# Patient Record
Sex: Female | Born: 1990 | Race: White | Hispanic: No | Marital: Single | Smoking: Never smoker
Health system: Southern US, Community
[De-identification: ages and names within clinical notes are randomized; demographics above are authoritative.]

## PROBLEM LIST (undated history)

## (undated) ENCOUNTER — Inpatient Hospital Stay (HOSPITAL_COMMUNITY): Payer: Self-pay

## (undated) DIAGNOSIS — K602 Anal fissure, unspecified: Secondary | ICD-10-CM

## (undated) DIAGNOSIS — G43909 Migraine, unspecified, not intractable, without status migrainosus: Secondary | ICD-10-CM

## (undated) DIAGNOSIS — F32A Depression, unspecified: Secondary | ICD-10-CM

## (undated) DIAGNOSIS — R110 Nausea: Secondary | ICD-10-CM

## (undated) DIAGNOSIS — F909 Attention-deficit hyperactivity disorder, unspecified type: Secondary | ICD-10-CM

## (undated) DIAGNOSIS — Z3490 Encounter for supervision of normal pregnancy, unspecified, unspecified trimester: Secondary | ICD-10-CM

## (undated) DIAGNOSIS — E669 Obesity, unspecified: Secondary | ICD-10-CM

## (undated) DIAGNOSIS — B9689 Other specified bacterial agents as the cause of diseases classified elsewhere: Secondary | ICD-10-CM

## (undated) DIAGNOSIS — R197 Diarrhea, unspecified: Secondary | ICD-10-CM

## (undated) DIAGNOSIS — Z309 Encounter for contraceptive management, unspecified: Secondary | ICD-10-CM

## (undated) DIAGNOSIS — K649 Unspecified hemorrhoids: Secondary | ICD-10-CM

## (undated) DIAGNOSIS — K219 Gastro-esophageal reflux disease without esophagitis: Secondary | ICD-10-CM

## (undated) DIAGNOSIS — Z8619 Personal history of other infectious and parasitic diseases: Secondary | ICD-10-CM

## (undated) DIAGNOSIS — M549 Dorsalgia, unspecified: Secondary | ICD-10-CM

## (undated) DIAGNOSIS — Z98891 History of uterine scar from previous surgery: Secondary | ICD-10-CM

## (undated) DIAGNOSIS — F419 Anxiety disorder, unspecified: Secondary | ICD-10-CM

## (undated) DIAGNOSIS — N760 Acute vaginitis: Secondary | ICD-10-CM

## (undated) DIAGNOSIS — F329 Major depressive disorder, single episode, unspecified: Secondary | ICD-10-CM

## (undated) DIAGNOSIS — G4730 Sleep apnea, unspecified: Secondary | ICD-10-CM

## (undated) DIAGNOSIS — N898 Other specified noninflammatory disorders of vagina: Secondary | ICD-10-CM

## (undated) DIAGNOSIS — G8929 Other chronic pain: Secondary | ICD-10-CM

## (undated) HISTORY — DX: Acute vaginitis: N76.0

## (undated) HISTORY — DX: Unspecified hemorrhoids: K64.9

## (undated) HISTORY — DX: Personal history of other infectious and parasitic diseases: Z86.19

## (undated) HISTORY — DX: Anxiety disorder, unspecified: F41.9

## (undated) HISTORY — DX: Nausea: R11.0

## (undated) HISTORY — DX: Attention-deficit hyperactivity disorder, unspecified type: F90.9

## (undated) HISTORY — PX: TONSILLECTOMY: SUR1361

## (undated) HISTORY — DX: Obesity, unspecified: E66.9

## (undated) HISTORY — DX: Gastro-esophageal reflux disease without esophagitis: K21.9

## (undated) HISTORY — PX: HERNIA REPAIR: SHX51

## (undated) HISTORY — DX: Sleep apnea, unspecified: G47.30

## (undated) HISTORY — DX: Encounter for contraceptive management, unspecified: Z30.9

## (undated) HISTORY — DX: Other specified noninflammatory disorders of vagina: N89.8

## (undated) HISTORY — DX: History of uterine scar from previous surgery: Z98.891

## (undated) HISTORY — DX: Major depressive disorder, single episode, unspecified: F32.9

## (undated) HISTORY — DX: Other specified bacterial agents as the cause of diseases classified elsewhere: B96.89

## (undated) HISTORY — DX: Diarrhea, unspecified: R19.7

## (undated) HISTORY — DX: Depression, unspecified: F32.A

## (undated) HISTORY — DX: Anal fissure, unspecified: K60.2

## (undated) HISTORY — DX: Encounter for supervision of normal pregnancy, unspecified, unspecified trimester: Z34.90

## (undated) HISTORY — PX: WISDOM TOOTH EXTRACTION: SHX21

---
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
## 2010-08-07 ENCOUNTER — Inpatient Hospital Stay (HOSPITAL_COMMUNITY)
Admission: AD | Admit: 2010-08-07 | Discharge: 2010-08-07 | Payer: Self-pay | Source: Home / Self Care | Attending: Obstetrics and Gynecology | Admitting: Obstetrics and Gynecology

## 2010-08-11 ENCOUNTER — Encounter: Payer: Self-pay | Admitting: Obstetrics & Gynecology

## 2010-08-11 ENCOUNTER — Encounter: Payer: Self-pay | Admitting: Internal Medicine

## 2010-08-18 ENCOUNTER — Inpatient Hospital Stay (HOSPITAL_COMMUNITY)
Admission: AD | Admit: 2010-08-18 | Discharge: 2010-08-18 | Payer: Self-pay | Source: Home / Self Care | Attending: Obstetrics & Gynecology | Admitting: Obstetrics & Gynecology

## 2010-08-18 LAB — URINALYSIS, ROUTINE W REFLEX MICROSCOPIC
Specific Gravity, Urine: 1.02 (ref 1.005–1.030)
Urine Glucose, Fasting: NEGATIVE mg/dL
pH: 7 (ref 5.0–8.0)

## 2010-08-18 LAB — URINE MICROSCOPIC-ADD ON

## 2010-08-21 ENCOUNTER — Ambulatory Visit (HOSPITAL_COMMUNITY)
Admission: RE | Admit: 2010-08-21 | Discharge: 2010-08-21 | Disposition: A | Discharge status: Home / Self Care | Payer: Medicaid Other | Source: Ambulatory Visit | Attending: Obstetrics and Gynecology | Admitting: Obstetrics and Gynecology

## 2010-08-21 ENCOUNTER — Other Ambulatory Visit: Payer: Self-pay | Admitting: Obstetrics and Gynecology

## 2010-08-21 ENCOUNTER — Inpatient Hospital Stay (HOSPITAL_COMMUNITY)
Admission: AD | Admit: 2010-08-21 | Discharge: 2010-08-22 | Disposition: A | Discharge status: Home / Self Care | Payer: Self-pay | Source: Ambulatory Visit | Attending: Obstetrics and Gynecology | Admitting: Obstetrics and Gynecology

## 2010-08-21 DIAGNOSIS — R52 Pain, unspecified: Secondary | ICD-10-CM

## 2010-08-21 DIAGNOSIS — N1330 Unspecified hydronephrosis: Secondary | ICD-10-CM | POA: Insufficient documentation

## 2010-08-21 DIAGNOSIS — R319 Hematuria, unspecified: Secondary | ICD-10-CM | POA: Insufficient documentation

## 2010-08-21 DIAGNOSIS — N949 Unspecified condition associated with female genital organs and menstrual cycle: Secondary | ICD-10-CM | POA: Insufficient documentation

## 2010-08-21 DIAGNOSIS — O26839 Pregnancy related renal disease, unspecified trimester: Secondary | ICD-10-CM | POA: Insufficient documentation

## 2010-08-21 LAB — BASIC METABOLIC PANEL
Calcium: 8.6 mg/dL (ref 8.4–10.5)
Creatinine, Ser: 0.44 mg/dL (ref 0.4–1.2)
GFR calc Af Amer: >60 mL/min (ref >60)

## 2010-08-21 LAB — URINALYSIS, ROUTINE W REFLEX MICROSCOPIC
Ketones, ur: NEGATIVE mg/dL
Nitrite: NEGATIVE
Specific Gravity, Urine: >1.03 — ABNORMAL HIGH (ref 1.005–1.030)
Urobilinogen, UA: 0.2 mg/dL (ref 0.0–1.0)
pH: 6 (ref 5.0–8.0)

## 2010-08-21 LAB — URINE MICROSCOPIC-ADD ON

## 2010-08-21 LAB — CBC
Platelets: 194 10*3/uL (ref 150–400)
RBC: 3.95 MIL/uL (ref 3.87–5.11)
RDW: 13.9 % (ref 11.5–15.5)
WBC: 8.9 10*3/uL (ref 4.0–10.5)

## 2010-09-23 ENCOUNTER — Inpatient Hospital Stay (HOSPITAL_COMMUNITY)
Admission: AD | Admit: 2010-09-23 | Discharge: 2010-09-27 | DRG: 765 | Disposition: A | Discharge status: Home / Self Care | Payer: Medicaid Other | Source: Ambulatory Visit | Attending: Obstetrics & Gynecology | Admitting: Obstetrics & Gynecology

## 2010-09-23 DIAGNOSIS — O4290 Premature rupture of membranes, unspecified as to length of time between rupture and onset of labor, unspecified weeks of gestation: Secondary | ICD-10-CM

## 2010-09-23 DIAGNOSIS — O411090 Infection of amniotic sac and membranes, unspecified, unspecified trimester, not applicable or unspecified: Secondary | ICD-10-CM

## 2010-09-23 LAB — CBC
HCT: 34.2 % — ABNORMAL LOW (ref 36.0–46.0)
MCH: 25.5 pg — ABNORMAL LOW (ref 26.0–34.0)
MCV: 80.9 fL (ref 78.0–100.0)
Platelets: 214 10*3/uL (ref 150–400)
RBC: 4.23 MIL/uL (ref 3.87–5.11)

## 2010-09-24 DIAGNOSIS — O4290 Premature rupture of membranes, unspecified as to length of time between rupture and onset of labor, unspecified weeks of gestation: Secondary | ICD-10-CM

## 2010-09-24 DIAGNOSIS — O411090 Infection of amniotic sac and membranes, unspecified, unspecified trimester, not applicable or unspecified: Secondary | ICD-10-CM

## 2010-09-24 LAB — URINALYSIS, ROUTINE W REFLEX MICROSCOPIC
Glucose, UA: NEGATIVE mg/dL
Specific Gravity, Urine: 1.025 (ref 1.005–1.030)
Urobilinogen, UA: 0.2 mg/dL (ref 0.0–1.0)

## 2010-09-24 LAB — URINE MICROSCOPIC-ADD ON

## 2010-09-25 ENCOUNTER — Other Ambulatory Visit: Payer: Self-pay | Admitting: Obstetrics & Gynecology

## 2010-09-25 LAB — CBC
HCT: 27.3 % — ABNORMAL LOW (ref 36.0–46.0)
MCHC: 31.1 g/dL (ref 30.0–36.0)
Platelets: 168 10*3/uL (ref 150–400)
RDW: 15.3 % (ref 11.5–15.5)
WBC: 13.3 10*3/uL — ABNORMAL HIGH (ref 4.0–10.5)

## 2010-09-27 NOTE — Op Note (Addendum)
NAME:  Sierra Heath, Sierra Heath NO.:  [id number]  MEDICAL RECORD NO.:  [id number]           PATIENT TYPE:  I  LOCATION:  9118                          FACILITY:  [facility]  PHYSICIAN:  Scheryl Darter, MD       DATE OF BIRTH:  07-09-1991  DATE OF PROCEDURE:  09/24/2010 DATE OF DISCHARGE:                              OPERATIVE REPORT   PREOPERATIVE DIAGNOSES: 1. Intrauterine pregnancy at 40 weeks and 6 days. 2. Failure to progress. 3. Chorioamnionitis. 4. Prolonged rupture of membranes.  POSTOPERATIVE DIAGNOSES: 1. Intrauterine pregnancy at 40 weeks and 6 days. 2. Failure to progress. 3. Chorioamnionitis. 4. Prolonged rupture of membranes.  PROCEDURE:  Primary low transverse cesarean section.  SURGEON:  Dr. Debroah Loop, Dr. Orvan Falconer.  ANESTHESIA:  Epidural.  IV FLUIDS:  2 L.  URINE OUTPUT:  200 mL of tinged urine at the end of the procedure due to labor.  ESTIMATED BLOOD LOSS:  800 mL.  FINDINGS:  A viable female infant with meconium-stained amniotic fluid. Normal uterus, tubes and ovaries.  Weight 8 pounds 5 ounces, Apgars 8 and 9.  SPECIMENS:  Placenta was sent to Pathology.  COMPLICATIONS:  None immediate.  INDICATIONS:  This is a 20 year old gravida 1 with an intrauterine pregnancy at 40 weeks and 6 days who initially presented with spontaneous rupture of membranes.  She was actively managed status post a Foley bulb, started on Pitocin.  After the Foley bulb was discontinued, she was 5 cm.  IUPC was placed.  The patient had a protracted labor.  Pitocin was increased as tolerated.  She progressed to 5 or 6 cm.  She remained there for over 8 hours and the patient did not change.  Her intrapartum course was complicated by chorioamnionitis with a fever of 101.3.  She was started on ampicillin and gentamicin in that regards and given Tylenol.  Due to the patient's arrest of dilatation, decision was made to proceed with a primary low transverse cesarean section for  failure to progress.  Risks, benefits and alternatives were discussed with the patient and consent was obtained and desired to proceed aforementioned procedure.  PROCEDURE IN DETAIL:  After consent was obtained, the patient was taken back to the operative suite where anesthesia was redosed.  After anesthesia was found to be adequate, the patient was placed in dorsal supine position with a leftward tilt.  The patient was prepped and draped in normal sterile fashion.  Again anesthesia was tested, was found to be adequate.  A Pfannenstiel skin incision was then made with a scalpel, carried down through to the underlying fascia.  Fascia was then incised in the midline.  The fascial incision was then extended laterally with Mayo scissors.  Superior aspect of the fascial incision was then grasped with Kocher clamps, elevated up and the rectus muscles were then dissected off bluntly.  Attention was then turned to the inferior aspect which in similar fashion was grasped with Kocher clamps, elevated up and the rectus muscles were then dissected off bluntly.  The rectus muscles were then separated in the midline and then peritoneum was then entered bluntly and an [employer]  O ring retractor was then inserted into the peritoneum.  The vesicouterine peritoneum was then identified, grasped with pickups and entered sharply with the Metzenbaum scissors.  The bladder flap was then created digitally.  Lower uterine segment was then incised in a transverse fashion with a scalpel.  The incision was extended manually.  The amniotomy was then performed with light meconium and was found to be in vertex presentation, slightly asynclitic and was delivered otherwise atraumatically.  Mouth and nose were bulb suctioned.  Cord was cut and clamped.  Infant was handed off to awaiting NICU.  Apgars were 8 and 9, weight was 8 pounds 5 ounces. Cord blood was sampled.  Placenta was then delivered spontaneously intact with  three-vessel cord.  The uterus was then cleared of all clots and debris.  The uterine incision was then closed using a two-layer closure with an 0 Vicryl initially running interlocking and the second layer was then imbricated.  Hemostasis was then found to be adequate. She did need one figure-of-eight for additional hemostasis.  Bilateral adnexa was then visualized and were found to be normal.  The peritoneum was then cleared of all clots and debris.  The incision was then revisualized and found to be hemostatic.  The Alexis O ring retractor was then removed.  The peritoneum was then closed with a 2-0 Vicryl in a running fashion.  The rectus muscles were then reapproximated using the same 2-0 Vicryl interrupted sutures.  The fascial incision was then repaired using a running stitch with 0 Vicryl.  The subcutaneous tissue was then irrigated.  Hemostasis was found to be adequate.  There were interrupted plain gut sutures that were used for reapproximation of the subcutaneous tissue.  The skin was then closed with staples.  Lap, needle and sponge counts were correct x2.  Again, the patient did receive antibiotics perioperatively due to chorioamnionitis.  She was taken back to the recovery area in stable condition.    ______________________________ Maryelizabeth Kaufmann, MD   ______________________________ Scheryl Darter, MD    LC/MEDQ  D:  09/24/2010  T:  09/25/2010  Job:  [id number]  Electronically Signed by Scheryl Darter MD on 09/26/2010 01:55:18 PM Electronically Signed by Maryelizabeth Kaufmann MD on 10/04/2010 09:34:02 AM

## 2010-10-04 NOTE — Discharge Summary (Addendum)
Sierra Heath, Sierra Heath NO.:  [id number]  MEDICAL RECORD NO.:  [id number]           PATIENT TYPE:  I  LOCATION:  9118                          FACILITY:  [facility]  PHYSICIAN:  Allie Bossier, MD        DATE OF BIRTH:  [birth year]  DATE OF ADMISSION:  09/23/2010 DATE OF DISCHARGE:  09/26/2010                              DISCHARGE SUMMARY   ADMISSION DIAGNOSES: 1. Intrauterine pregnancy of 40 weeks and 5 days. 2. Spontaneous rupture of membranes.  DISCHARGE DIAGNOSES: 1. Status post primary low transverse cesarean section for failure to     progress and arrest of dilatation. 2. Chorioamnionitis, status post antibiotics.  ATTENDING:  Allie Bossier, MD  FELLOW:  Maryelizabeth Kaufmann, MD  DISCHARGE MEDICATIONS: 1. Percocet 5/325 one tablet p.o. q.4 h. as needed. 2. Ibuprofen 600 mg 1 tablet p.o. q.6 h. 3. Colace 100 mg 1 tablet p.o. b.i.d. 4. Ferrous sulfate 325 one tablet p.o. b.i.d.  FINDINGS:  She had a primary low transverse cesarean section on September 24, 2010, with a delivery of a viable female infant with meconium-stained amniotic fluid.  Apgars were 8 and 9.  Weight was 8 pounds and 5 ounces. Normal uterus and adnexa bilaterally.  Placenta was sent to pathology.  ESTIMATED BLOOD LOSS:  500 mL.  COMPLICATIONS:  There were no complications immediately.  PERTINENT LABORATORY DATA:  Preop hemoglobin was 10.8 and 34.2. Postoperative hemoglobin 8.5 and 27.3.  HOSPITAL COURSE:  This is a 20 year old, gravida 1, para 0, who was in on the day of admission with intrauterine pregnancy at 40 weeks and 5 days, who presented with spontaneous rupture of membranes.  The patient was induced for such.  Her induction did require a Foley bulb.  Once that was discontinued, the patient was started on Pitocin for augmentation and induction.  The patient had a prolonged labor course, she stated about 5-6 cm for over 5-6 hours.  Subsequently immediately prior to delivery, she  developed a temperature of 101.3.  She was started on ampicillin for gentamicin for chorioamnionitis and given Tylenol.  Decision was made at that time due to the patient's poor cervical change.  She did have an IUPC with MVUs that were about 200s. Her Pitocin was increased to 22 mU/min and due to her poor cervical change and progression, decision was made to proceed back with a low primary C-section.  Postoperative course was benign.  Postoperative hemoglobin again as mentioned was 8.5 and 27.3.  There were no complications.  The patient was discharged home on postop day #3 in stable condition.  DISPOSITION:  Discharged to home.  DISCHARGE CONDITION:  Stable.  FOLLOWUP:  The patient is to follow up with [facility] in about 3 days for staple removal and postoperative wound check and in 6 weeks for postpartum evaluation and IUD placement.  ER WARNINGS:  The patient is to return to the emergency department if any fever, chills, nausea, vomiting, any problems with her incision such as redness, swelling, irritation, discharge, erythema, dehiscence or opening, or any other concerning symptoms.    ______________________________ Maryelizabeth Kaufmann, MD  ______________________________ Allie Bossier, MD    LC/MEDQ  D:  09/26/2010  T:  09/26/2010  Job:  [id number]  Electronically Signed by Maryelizabeth Kaufmann MD on 10/04/2010 09:34:00 AM Electronically Signed by Nicholaus Bloom MD on 10/18/2010 11:26:50 AM

## 2011-04-14 LAB — GC/CHLAMYDIA PROBE AMP, GENITAL
Chlamydia, DNA Probe: NEGATIVE
GC Probe Amp, Genital: NEGATIVE

## 2011-04-14 LAB — WET PREP, GENITAL
Trich, Wet Prep: NONE SEEN
WBC, Wet Prep HPF POC: NONE SEEN
Yeast Wet Prep HPF POC: NONE SEEN

## 2011-04-14 LAB — URINALYSIS, ROUTINE W REFLEX MICROSCOPIC
Bilirubin Urine: NEGATIVE
Glucose, UA: NEGATIVE
Leukocytes, UA: NEGATIVE
Nitrite: NEGATIVE
Specific Gravity, Urine: 1.025
Urobilinogen, UA: 1
pH: 6

## 2011-04-14 LAB — URINE MICROSCOPIC-ADD ON

## 2011-04-14 LAB — PREGNANCY, URINE: Preg Test, Ur: NEGATIVE

## 2011-06-25 ENCOUNTER — Encounter: Payer: Self-pay | Admitting: Emergency Medicine

## 2011-06-25 ENCOUNTER — Emergency Department (HOSPITAL_COMMUNITY)
Admission: EM | Admit: 2011-06-25 | Discharge: 2011-06-25 | Disposition: A | Discharge status: Home / Self Care | Payer: Medicaid Other | Attending: Emergency Medicine | Admitting: Emergency Medicine

## 2011-06-25 DIAGNOSIS — R221 Localized swelling, mass and lump, neck: Secondary | ICD-10-CM | POA: Insufficient documentation

## 2011-06-25 DIAGNOSIS — R220 Localized swelling, mass and lump, head: Secondary | ICD-10-CM | POA: Insufficient documentation

## 2011-06-25 DIAGNOSIS — K130 Diseases of lips: Secondary | ICD-10-CM | POA: Insufficient documentation

## 2011-06-25 DIAGNOSIS — T148XXA Other injury of unspecified body region, initial encounter: Secondary | ICD-10-CM

## 2011-06-25 MED ORDER — IBUPROFEN 800 MG PO TABS: 800.0000 mg | ORAL_TABLET | Freq: Three times a day (TID) | ORAL | Status: DC

## 2011-06-25 MED ORDER — DOXYCYCLINE HYCLATE 100 MG PO TABS
100.0000 mg | ORAL_TABLET | Freq: Once | ORAL | Status: AC
  Administered 2011-06-25: 100 mg via ORAL
  Filled 2011-06-25: qty 1

## 2011-06-25 MED ORDER — DOXYCYCLINE HYCLATE 100 MG PO CAPS: 100.0000 mg | ORAL_CAPSULE | Freq: Two times a day (BID) | ORAL | Status: DC

## 2011-06-25 MED ORDER — IBUPROFEN 800 MG PO TABS
800.0000 mg | ORAL_TABLET | Freq: Once | ORAL | Status: AC
  Administered 2011-06-25: 800 mg via ORAL
  Filled 2011-06-25: qty 1

## 2011-06-25 NOTE — ED Provider Notes (Signed)
History   This chart was scribed for [employer]. Colon Branch, MD by Sofie Rower. The patient was seen in room APA09/APA09 and the patient's care was started at 7:35AM.  CSN: [id number] Arrival date & time: 06/25/2011  7:28 AM   First MD Initiated Contact with Patient 06/25/11 0730      Chief Complaint  Patient presents with  . Dental Pain    Piercing    (Consider location/radiation/quality/duration/timing/severity/associated sxs/prior treatment) HPI  Sierra Heath is a 20 y.o. female who presents to the Emergency Department complaining of moderate, constant pain of her upper lip. Pt. Complains of pain located in the upper lip surrounding a piercing which she installed 9 months ago. Associated symptoms The pt. Has a history of sleep apnea.  History reviewed. No pertinent past medical history.  Past Surgical History  Procedure Date  . Tonsillectomy   . Hernia repair   . Cesarean section     History reviewed. No pertinent family history.  History  Substance Use Topics  . Smoking status: Not on file  . Smokeless tobacco: Not on file  . Alcohol Use: No    OB History    Grav Para Term Preterm Abortions TAB SAB Ect Mult Living                  Review of Systems  10 Systems reviewed and are negative for acute change except as noted in the HPI.   Allergies  Review of patient's allergies indicates no known allergies.  Home Medications  No current outpatient prescriptions on file.  BP 109/61  Pulse 101  Temp(Src) 98.8 F (37.1 C) (Oral)  Resp 18  Ht 5\' 6"  (1.676 m)  Wt 270 lb (122.471 kg)  BMI 43.58 kg/m2  SpO2 100%  LMP 06/24/2011  Physical Exam  Nursing note and vitals reviewed. Constitutional: She is oriented to person, place, and time. She appears well-developed and well-nourished. No distress.  HENT:  Head: Normocephalic and atraumatic.  Mouth/Throat: Oropharynx is clear and moist.       Piercing in the left upper lip. Slightly swollen. Pt. Asked to have the  piercing removed.  Eyes: EOM are normal. Pupils are equal, round, and reactive to light.  Neck: Normal range of motion. Neck supple.  Pulmonary/Chest: Effort normal. No respiratory distress.  Musculoskeletal: Normal range of motion.  Neurological: She is alert and oriented to person, place, and time. No sensory deficit.  Skin: Skin is warm and dry.  Psychiatric: She has a normal mood and affect. Her behavior is normal.    ED Course  Procedures (including critical care time)  Labs Reviewed - No data to display No results found.   No diagnosis found.  DIAGNOSTIC STUDIES: Oxygen Saturation is 100% on room air, normal by my interpretation.    COORDINATION OF CARE:  7:38AM EDP at bedside removes piercing and consults patient treatment plan encompassing taking an antibiotic and ibuprofen.      MDM  Patient here requesting removal of a piercing appliance to her left upper lip. Appliance removed without difficulty. Iniitated antibiotic treatment.Pt stable in ED with no significant deterioration in condition..The patient appears reasonably screened and/or stabilized for discharge and I doubt any other medical condition or other [facility] requiring further screening, evaluation, or treatment in the ED at this time prior to discharge.  I personally performed the services described in this documentation, which was scribed in my presence. The recorded information has been reviewed and considered.  MDM Reviewed: nursing note  and vitals        Nicoletta Dress. Colon Branch, MD 06/25/11 [phone]

## 2011-06-25 NOTE — ED Notes (Signed)
Pt c/o upper left lip pain from piercing.

## 2011-06-25 NOTE — ED Notes (Signed)
Pt c/o infection to facial piercing just above left lip.

## 2011-07-02 ENCOUNTER — Emergency Department (HOSPITAL_COMMUNITY): Payer: Medicaid Other

## 2011-07-02 ENCOUNTER — Encounter (HOSPITAL_COMMUNITY): Payer: Self-pay | Admitting: *Deleted

## 2011-07-02 ENCOUNTER — Emergency Department (HOSPITAL_COMMUNITY)
Admission: EM | Admit: 2011-07-02 | Discharge: 2011-07-02 | Disposition: A | Discharge status: Home / Self Care | Payer: Medicaid Other | Attending: Emergency Medicine | Admitting: Emergency Medicine

## 2011-07-02 DIAGNOSIS — Y92410 Unspecified street and highway as the place of occurrence of the external cause: Secondary | ICD-10-CM | POA: Insufficient documentation

## 2011-07-02 DIAGNOSIS — S20219A Contusion of unspecified front wall of thorax, initial encounter: Secondary | ICD-10-CM

## 2011-07-02 DIAGNOSIS — S301XXA Contusion of abdominal wall, initial encounter: Secondary | ICD-10-CM | POA: Insufficient documentation

## 2011-07-02 DIAGNOSIS — R079 Chest pain, unspecified: Secondary | ICD-10-CM | POA: Insufficient documentation

## 2011-07-02 MED ORDER — OXYCODONE-ACETAMINOPHEN 5-325 MG PO TABS
1.0000 | ORAL_TABLET | Freq: Once | ORAL | Status: AC
  Administered 2011-07-02: 1 via ORAL
  Filled 2011-07-02: qty 1

## 2011-07-02 MED ORDER — IBUPROFEN 600 MG PO TABS: 600.0000 mg | ORAL_TABLET | Freq: Four times a day (QID) | ORAL | Status: AC | PRN

## 2011-07-02 MED ORDER — OXYCODONE-ACETAMINOPHEN 5-325 MG PO TABS: 1.0000 | ORAL_TABLET | ORAL | Status: DC | PRN

## 2011-07-02 MED ORDER — OXYCODONE-ACETAMINOPHEN 5-325 MG PO TABS: 1.0000 | ORAL_TABLET | ORAL | Status: AC | PRN

## 2011-07-02 MED ORDER — IBUPROFEN 600 MG PO TABS: 600.0000 mg | ORAL_TABLET | Freq: Four times a day (QID) | ORAL | Status: DC | PRN

## 2011-07-02 NOTE — ED Notes (Signed)
Left in c/o father for transport home. Alert, in no distress.  Denies pain.
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
## 2011-07-02 NOTE — ED Notes (Signed)
RT at bedside to give incentive spirometer with instructions on use.
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
## 2011-07-12 ENCOUNTER — Emergency Department (HOSPITAL_COMMUNITY)
Admission: EM | Admit: 2011-07-12 | Discharge: 2011-07-12 | Disposition: A | Discharge status: Home / Self Care | Payer: Medicaid Other | Attending: Emergency Medicine | Admitting: Emergency Medicine

## 2011-07-12 ENCOUNTER — Encounter (HOSPITAL_COMMUNITY): Payer: Self-pay | Admitting: *Deleted

## 2011-07-12 DIAGNOSIS — M546 Pain in thoracic spine: Secondary | ICD-10-CM | POA: Insufficient documentation

## 2011-07-12 DIAGNOSIS — R220 Localized swelling, mass and lump, head: Secondary | ICD-10-CM | POA: Insufficient documentation

## 2011-07-12 DIAGNOSIS — R221 Localized swelling, mass and lump, neck: Secondary | ICD-10-CM | POA: Insufficient documentation

## 2011-07-12 DIAGNOSIS — R059 Cough, unspecified: Secondary | ICD-10-CM | POA: Insufficient documentation

## 2011-07-12 DIAGNOSIS — R079 Chest pain, unspecified: Secondary | ICD-10-CM | POA: Insufficient documentation

## 2011-07-12 DIAGNOSIS — R05 Cough: Secondary | ICD-10-CM | POA: Insufficient documentation

## 2011-07-12 DIAGNOSIS — J3489 Other specified disorders of nose and nasal sinuses: Secondary | ICD-10-CM | POA: Insufficient documentation

## 2011-07-12 MED ORDER — ALBUTEROL SULFATE HFA 108 (90 BASE) MCG/ACT IN AERS
2.0000 | INHALATION_SPRAY | Freq: Once | RESPIRATORY_TRACT | Status: AC
  Administered 2011-07-12: 2 via RESPIRATORY_TRACT
  Filled 2011-07-12: qty 6.7

## 2011-07-12 MED ORDER — GUAIFENESIN-CODEINE 100-10 MG/5ML PO SYRP: 10.0000 mL | ORAL_SOLUTION | Freq: Three times a day (TID) | ORAL | Status: AC | PRN

## 2011-07-12 MED ORDER — AZITHROMYCIN 250 MG PO TABS: ORAL_TABLET | ORAL | Status: DC

## 2011-07-12 NOTE — ED Notes (Signed)
Pt a/ox4. Resp even and unlabored. NAD at this time. D/C instructions reviewed with pt. Pt verbalized understanding. Pt ambulated to lobby with steady gate.  
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
## 2011-07-16 NOTE — ED Provider Notes (Signed)
Evaluation and management procedures were performed by the PA/NP under my supervision/collaboration.   Emanii Bugbee D Ramari Bray, MD 07/16/11 1528 

## 2011-08-26 ENCOUNTER — Emergency Department (HOSPITAL_COMMUNITY): Payer: Medicaid Other

## 2011-08-26 ENCOUNTER — Encounter (HOSPITAL_COMMUNITY): Payer: Self-pay | Admitting: *Deleted

## 2011-08-26 ENCOUNTER — Emergency Department (HOSPITAL_COMMUNITY)
Admission: EM | Admit: 2011-08-26 | Discharge: 2011-08-26 | Disposition: A | Discharge status: Home / Self Care | Payer: Medicaid Other | Attending: Emergency Medicine | Admitting: Emergency Medicine

## 2011-08-26 DIAGNOSIS — Z79899 Other long term (current) drug therapy: Secondary | ICD-10-CM | POA: Insufficient documentation

## 2011-08-26 DIAGNOSIS — R6889 Other general symptoms and signs: Secondary | ICD-10-CM | POA: Insufficient documentation

## 2011-08-26 DIAGNOSIS — R0989 Other specified symptoms and signs involving the circulatory and respiratory systems: Secondary | ICD-10-CM

## 2011-08-26 DIAGNOSIS — R1310 Dysphagia, unspecified: Secondary | ICD-10-CM | POA: Insufficient documentation

## 2011-08-26 LAB — RAPID STREP SCREEN (MED CTR MEBANE ONLY): Streptococcus, Group A Screen (Direct): NEGATIVE

## 2011-08-26 MED ORDER — IOHEXOL 300 MG/ML  SOLN
75.0000 mL | Freq: Once | INTRAMUSCULAR | Status: AC | PRN
  Administered 2011-08-26: 75 mL via INTRAVENOUS

## 2011-08-26 NOTE — ED Notes (Signed)
Pt reports waking up this am with trouble swallowing.  States feels like something is stuck in her throat, denies eating food that could get stuck.  Denies sob, denies pain.

## 2011-09-12 NOTE — ED Provider Notes (Signed)
Medical screening examination/treatment/procedure(s) were performed by non-physician practitioner and as supervising physician I was immediately available for consultation/collaboration.   Laray Anger, DO 09/12/11 1332

## 2011-09-12 NOTE — ED Provider Notes (Signed)
History     CSN: [id number]  Arrival date & time 08/26/11  0909   First MD Initiated Contact with Patient 08/26/11 1133      Chief Complaint  Patient presents with  . trouble swallowing     (Consider location/radiation/quality/duration/timing/severity/associated sxs/prior treatment) HPI Comments: Pt reports problem swallowing that started this early AM. He describes this as a sensation of something stuck in her throat. She denies any problem immediately after eating last evening. She denies eating dense meats last night. She has been able to control saliva. She is able to speak in complete sentences. She denies SOB. No hx of neck or throat surgeries. No hx of esophageal strictures.   The history is provided by the patient.    History reviewed. No pertinent past medical history.  Past Surgical History  Procedure Date  . Tonsillectomy   . Hernia repair   . Cesarean section   . Wisdom tooth extraction     No family history on file.  History  Substance Use Topics  . Smoking status: Never Smoker   . Smokeless tobacco: Not on file  . Alcohol Use: No    OB History    Grav Para Term Preterm Abortions TAB SAB Ect Mult Living                  Review of Systems  Gastrointestinal:       Difficulty swallowing.    Allergies  Review of patient's allergies indicates no known allergies.  Home Medications   Current Outpatient Rx  Name Route Sig Dispense Refill  . OMEPRAZOLE 20 MG PO CPDR Oral Take 20 mg by mouth 2 (two) times daily.      BP 111/47  Pulse 82  Temp(Src) 98.3 F (36.8 C) (Oral)  Resp 20  Ht 5\' 6"  (1.676 m)  Wt 260 lb (117.935 kg)  BMI 41.97 kg/m2  SpO2 99%  LMP 08/17/2011  Physical Exam  Nursing note and vitals reviewed. Constitutional: She is oriented to person, place, and time. She appears well-developed and well-nourished.  Non-toxic appearance.  HENT:  Head: Normocephalic.  Right Ear: Tympanic membrane and external ear normal.  Left Ear:  Tympanic membrane and external ear normal.       No swelling, redness, or fb of the posterior pharynx. Speech clear.  Eyes: EOM and lids are normal. Pupils are equal, round, and reactive to light.  Neck: Normal range of motion. Neck supple. Carotid bruit is not present.  Cardiovascular: Normal rate, regular rhythm, normal heart sounds, intact distal pulses and normal pulses.   Pulmonary/Chest: Breath sounds normal. No respiratory distress.  Abdominal: Soft. Bowel sounds are normal. There is no tenderness. There is no guarding.  Musculoskeletal: Normal range of motion.  Lymphadenopathy:       Head (right side): No submandibular adenopathy present.       Head (left side): No submandibular adenopathy present.    She has no cervical adenopathy.  Neurological: She is alert and oriented to person, place, and time. She has normal strength. No cranial nerve deficit or sensory deficit.  Skin: Skin is warm and dry.  Psychiatric: Her speech is normal. Her mood appears anxious.    ED Course  Procedures (including critical care time) Pulse Ox 99% on RA. WNL by my interpretation.  Labs Reviewed  RAPID STREP SCREEN  LAB REPORT - SCANNED   No results found.   1. Foreign body sensation in throat       MDM  I  have reviewed nursing notes, vital signs, and all appropriate lab and imaging results for this patient. Pt speaking in complete sentences. Able to drink liquids in ED. CT negative for fb or mass in the neck. Suspect GERD, anxiety, or possible scratch/trauma of the pharynx/esophagus.       Kathie Dike, [place] 09/12/11 [phone]

## 2012-01-15 ENCOUNTER — Emergency Department (HOSPITAL_COMMUNITY)
Admission: EM | Admit: 2012-01-15 | Discharge: 2012-01-15 | Disposition: A | Discharge status: Home / Self Care | Payer: Medicaid Other | Attending: Emergency Medicine | Admitting: Emergency Medicine

## 2012-01-15 ENCOUNTER — Encounter (HOSPITAL_COMMUNITY): Payer: Self-pay | Admitting: *Deleted

## 2012-01-15 DIAGNOSIS — G8929 Other chronic pain: Secondary | ICD-10-CM | POA: Insufficient documentation

## 2012-01-15 DIAGNOSIS — G43909 Migraine, unspecified, not intractable, without status migrainosus: Secondary | ICD-10-CM | POA: Insufficient documentation

## 2012-01-15 DIAGNOSIS — M549 Dorsalgia, unspecified: Secondary | ICD-10-CM | POA: Insufficient documentation

## 2012-01-15 HISTORY — DX: Dorsalgia, unspecified: M54.9

## 2012-01-15 HISTORY — DX: Other chronic pain: G89.29

## 2012-01-15 HISTORY — DX: Migraine, unspecified, not intractable, without status migrainosus: G43.909

## 2012-01-15 MED ORDER — IBUPROFEN 800 MG PO TABS: 800.0000 mg | ORAL_TABLET | Freq: Three times a day (TID) | ORAL | Status: AC

## 2012-01-15 MED ORDER — CYCLOBENZAPRINE HCL 10 MG PO TABS: 10.0000 mg | ORAL_TABLET | Freq: Three times a day (TID) | ORAL | Status: AC | PRN

## 2012-01-15 MED ORDER — IBUPROFEN 800 MG PO TABS
800.0000 mg | ORAL_TABLET | Freq: Once | ORAL | Status: AC
  Administered 2012-01-15: 800 mg via ORAL
  Filled 2012-01-15: qty 1

## 2012-01-15 MED ORDER — CYCLOBENZAPRINE HCL 10 MG PO TABS
10.0000 mg | ORAL_TABLET | Freq: Once | ORAL | Status: AC
  Administered 2012-01-15: 10 mg via ORAL
  Filled 2012-01-15: qty 1

## 2012-01-15 MED ORDER — OXYCODONE-ACETAMINOPHEN 5-325 MG PO TABS: 1.0000 | ORAL_TABLET | ORAL | Status: AC | PRN

## 2012-01-15 NOTE — ED Notes (Signed)
See triage note.

## 2012-01-15 NOTE — ED Notes (Signed)
Pt c/o lower back pain, states that she has had problems with her lower back since she had an epidural 17 months ago, pt states that she usually takes ?hydrocodeine but stopped taking them two months ago because they no longer worked. Pt also c/o migraine headache that started three days ago, lights and sound make the headache worse, pt states that she normally takes migraine medicine but has not had any in two months as well. Pt denies any n/v

## 2012-01-15 NOTE — Discharge Instructions (Signed)
Back Pain, Adult Back pain is very common. The pain often gets better over time. The cause of back pain is usually not dangerous. Most people can learn to manage their back pain on their own.  HOME CARE   Stay active. Start with short walks on flat ground if you can. Try to walk farther each day.   Do not sit, drive, or stand in one place for more than 30 minutes. Do not stay in bed.   Do not avoid exercise or work. Activity can help your back heal faster.   Be careful when you bend or lift an object. Bend at your knees, keep the object close to you, and do not twist.   Sleep on a firm mattress. Lie on your side, and bend your knees. If you lie on your back, put a pillow under your knees.   Only take medicines as told by your doctor.   Put ice on the injured area.   Put ice in a plastic bag.   Place a towel between your skin and the bag.   Leave the ice on for 15 to 20 minutes, 3 to 4 times a day for the first 2 to 3 days. After that, you can switch between ice and heat packs.   Ask your doctor about back exercises or massage.   Avoid feeling anxious or stressed. Find good ways to deal with stress, such as exercise.  GET HELP RIGHT AWAY IF:   Your pain does not go away with rest or medicine.   Your pain does not go away in 1 week.   You have new problems.   You do not feel well.   The pain spreads into your legs.   You cannot control when you poop (bowel movement) or pee (urinate).   Your arms or legs feel weak or lose feeling (numbness).   You feel sick to your stomach (nauseous) or throw up (vomit).   You have belly (abdominal) pain.   You feel like you may pass out (faint).  MAKE SURE YOU:   Understand these instructions.   Will watch your condition.   Will get help right away if you are not doing well or get worse.  Document Released: 12/24/2007 Document Revised: 06/26/2011 Document Reviewed: 11/25/2010 [facility] Patient Information 2012 [place],  [place].Chronic Back Pain When back pain lasts longer than 3 months, it is called chronic back pain.This pain can be frustrating, but the cause of the pain is rarely dangerous.People with chronic back pain often go through certain periods that are more intense (flare-ups). CAUSES Chronic back pain can be caused by wear and tear (degeneration) on different structures in your back. These structures may include bones, ligaments, or discs. This degeneration may result in more pressure being placed on the nerves that travel to your legs and feet. This can lead to pain traveling from the low back down the back of the legs. When pain lasts longer than 3 months, it is not unusual for people to experience anxiety or depression. Anxiety and depression can also contribute to low back pain. TREATMENT  Establish a regular exercise plan. This is critical to improving your functional level.   Have a self-management plan for when you flare-up. Flare-ups rarely require a medical visit. Regular exercise will help reduce the intensity and frequency of your flare-ups.   Manage how you feel about your back pain and the rest of your life. Anxiety, depression, and feeling that you cannot alter your back pain  have been shown to make back pain more intense and debilitating.   Medicines should never be your only treatment. They should be used along with other treatments to help you return to a more active lifestyle.   Procedures such as injections or surgery may be helpful but are rarely necessary. You may be able to get the same results with physical therapy or chiropractic care.  HOME CARE INSTRUCTIONS  Avoid bending, heavy lifting, prolonged sitting, and activities which make the problem worse.   Continue normal activity as much as possible.   Take brief periods of rest throughout the day to reduce your pain during flare-ups.   Follow your back exercise rehabilitation program. This can help reduce symptoms and prevent  more pain.   Only take over-the-counter or prescription medicines as directed by your caregiver. Muscle relaxants are sometimes prescribed. Narcotic pain medicine is discouraged for long-term pain, since addiction is a possible outcome.   If you smoke, quit.   Eat healthy foods and maintain a recommended body weight.  SEEK IMMEDIATE MEDICAL CARE IF:   You have weakness or numbness in one of your legs or feet.   You have trouble controlling your bladder or bowels.   You develop nausea, vomiting, abdominal pain, shortness of breath, or fainting.  Document Released: 08/14/2004 Document Revised: 06/26/2011 Document Reviewed: 06/21/2011 [facility] Patient Information 2012 [place], [place].

## 2012-01-15 NOTE — ED Provider Notes (Signed)
History     CSN: [id number]  Arrival date & time 01/15/12  1052   First MD Initiated Contact with Patient 01/15/12 1125      Chief Complaint  Patient presents with  . Back Pain  . Migraine    (Consider location/radiation/quality/duration/timing/severity/associated sxs/prior treatment) Patient is a 21 y.o. female presenting with back pain and migraine. The history is provided by the patient.  Back Pain  This is a chronic problem. The current episode started more than 1 week ago. The problem occurs constantly. The problem has been gradually worsening. The pain is associated with no known injury. The pain is present in the lumbar spine. The quality of the pain is described as aching. The pain does not radiate. The pain is moderate. The symptoms are aggravated by twisting, certain positions and bending. Associated symptoms include headaches. Pertinent negatives include no chest pain, no fever, no numbness, no abdominal pain, no abdominal swelling, no bowel incontinence, no perianal numbness, no bladder incontinence, no dysuria, no pelvic pain, no leg pain, no paresthesias, no paresis, no tingling and no weakness. Treatments tried: oral narcotics. The treatment provided mild relief.  Migraine This is a new problem. The current episode started in the past 7 days. The problem occurs constantly. The problem has been gradually worsening. Associated symptoms include headaches and nausea. Pertinent negatives include no abdominal pain, chest pain, fatigue, fever, joint swelling, neck pain, numbness, rash, vertigo, visual change, vomiting or weakness. Exacerbated by: bright lights and sound. She has tried nothing (patient states she has hx of migraines and ususally takes migraine medication, but ran out two months ago) for the symptoms. The treatment provided no relief.    Past Medical History  Diagnosis Date  . Migraine   . Back pain, chronic     Past Surgical History  Procedure Date  .  Tonsillectomy   . Hernia repair   . Cesarean section   . Wisdom tooth extraction     No family history on file.  History  Substance Use Topics  . Smoking status: Never Smoker   . Smokeless tobacco: Not on file  . Alcohol Use: No    OB History    Grav Para Term Preterm Abortions TAB SAB Ect Mult Living                  Review of Systems  Constitutional: Negative for fever, activity change, appetite change and fatigue.  HENT: Negative for neck pain and neck stiffness.   Eyes: Positive for photophobia. Negative for visual disturbance.  Respiratory: Negative for shortness of breath.   Cardiovascular: Negative for chest pain.  Gastrointestinal: Positive for nausea. Negative for vomiting, abdominal pain, constipation and bowel incontinence.  Genitourinary: Negative for bladder incontinence, dysuria, hematuria, flank pain, decreased urine volume, difficulty urinating and pelvic pain.       Perineal numbness or incontinence of urine or feces  Musculoskeletal: Positive for back pain. Negative for joint swelling.  Skin: Negative for rash.  Neurological: Positive for headaches. Negative for dizziness, vertigo, tingling, facial asymmetry, weakness, numbness and paresthesias.  All other systems reviewed and are negative.    Allergies  Review of patient's allergies indicates no known allergies.  Home Medications   Current Outpatient Rx  Name Route Sig Dispense Refill  . OMEPRAZOLE 20 MG PO CPDR Oral Take 20 mg by mouth daily.       BP 123/64  Pulse 95  Temp 98.2 F (36.8 C)  Resp 20  Ht 5\' 6"  (  1.676 m)  Wt 260 lb (117.935 kg)  BMI 41.97 kg/m2  SpO2 100%  LMP 12/20/2011  Physical Exam  Nursing note and vitals reviewed. Constitutional: She is oriented to person, place, and time. She appears well-developed and well-nourished. No distress.  HENT:  Head: Normocephalic and atraumatic.  Mouth/Throat: Oropharynx is clear and moist.  Eyes: EOM are normal. Pupils are equal,  round, and reactive to light.  Neck: Normal range of motion. Neck supple. No Brudzinski's sign and no Kernig's sign noted.  Cardiovascular: Normal rate, regular rhythm and intact distal pulses.   No murmur heard. Pulmonary/Chest: Effort normal and breath sounds normal.  Musculoskeletal: She exhibits tenderness. She exhibits no edema.       Lumbar back: She exhibits tenderness and pain. She exhibits normal range of motion, no swelling, no deformity, no laceration and normal pulse.       Back:  Neurological: She is alert and oriented to person, place, and time. No cranial nerve deficit or sensory deficit. She exhibits normal muscle tone. Coordination and gait normal.  Reflex Scores:      Patellar reflexes are 2+ on the right side and 2+ on the left side.      Achilles reflexes are 2+ on the right side and 2+ on the left side. Skin: Skin is warm and dry.    ED Course  Procedures (including critical care time)  Labs Reviewed - No data to display     MDM    Patient has ttp of the lumbar paraspinal muscles.  No spinal tenderness.  No focal neuro deficits on exam.  Ambulates with a steady gait.     Vitals stable,  Pt is non-toxic appearing.  No focal neuro deficits, no meningeal signs.  Headache of gradual onset that is similar to previous.  Patient agrees to close f/u with PMD or to return here if the symptoms worsen.     Patient / Family / Caregiver understand and agree with initial ED impression and plan with expectations set for ED visit. Pt stable in ED with no significant deterioration in condition. Pt feels improved after observation and/or treatment in ED.    Prescribed:  Flexeril Ibuprofen Percocet # 12    Tatym Schermer L. [place], [place] 01/18/12 [phone]

## 2012-01-18 NOTE — ED Provider Notes (Signed)
Medical screening examination/treatment/procedure(s) were performed by non-physician practitioner and as supervising physician I was immediately available for consultation/collaboration.  Kathryn Cosby, MD, FACEP   Antwyne Pingree L Vondra Aldredge, MD 01/18/12 1558 

## 2012-01-28 ENCOUNTER — Encounter (HOSPITAL_COMMUNITY): Payer: Self-pay | Admitting: *Deleted

## 2012-01-28 ENCOUNTER — Emergency Department (HOSPITAL_COMMUNITY)
Admission: EM | Admit: 2012-01-28 | Discharge: 2012-01-28 | Disposition: A | Discharge status: Home / Self Care | Payer: Medicaid Other | Attending: Emergency Medicine | Admitting: Emergency Medicine

## 2012-01-28 DIAGNOSIS — G8929 Other chronic pain: Secondary | ICD-10-CM | POA: Insufficient documentation

## 2012-01-28 DIAGNOSIS — X58XXXA Exposure to other specified factors, initial encounter: Secondary | ICD-10-CM | POA: Insufficient documentation

## 2012-01-28 DIAGNOSIS — S00412A Abrasion of left ear, initial encounter: Secondary | ICD-10-CM

## 2012-01-28 DIAGNOSIS — M549 Dorsalgia, unspecified: Secondary | ICD-10-CM | POA: Insufficient documentation

## 2012-01-28 DIAGNOSIS — IMO0002 Reserved for concepts with insufficient information to code with codable children: Secondary | ICD-10-CM | POA: Insufficient documentation

## 2012-01-28 MED ORDER — BACITRACIN ZINC 500 UNIT/GM EX OINT
TOPICAL_OINTMENT | CUTANEOUS | Status: AC
  Filled 2012-01-28: qty 0.9

## 2012-01-28 MED ORDER — BACITRACIN ZINC 500 UNIT/GM EX OINT
1.0000 "application " | TOPICAL_OINTMENT | Freq: Two times a day (BID) | CUTANEOUS | Status: DC
  Administered 2012-01-28: 1 via TOPICAL

## 2012-01-28 NOTE — ED Notes (Signed)
Pt c/o left ear being sore in the upper part of her ear for past couple of days.  Also reports has had pain in center of back since having had epidural 17months ago.  Says was taking percocet for her back pain but ran out about a month ago.

## 2012-01-28 NOTE — ED Notes (Signed)
Pt in BR when called 

## 2012-01-28 NOTE — ED Provider Notes (Signed)
History     CSN: [id number]  Arrival date & time 01/28/12  1113   None     Chief Complaint  Patient presents with  . Otalgia    (Consider location/radiation/quality/duration/timing/severity/associated sxs/prior treatment) HPI Comments: Scratched L ear 2 days ago with finger nail.  Patient is a 21 y.o. female presenting with ear pain. The history is provided by the patient. No language interpreter was used.  Otalgia This is a new problem. The current episode started 2 days ago. There is pain in the left ear. The problem occurs constantly. The problem has not changed since onset.There has been no fever. The pain is mild.    Past Medical History  Diagnosis Date  . Migraine   . Back pain, chronic     Past Surgical History  Procedure Date  . Tonsillectomy   . Hernia repair   . Cesarean section   . Wisdom tooth extraction     Family History  Problem Relation Age of Onset  . Cancer Father   . Diabetes Father     History  Substance Use Topics  . Smoking status: Never Smoker   . Smokeless tobacco: Not on file  . Alcohol Use: No    OB History    Grav Para Term Preterm Abortions TAB SAB Ect Mult Living                  Review of Systems  Constitutional: Negative for fever.  HENT: Positive for ear pain.   All other systems reviewed and are negative.    Allergies  Review of patient's allergies indicates no known allergies.  Home Medications   Current Outpatient Rx  Name Route Sig Dispense Refill  . OMEPRAZOLE 20 MG PO CPDR Oral Take 20 mg by mouth daily.       BP 105/50  Pulse 79  Temp 97.9 F (36.6 C) (Oral)  Resp 18  Ht 5\' 6"  (1.676 m)  Wt 250 lb (113.399 kg)  BMI 40.35 kg/m2  SpO2 100%  LMP 01/21/2012  Physical Exam  Nursing note and vitals reviewed. Constitutional: She is oriented to person, place, and time. She appears well-developed and well-nourished. No distress.  HENT:  Head: Normocephalic and atraumatic.  Right Ear: Hearing,  tympanic membrane, external ear and ear canal normal.  Left Ear: Hearing, tympanic membrane and ear canal normal.  Ears:  Eyes: EOM are normal.  Neck: Normal range of motion.  Cardiovascular: Normal rate, regular rhythm and normal heart sounds.   Pulmonary/Chest: Effort normal and breath sounds normal.  Abdominal: Soft. She exhibits no distension. There is no tenderness.  Musculoskeletal: Normal range of motion.  Neurological: She is alert and oriented to person, place, and time.  Skin: Skin is warm and dry.  Psychiatric: She has a normal mood and affect. Judgment normal.    ED Course  Procedures (including critical care time)  Labs Reviewed - No data to display No results found.   1. Abrasion of left ear       MDM  Wash abrasion BID/abx ointment.   F/u with PCP prn         Evalina Field, PA 01/28/12 1200

## 2012-01-28 NOTE — ED Notes (Signed)
Has red area to external ear., and  Back pain since  Had epidural for  Child birth 17 mos ago

## 2012-01-29 NOTE — ED Provider Notes (Signed)
Medical screening examination/treatment/procedure(s) were performed by non-physician practitioner and as supervising physician I was immediately available for consultation/collaboration.   Shelda Jakes, MD 01/29/12 [phone]

## 2012-02-24 ENCOUNTER — Emergency Department (HOSPITAL_COMMUNITY)
Admission: EM | Admit: 2012-02-24 | Discharge: 2012-02-24 | Disposition: A | Discharge status: Home / Self Care | Payer: Medicaid Other | Attending: Emergency Medicine | Admitting: Emergency Medicine

## 2012-02-24 ENCOUNTER — Encounter (HOSPITAL_COMMUNITY): Payer: Self-pay

## 2012-02-24 DIAGNOSIS — Y998 Other external cause status: Secondary | ICD-10-CM | POA: Insufficient documentation

## 2012-02-24 DIAGNOSIS — G8929 Other chronic pain: Secondary | ICD-10-CM | POA: Insufficient documentation

## 2012-02-24 DIAGNOSIS — S335XXA Sprain of ligaments of lumbar spine, initial encounter: Secondary | ICD-10-CM | POA: Insufficient documentation

## 2012-02-24 DIAGNOSIS — S39012A Strain of muscle, fascia and tendon of lower back, initial encounter: Secondary | ICD-10-CM

## 2012-02-24 DIAGNOSIS — Y9389 Activity, other specified: Secondary | ICD-10-CM | POA: Insufficient documentation

## 2012-02-24 DIAGNOSIS — X500XXA Overexertion from strenuous movement or load, initial encounter: Secondary | ICD-10-CM | POA: Insufficient documentation

## 2012-02-24 MED ORDER — DIAZEPAM 5 MG PO TABS: 5.0000 mg | ORAL_TABLET | Freq: Three times a day (TID) | ORAL | Status: AC | PRN

## 2012-02-24 MED ORDER — HYDROCODONE-ACETAMINOPHEN 5-325 MG PO TABS: 1.0000 | ORAL_TABLET | ORAL | Status: AC | PRN

## 2012-02-24 NOTE — ED Notes (Signed)
Pt c/o lower back pain x1 day. Pt states she is in the process of moving and has been doing a lot of heavy lifting. Pt states pain is worse with movement.

## 2012-02-24 NOTE — ED Notes (Signed)
Low back pain since yesterday. Has been moving furniture

## 2012-02-28 NOTE — ED Provider Notes (Signed)
History     CSN: [id number]  Arrival date & time 02/24/12  1331   First MD Initiated Contact with Patient 02/24/12 1400      Chief Complaint  Patient presents with  . Back Pain    (Consider location/radiation/quality/duration/timing/severity/associated sxs/prior treatment) HPI Comments: Sierra Heath presents with acute on chronic low back pain which has which has been present since helping move furniture yesterday.  She denies sudden onset of pain,  But instead pain that developed later in the day while resting.  There is no radiation of pain.  There has been no weakness or numbness in the lower extremities and no urinary or bowel retention or incontinence.  Patient does not have a history of cancer or IVDU.   The history is provided by the patient.    Past Medical History  Diagnosis Date  . Migraine   . Back pain, chronic     Past Surgical History  Procedure Date  . Tonsillectomy   . Hernia repair   . Cesarean section   . Wisdom tooth extraction     Family History  Problem Relation Age of Onset  . Cancer Father   . Diabetes Father     History  Substance Use Topics  . Smoking status: Never Smoker   . Smokeless tobacco: Not on file  . Alcohol Use: No    OB History    Grav Para Term Preterm Abortions TAB SAB Ect Mult Living                  Review of Systems  Constitutional: Negative for fever.  Respiratory: Negative for shortness of breath.   Cardiovascular: Negative for chest pain and leg swelling.  Gastrointestinal: Negative for abdominal pain, constipation and abdominal distention.  Genitourinary: Negative for dysuria, urgency, frequency, flank pain and difficulty urinating.  Musculoskeletal: Positive for back pain. Negative for joint swelling and gait problem.  Skin: Negative for rash.  Neurological: Negative for weakness and numbness.    Allergies  Review of patient's allergies indicates no known allergies.  Home Medications   Current  Outpatient Rx  Name Route Sig Dispense Refill  . OMEPRAZOLE 20 MG PO CPDR Oral Take 20 mg by mouth 2 (two) times daily.     Marland Kitchen DIAZEPAM 5 MG PO TABS Oral Take 1 tablet (5 mg total) by mouth every 8 (eight) hours as needed (for muscle spasm). 12 tablet 0  . HYDROCODONE-ACETAMINOPHEN 5-325 MG PO TABS Oral Take 1 tablet by mouth every 4 (four) hours as needed for pain. 15 tablet 0    BP 113/45  Pulse 87  Temp 98.3 F (36.8 C) (Oral)  Resp 18  Ht 5\' 6"  (1.676 m)  Wt 250 lb (113.399 kg)  BMI 40.35 kg/m2  SpO2 100%  LMP 02/13/2012  Physical Exam  Nursing note and vitals reviewed. Constitutional: She appears well-developed and well-nourished.  HENT:  Head: Normocephalic.  Eyes: Conjunctivae are normal.  Neck: Normal range of motion. Neck supple.  Cardiovascular: Normal rate and intact distal pulses.        Pedal pulses normal.  Pulmonary/Chest: Effort normal.  Abdominal: Soft. Bowel sounds are normal. She exhibits no distension and no mass.  Musculoskeletal: Normal range of motion. She exhibits no edema.       Lumbar back: She exhibits tenderness and spasm. She exhibits no bony tenderness, no swelling and no edema.  Neurological: She is alert. She has normal strength. She displays no atrophy and no tremor. No sensory  deficit. Gait normal.  Reflex Scores:      Patellar reflexes are 2+ on the right side and 2+ on the left side.      Achilles reflexes are 2+ on the right side and 2+ on the left side.      No strength deficit noted in hip and knee flexor and extensor muscle groups.  Ankle flexion and extension intact.  Skin: Skin is warm and dry.  Psychiatric: She has a normal mood and affect.    ED Course  Procedures (including critical care time)  Labs Reviewed - No data to display No results found.   1. Strain of lumbar paraspinal muscle       MDM  Prescribed valium for muscle spasm, hydrocodone.  Recommended ice,  Rest,  Recheck by pcp if not improved over the next  several days.  No neuro deficit on exam or by history to suggest emergent or surgical presentation.  Also discussed worsened sx that should prompt immediate re-evaluation including distal weakness, bowel/bladder retention/incontinence.              Burgess Amor, PA 02/28/12 1550

## 2012-02-29 NOTE — ED Provider Notes (Signed)
Medical screening examination/treatment/procedure(s) were performed by non-physician practitioner and as supervising physician I was immediately available for consultation/collaboration.   Charles B. Bernette Mayers, MD 02/29/12 1610

## 2012-04-21 ENCOUNTER — Emergency Department (HOSPITAL_COMMUNITY)
Admission: EM | Admit: 2012-04-21 | Discharge: 2012-04-21 | Disposition: A | Discharge status: Home / Self Care | Payer: Medicaid Other | Attending: Emergency Medicine | Admitting: Emergency Medicine

## 2012-04-21 ENCOUNTER — Encounter (HOSPITAL_COMMUNITY): Payer: Self-pay | Admitting: *Deleted

## 2012-04-21 DIAGNOSIS — S91209A Unspecified open wound of unspecified toe(s) with damage to nail, initial encounter: Secondary | ICD-10-CM

## 2012-04-21 DIAGNOSIS — Z23 Encounter for immunization: Secondary | ICD-10-CM | POA: Insufficient documentation

## 2012-04-21 DIAGNOSIS — S91109A Unspecified open wound of unspecified toe(s) without damage to nail, initial encounter: Secondary | ICD-10-CM | POA: Insufficient documentation

## 2012-04-21 DIAGNOSIS — IMO0002 Reserved for concepts with insufficient information to code with codable children: Secondary | ICD-10-CM | POA: Insufficient documentation

## 2012-04-21 MED ORDER — TETANUS-DIPHTH-ACELL PERTUSSIS 5-2.5-18.5 LF-MCG/0.5 IM SUSP
0.5000 mL | Freq: Once | INTRAMUSCULAR | Status: AC
  Administered 2012-04-21: 0.5 mL via INTRAMUSCULAR
  Filled 2012-04-21: qty 0.5

## 2012-04-21 MED ORDER — AMOXICILLIN 500 MG PO CAPS: ORAL_CAPSULE | ORAL | Status: DC

## 2012-04-21 MED ORDER — BACITRACIN-NEOMYCIN-POLYMYXIN 400-5-5000 EX OINT
TOPICAL_OINTMENT | Freq: Once | CUTANEOUS | Status: AC
  Administered 2012-04-21: 14:00:00 via TOPICAL
  Filled 2012-04-21: qty 1

## 2012-04-21 MED ORDER — BACITRACIN ZINC 500 UNIT/GM EX OINT
TOPICAL_OINTMENT | CUTANEOUS | Status: AC
  Filled 2012-04-21: qty 0.9

## 2012-04-21 MED ORDER — MELOXICAM 7.5 MG PO TABS: ORAL_TABLET | ORAL | Status: DC

## 2012-04-21 NOTE — ED Notes (Signed)
Pt presents to ED after injuring her left big toe 2 days ago. Pt states she hit her toe on edge of table and her toenail ripped off. Pt is able to move toe and no swelling or deformity is noted on assessment. Pt states she is worried about tie becoming infected.

## 2012-04-21 NOTE — ED Notes (Signed)
Pt states she knocked left great toe nail off 2 days ago. NAD.

## 2012-04-21 NOTE — ED Provider Notes (Signed)
History     CSN: [id number]  Arrival date & time 04/21/12  1217   First MD Initiated Contact with Patient 04/21/12 1251      Chief Complaint  Patient presents with  . Toe Injury    (Consider location/radiation/quality/duration/timing/severity/associated sxs/prior treatment) HPI Comments: Patient states she was attempting to move a heavy object with her left first toe. She accidentally" knocked off the toenail of the great toe". This happened Sept 30th. Pt states "it just looks strange". Patient has some pain with walking. No significant drainage. No fever or chills related to the injury. She request evaluation.  The history is provided by the patient.    Past Medical History  Diagnosis Date  . Migraine   . Back pain, chronic     Past Surgical History  Procedure Date  . Tonsillectomy   . Hernia repair   . Cesarean section   . Wisdom tooth extraction     Family History  Problem Relation Age of Onset  . Cancer Father   . Diabetes Father     History  Substance Use Topics  . Smoking status: Never Smoker   . Smokeless tobacco: Not on file  . Alcohol Use: No    OB History    Grav Para Term Preterm Abortions TAB SAB Ect Mult Living                  Review of Systems  Constitutional: Negative for activity change.       All ROS Neg except as noted in HPI  HENT: Negative for nosebleeds and neck pain.   Eyes: Negative for photophobia and discharge.  Respiratory: Negative for cough, shortness of breath and wheezing.   Cardiovascular: Negative for chest pain and palpitations.  Gastrointestinal: Negative for abdominal pain and blood in stool.  Genitourinary: Negative for dysuria, frequency and hematuria.  Musculoskeletal: Positive for back pain. Negative for arthralgias.  Skin: Negative.   Neurological: Positive for headaches. Negative for dizziness, seizures and speech difficulty.  Psychiatric/Behavioral: Negative for hallucinations and confusion.    Allergies    Review of patient's allergies indicates no known allergies.  Home Medications   Current Outpatient Rx  Name Route Sig Dispense Refill  . AMOXICILLIN 500 MG PO CAPS  2 po bid with food 20 capsule 0  . MELOXICAM 7.5 MG PO TABS  1 po bid with food 12 tablet 0  . OMEPRAZOLE 20 MG PO CPDR Oral Take 20 mg by mouth 2 (two) times daily.       BP 111/56  Pulse 89  Temp 98.3 F (36.8 C) (Oral)  Resp 20  Ht 5\' 6"  (1.676 m)  Wt 260 lb (117.935 kg)  BMI 41.97 kg/m2  SpO2 100%  LMP 04/14/2012  Physical Exam  Nursing note and vitals reviewed. Constitutional: She is oriented to person, place, and time. She appears well-developed and well-nourished.  Non-toxic appearance.  HENT:  Head: Normocephalic.  Right Ear: Tympanic membrane and external ear normal.  Left Ear: Tympanic membrane and external ear normal.  Eyes: EOM and lids are normal. Pupils are equal, round, and reactive to light.  Neck: Normal range of motion. Neck supple. Carotid bruit is not present.  Cardiovascular: Normal rate, regular rhythm, normal heart sounds, intact distal pulses and normal pulses.   Pulmonary/Chest: Breath sounds normal. No respiratory distress.  Abdominal: Soft. Bowel sounds are normal. There is no tenderness. There is no guarding.  Musculoskeletal: Normal range of motion.  The toenail of the left first toe is no falls. There is a laceration of the nail bed. There is no red streaking of the toe with the foot. There is full range of motion of all toes. Sensory is intact. Achilles tendon is also intact.  Lymphadenopathy:       Head (right side): No submandibular adenopathy present.       Head (left side): No submandibular adenopathy present.    She has no cervical adenopathy.  Neurological: She is alert and oriented to person, place, and time. She has normal strength. No cranial nerve deficit or sensory deficit.  Skin: Skin is warm and dry.  Psychiatric: She has a normal mood and affect. Her speech is  normal.    ED Course  Procedures (including critical care time)  Labs Reviewed - No data to display No results found.   1. Nail avulsion, toe       MDM  I have reviewed nursing notes, vital signs, and all appropriate lab and imaging results for this patient. Patient avulsed the nail from the first left toe 2 days ago. The patient is treated with antibiotic Amoxil 2 tablets 2 times daily with food, Mobic 7.5 mg twice a day for swelling and pain. The patient is to cleanse the area with soap and water daily and apply a dressing. A postop shoe has been provided. The patient's tetanus is updated.       Kathie Dike, [place] 04/21/12 1400

## 2012-04-21 NOTE — ED Provider Notes (Signed)
Medical screening examination/treatment/procedure(s) were performed by non-physician practitioner and as supervising physician I was immediately available for consultation/collaboration.   Leyani Gargus, MD 04/21/12 1536 

## 2012-08-08 ENCOUNTER — Encounter (HOSPITAL_COMMUNITY): Payer: Self-pay | Admitting: *Deleted

## 2012-08-08 ENCOUNTER — Emergency Department (HOSPITAL_COMMUNITY)
Admission: EM | Admit: 2012-08-08 | Discharge: 2012-08-08 | Disposition: A | Discharge status: Home / Self Care | Payer: Medicaid Other | Attending: Emergency Medicine | Admitting: Emergency Medicine

## 2012-08-08 DIAGNOSIS — M545 Low back pain, unspecified: Secondary | ICD-10-CM | POA: Insufficient documentation

## 2012-08-08 DIAGNOSIS — G8929 Other chronic pain: Secondary | ICD-10-CM

## 2012-08-08 DIAGNOSIS — G43909 Migraine, unspecified, not intractable, without status migrainosus: Secondary | ICD-10-CM | POA: Insufficient documentation

## 2012-08-08 DIAGNOSIS — Z79899 Other long term (current) drug therapy: Secondary | ICD-10-CM | POA: Insufficient documentation

## 2012-08-08 MED ORDER — METHOCARBAMOL 500 MG PO TABS: 1000.0000 mg | ORAL_TABLET | Freq: Four times a day (QID) | ORAL | Status: DC | PRN

## 2012-08-08 MED ORDER — DIPHENHYDRAMINE HCL 50 MG/ML IJ SOLN
50.0000 mg | Freq: Once | INTRAMUSCULAR | Status: AC
  Administered 2012-08-08: 50 mg via INTRAMUSCULAR
  Filled 2012-08-08: qty 1

## 2012-08-08 MED ORDER — KETOROLAC TROMETHAMINE 60 MG/2ML IM SOLN
60.0000 mg | Freq: Once | INTRAMUSCULAR | Status: AC
  Administered 2012-08-08: 60 mg via INTRAMUSCULAR
  Filled 2012-08-08: qty 2

## 2012-08-08 MED ORDER — NAPROXEN 250 MG PO TABS: 250.0000 mg | ORAL_TABLET | Freq: Two times a day (BID) | ORAL | Status: DC

## 2012-08-08 MED ORDER — METOCLOPRAMIDE HCL 5 MG/ML IJ SOLN
10.0000 mg | Freq: Once | INTRAMUSCULAR | Status: AC
  Administered 2012-08-08: 10 mg via INTRAMUSCULAR
  Filled 2012-08-08: qty 2

## 2012-08-08 NOTE — ED Provider Notes (Signed)
History     CSN: [id number]  Arrival date & time 08/08/12  1532   First MD Initiated Contact with Patient 08/08/12 1610      Chief Complaint  Patient presents with  . Migraine     HPI Pt was seen at 1630.  Per pt, c/o gradual onset and persistence of constant acute flair of her chronic migraine headache for the past 4 days.  Describes the headache as per her usual chronic migraine headache pain pattern for many years.  Denies headache was sudden or maximal in onset or at any time.  Denies visual changes, no focal motor weakness, no tingling/numbness in extremities, no fevers, no neck pain, no rash.  Pt also, c/o gradual onset and persistence of constant acute flair of her chronic low back "pain" for the past several days.  Denies any change in her usual chronic pain pattern.  Pain worsens with palpation of the area and body position changes. Denies incont/retention of bowel or bladder, no saddle anesthesia, no focal motor weakness, no tingling/numbness in extremities, no fevers, no injury, no abd pain.   The symptoms have been associated with no other complaints. The patient has a significant history of similar symptoms previously, recently being evaluated for this complaint and multiple prior evals for same.       Past Medical History  Diagnosis Date  . Migraine   . Back pain, chronic     Past Surgical History  Procedure Date  . Tonsillectomy   . Hernia repair   . Cesarean section   . Wisdom tooth extraction     Family History  Problem Relation Age of Onset  . Cancer Father   . Diabetes Father     History  Substance Use Topics  . Smoking status: Never Smoker   . Smokeless tobacco: Not on file  . Alcohol Use: No    Review of Systems ROS: Statement: All systems negative except as marked or noted in the HPI; Constitutional: Negative for fever and chills. ; ; Eyes: Negative for eye pain, redness and discharge. ; ; ENMT: Negative for ear pain, hoarseness, nasal congestion,  sinus pressure and sore throat. ; ; Cardiovascular: Negative for chest pain, palpitations, diaphoresis, dyspnea and peripheral edema. ; ; Respiratory: Negative for cough, wheezing and stridor. ; ; Gastrointestinal: Negative for nausea, vomiting, diarrhea, abdominal pain, blood in stool, hematemesis, jaundice and rectal bleeding. . ; ; Genitourinary: Negative for dysuria, flank pain and hematuria. ; ; Musculoskeletal: +LBP. Negative for neck pain. Negative for swelling and trauma.; ; Skin: Negative for pruritus, rash, abrasions, blisters, bruising and skin lesion.; ; Neuro: +migraine headache. Negative for lightheadedness and neck stiffness. Negative for weakness, altered level of consciousness , altered mental status, extremity weakness, paresthesias, involuntary movement, seizure and syncope.     Allergies  Review of patient's allergies indicates no known allergies.  Home Medications   Current Outpatient Rx  Name  Route  Sig  Dispense  Refill  . OMEPRAZOLE 20 MG PO CPDR   Oral   Take 20 mg by mouth 2 (two) times daily.            BP 113/78  Pulse 95  Temp 98.2 F (36.8 C) (Oral)  Resp 18  Ht 5\' 6"  (1.676 m)  Wt 230 lb (104.327 kg)  BMI 37.12 kg/m2  SpO2 95%  LMP 08/01/2012  Physical Exam 1635: Physical examination:  Nursing notes reviewed; Vital signs and O2 SAT reviewed;  Constitutional: Well developed, Well nourished, Well  hydrated, In no acute distress; Head:  Normocephalic, atraumatic; Eyes: EOMI, PERRL, No scleral icterus; ENMT: Mouth and pharynx normal, Mucous membranes moist; Neck: Supple, Full range of motion, No lymphadenopathy; Cardiovascular: Regular rate and rhythm, No murmur, rub, or gallop; Respiratory: Breath sounds clear & equal bilaterally, No rales, rhonchi, wheezes.  Speaking full sentences with ease, Normal respiratory effort/excursion; Chest: Nontender, Movement normal; Abdomen: Soft, Nontender, Nondistended, Normal bowel sounds; Genitourinary: No CVA tenderness;  Spine:  No midline CS, TS, LS tenderness.  +TTP left lumbar paraspinal muscles.;; Extremities: Pulses normal, No tenderness, No edema, No calf edema or asymmetry.; Neuro: AA&Ox3, Major CN grossly intact.  Speech clear. No facial droop. Strength 5/5 equal bilat UE's and LE's, including great toe dorsiflexion.  DTR 2/4 equal bilat UE's and LE's.  No gross sensory deficits.  Neg straight leg raises bilat.;.; Skin: Color normal, Warm, Dry.   ED Course  Procedures     MDM  MDM Reviewed: nursing note, vitals and previous chart     1715:  Long hx of chronic pain with multiple ED visits for same.  Pt endorses acute flair of her usual long standing chronic pain today, no change from her usual chronic pain pattern.  Pt encouraged to f/u with her PMD and Pain Management doctor for good continuity of care and control of her chronic pain.  Verb understanding.         Laray Anger, DO 08/10/12 1349

## 2012-08-08 NOTE — ED Notes (Signed)
Po fluids given

## 2012-08-08 NOTE — ED Notes (Signed)
Migraine x 4 days with light/sound sensitivity.

## 2012-09-13 IMAGING — CT CT NECK W/ CM
3 of 4 series · 14 of 33 positions shown, 17 images · IV contrast (Omnipaque 300)
Comparison: None.

CLINICAL DATA: 20-year-old female with difficulty swallowing,
sensation of mass in throat.

CT NECK WITH CONTRAST
TECHNIQUE: Multidetector CT imaging of the neck was performed with
intravenous contrast.
Contrast: 75mL OMNIPAQUE IOHEXOL 300 MG/ML IV SOLN

[Series 2: soft tissue neck 3.0 b31s · axial · 0.33mm/px · z∈[-15,+141]mm · 6 of 66 slices shown, 8 images]
[im 7/66  soft-tissue]
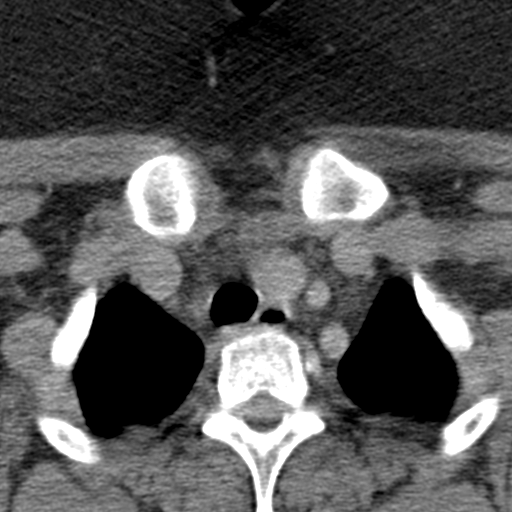
[im 7/66  bone]
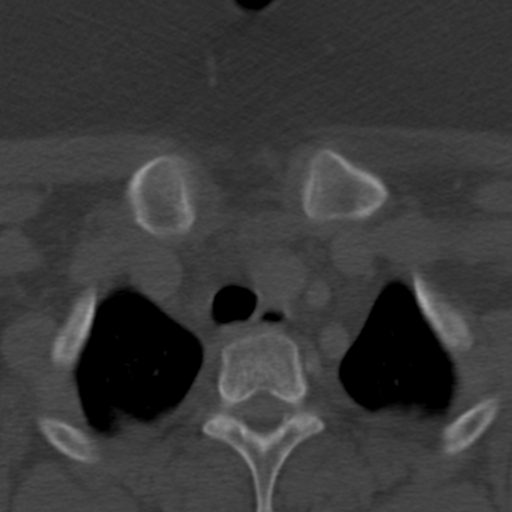
[im 20/66  bone]
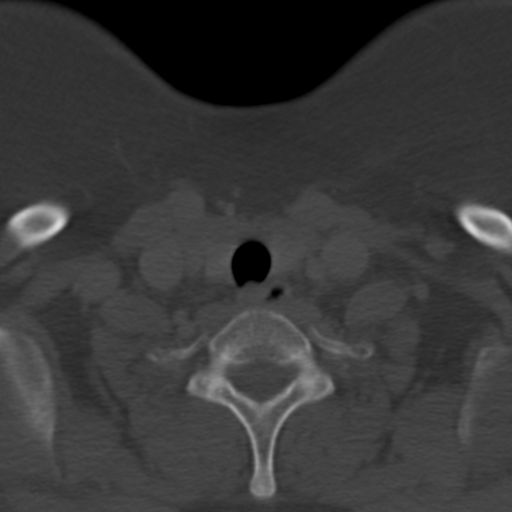
[im 27/66  bone]
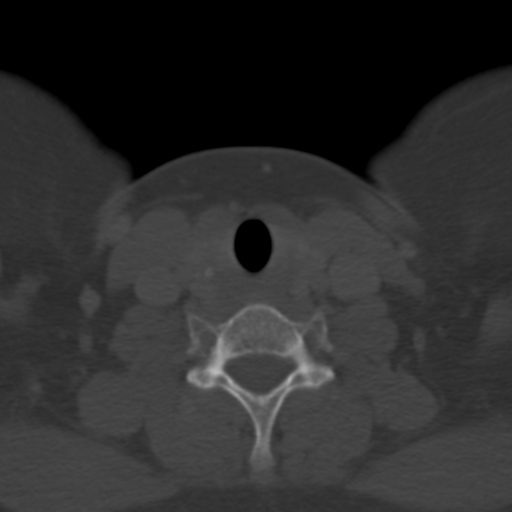
[im 40/66  bone]
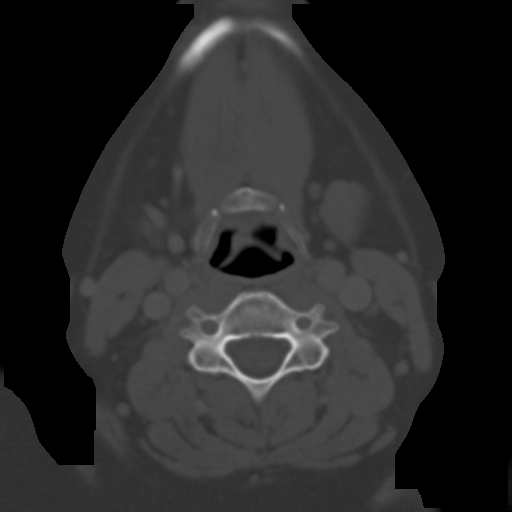
[im 46/66  soft-tissue]
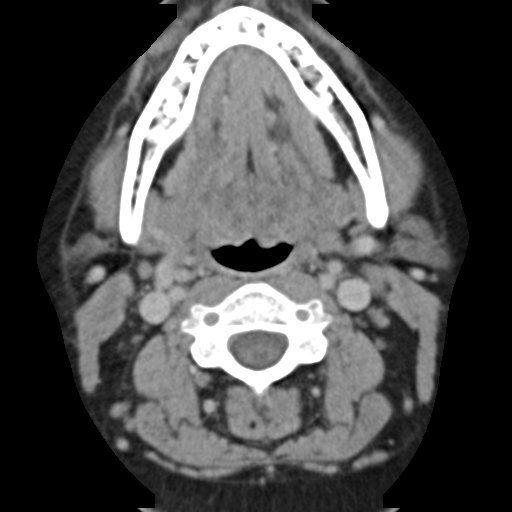
[im 46/66  bone]
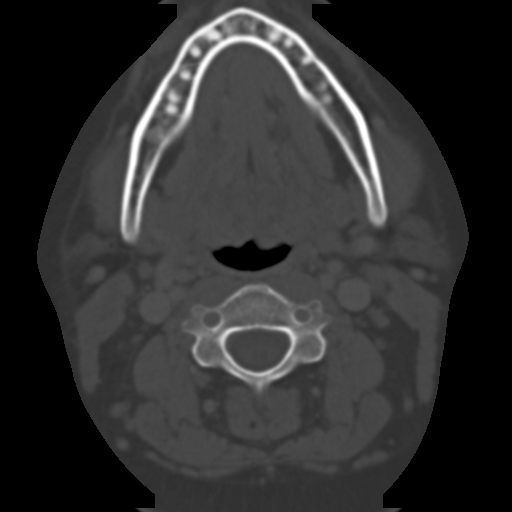
[im 59/66  bone]
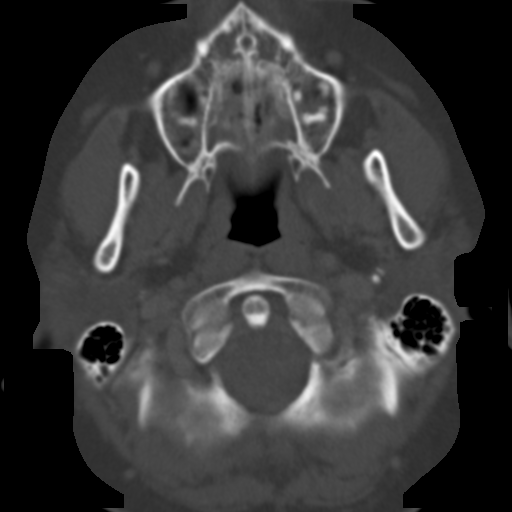

[Series 4: neck 3.0 soft tissue coronal · coronal · 0.31mm/px · 3 of 50 slices shown]
[im 10/50  bone]
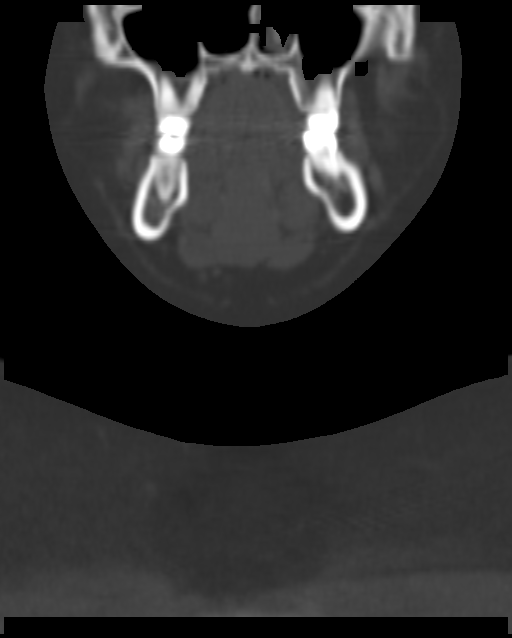
[im 20/50  bone]
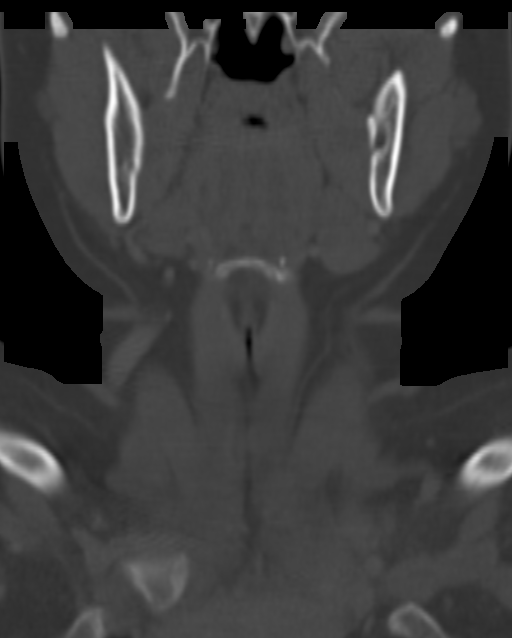
[im 30/50  bone]
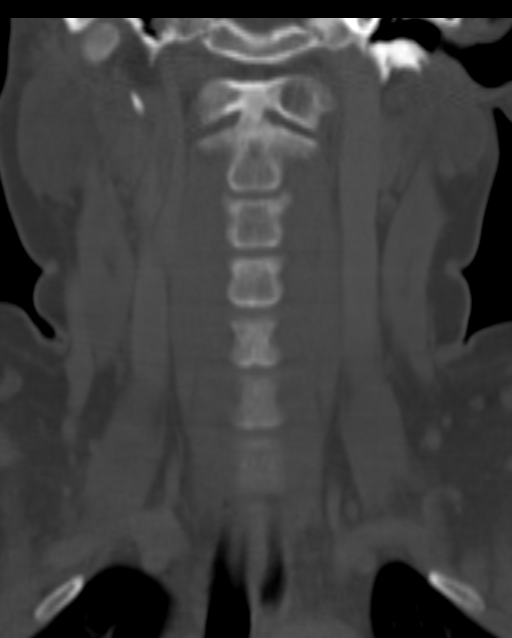

[Series 5: neck 3.0 soft tissue sag · sagittal · 0.29mm/px · 5 of 50 slices shown, 6 images]
[im 17/50  bone]
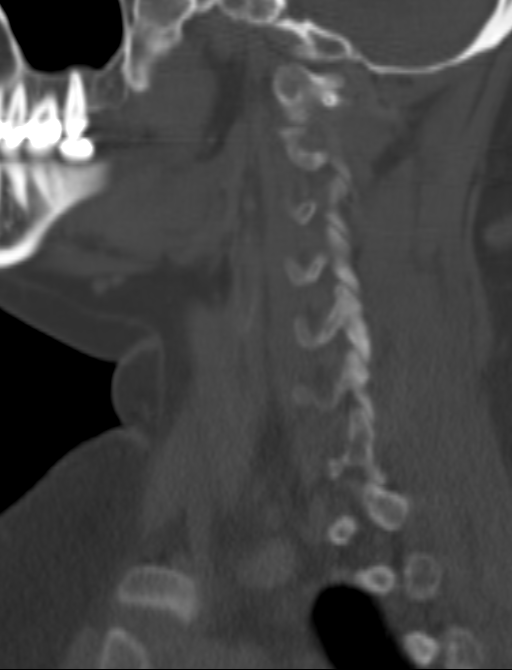
[im 21/50  bone]
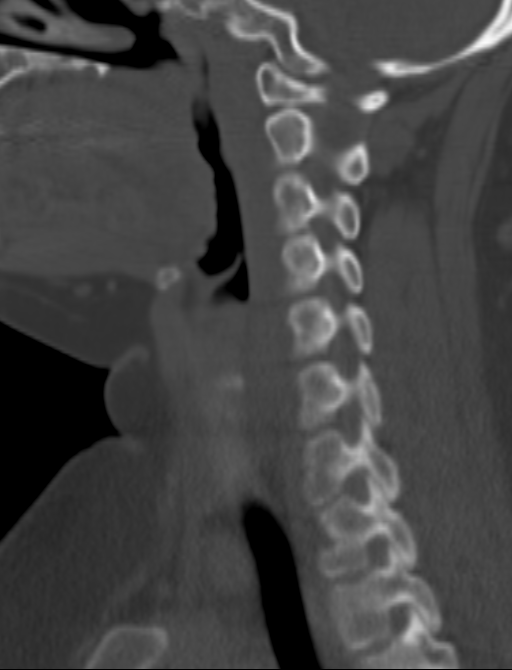
[im 25/50  soft-tissue]
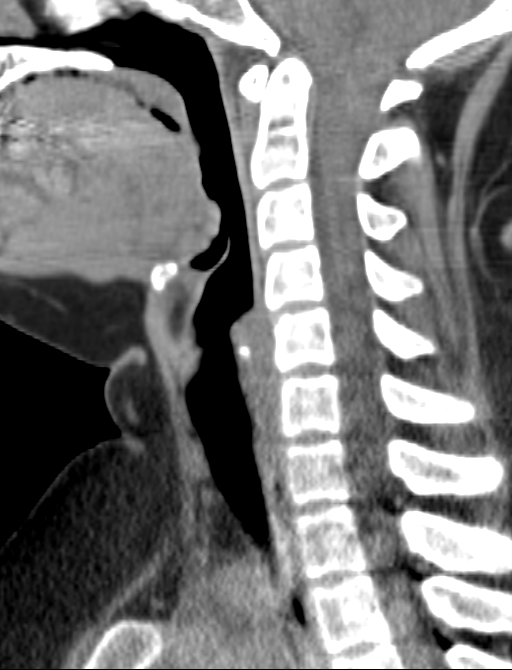
[im 25/50  bone]
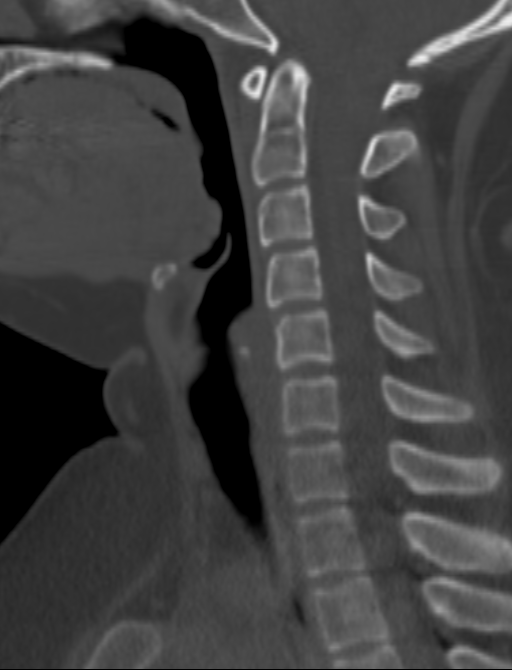
[im 29/50  bone]
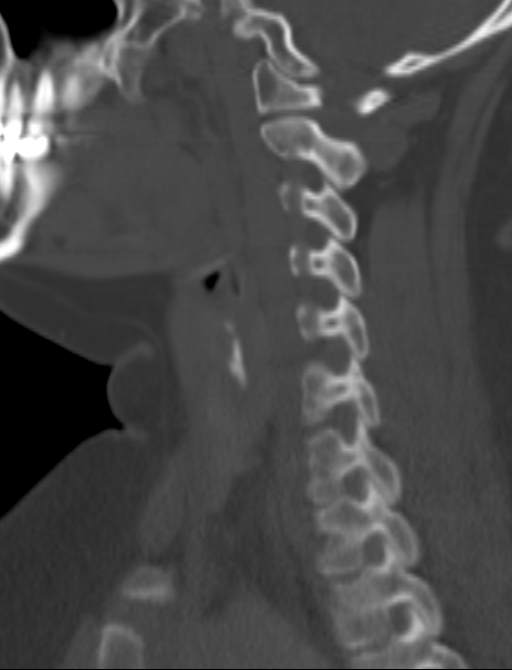
[im 33/50  bone]
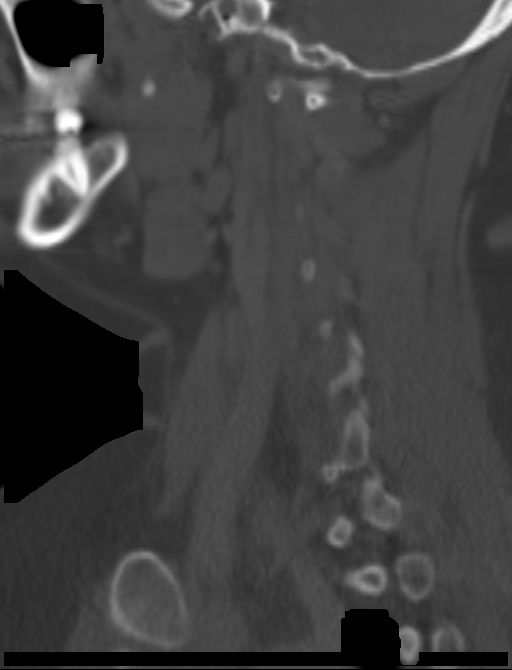

[14 of 33 positions shown; findings below may reference images not displayed]

FINDINGS: Negative visualized lung apices.  Negative visualized
superior mediastinum.  Negative thyroid, larynx, hypopharynx,
oropharynx, nasopharynx, parapharyngeal spaces, retropharyngeal
space, sublingual space, submandibular glands and parotid glands.
Incidental oral tongue appears partially visible.  No cervical
lymphadenopathy.  No focal inflammatory changes.

Suboptimal intravascular contrast bolus timing, but major vascular
structures appear grossly patent.  Negative visualized brain
parenchyma. Visualized paranasal sinuses and mastoids are clear.
No acute osseous abnormality identified.  }
IMPRESSION: Negative CT appearance of the neck.

## 2012-10-04 ENCOUNTER — Other Ambulatory Visit: Payer: Self-pay | Admitting: Obstetrics & Gynecology

## 2012-10-06 ENCOUNTER — Telehealth: Payer: Self-pay | Admitting: *Deleted

## 2012-10-06 ENCOUNTER — Other Ambulatory Visit: Payer: Self-pay | Admitting: Obstetrics & Gynecology

## 2012-10-06 MED ORDER — CIPROFLOXACIN HCL 500 MG PO TABS: 500.0000 mg | ORAL_TABLET | Freq: Two times a day (BID) | ORAL | Status: DC

## 2012-10-06 NOTE — Progress Notes (Signed)
Patient needs to be informed of her positive results.  I have prescribed electronically Ciprofloxacin 500 BID x 7 days at her pharmacy. Kestrel Mis H 10/06/2012 12:24 PM

## 2012-10-06 NOTE — Addendum Note (Signed)
Addended by: Lazaro Arms on: 10/06/2012 12:30 PM   Modules accepted: Orders

## 2012-10-06 NOTE — Telephone Encounter (Signed)
Contacted pt informing of rx called to pharmacy and positive results of ua. Pt stated already picked up RX from pharmacy.

## 2012-10-11 ENCOUNTER — Encounter: Payer: Self-pay | Admitting: Obstetrics & Gynecology

## 2013-01-10 ENCOUNTER — Encounter: Payer: Self-pay | Admitting: *Deleted

## 2013-09-11 ENCOUNTER — Emergency Department (HOSPITAL_COMMUNITY)
Admission: EM | Admit: 2013-09-11 | Discharge: 2013-09-11 | Disposition: A | Discharge status: Home / Self Care | Payer: Medicaid Other

## 2013-09-11 NOTE — ED Notes (Signed)
Called for pt x 1.  

## 2013-09-11 NOTE — ED Notes (Signed)
Called for pt x 3, pt not in waiting room.

## 2013-09-11 NOTE — ED Notes (Signed)
Called for pt x 2 

## 2013-10-06 ENCOUNTER — Other Ambulatory Visit: Payer: Self-pay | Admitting: Obstetrics & Gynecology

## 2014-03-23 ENCOUNTER — Ambulatory Visit: Payer: Self-pay | Admitting: Advanced Practice Midwife

## 2014-03-29 ENCOUNTER — Ambulatory Visit: Payer: Self-pay | Admitting: Advanced Practice Midwife

## 2014-04-06 ENCOUNTER — Emergency Department (HOSPITAL_COMMUNITY)
Admission: EM | Admit: 2014-04-06 | Discharge: 2014-04-06 | Disposition: A | Discharge status: Home / Self Care | Payer: Medicaid Other | Attending: Emergency Medicine | Admitting: Emergency Medicine

## 2014-04-06 ENCOUNTER — Encounter (HOSPITAL_COMMUNITY): Payer: Self-pay | Admitting: Emergency Medicine

## 2014-04-06 DIAGNOSIS — N390 Urinary tract infection, site not specified: Secondary | ICD-10-CM | POA: Insufficient documentation

## 2014-04-06 DIAGNOSIS — M25569 Pain in unspecified knee: Secondary | ICD-10-CM | POA: Diagnosis not present

## 2014-04-06 DIAGNOSIS — Z8619 Personal history of other infectious and parasitic diseases: Secondary | ICD-10-CM | POA: Diagnosis not present

## 2014-04-06 DIAGNOSIS — Z3202 Encounter for pregnancy test, result negative: Secondary | ICD-10-CM | POA: Diagnosis not present

## 2014-04-06 DIAGNOSIS — M79605 Pain in left leg: Secondary | ICD-10-CM

## 2014-04-06 DIAGNOSIS — Z79899 Other long term (current) drug therapy: Secondary | ICD-10-CM | POA: Diagnosis not present

## 2014-04-06 DIAGNOSIS — G8929 Other chronic pain: Secondary | ICD-10-CM | POA: Insufficient documentation

## 2014-04-06 DIAGNOSIS — K219 Gastro-esophageal reflux disease without esophagitis: Secondary | ICD-10-CM | POA: Diagnosis not present

## 2014-04-06 DIAGNOSIS — Z792 Long term (current) use of antibiotics: Secondary | ICD-10-CM | POA: Diagnosis not present

## 2014-04-06 DIAGNOSIS — Z791 Long term (current) use of non-steroidal anti-inflammatories (NSAID): Secondary | ICD-10-CM | POA: Insufficient documentation

## 2014-04-06 DIAGNOSIS — R300 Dysuria: Secondary | ICD-10-CM | POA: Insufficient documentation

## 2014-04-06 DIAGNOSIS — Z8679 Personal history of other diseases of the circulatory system: Secondary | ICD-10-CM | POA: Insufficient documentation

## 2014-04-06 LAB — URINALYSIS, ROUTINE W REFLEX MICROSCOPIC
BILIRUBIN URINE: NEGATIVE
Glucose, UA: NEGATIVE mg/dL
Ketones, ur: NEGATIVE mg/dL
Nitrite: NEGATIVE
PROTEIN: NEGATIVE mg/dL
Specific Gravity, Urine: 1.025 (ref 1.005–1.030)
UROBILINOGEN UA: 0.2 mg/dL (ref 0.0–1.0)
pH: 5.5 (ref 5.0–8.0)

## 2014-04-06 LAB — URINE MICROSCOPIC-ADD ON

## 2014-04-06 LAB — PREGNANCY, URINE: Preg Test, Ur: NEGATIVE

## 2014-04-06 MED ORDER — NITROFURANTOIN MONOHYD MACRO 100 MG PO CAPS: 100.0000 mg | ORAL_CAPSULE | Freq: Two times a day (BID) | ORAL | Status: DC

## 2014-04-06 NOTE — ED Notes (Signed)
Pt co burning during urination, also has lt knee pain and co chest pain with exertion. Denies chest pain currently.

## 2014-04-06 NOTE — Discharge Instructions (Signed)
If you were given medicines take as directed.  If you are on coumadin or contraceptives realize their levels and effectiveness is altered by many different medicines.  If you have any reaction (rash, tongues swelling, other) to the medicines stop taking and see a physician.   Please follow up as directed and return to the ER or see a physician for new or worsening symptoms.  Thank you. Filed Vitals:   04/06/14 0844  BP: 111/65  Pulse: 73  Temp: 97.9 F (36.6 C)  TempSrc: Oral  Resp: 18  Height:  (1.676 m)  Weight: 265 lb (120.203 kg)  SpO2: 97%

## 2014-04-06 NOTE — ED Provider Notes (Signed)
CSN: [id number]     Arrival date & time 04/06/14  4098 History  This chart was scribed for Enid Skeens, MD by Littie Deeds, ED Scribe. This patient was seen in room APA07/APA07 and the patient's care was started at 9:18 AM.     Chief Complaint  Patient presents with  . Urinary Tract Infection     Patient is a 23 y.o. female presenting with urinary tract infection. The history is provided by the patient. No language interpreter was used.  Urinary Tract Infection The current episode started more than 2 days ago. The problem has not changed since onset.Nothing aggravates the symptoms. Nothing relieves the symptoms. She has tried nothing for the symptoms.   HPI Comments: Sierra Heath is a 23 y.o. female who presents to the Emergency Department complaining of a UTI with a burning pain that started 4 days ago. She notes associated hematuria and dysuria. She also notes dull left knee pain radiating to her toes. Patient denies fever, chills, back pain, vomiting, injury, recent travel, recent surgery, and taking BCP. She denies Hx of HLD, DM, CAD and FMHx of CAD.   Past Medical History  Diagnosis Date  . Migraine   . Back pain, chronic   . Acid reflux   . Sleep apnea   . History of chlamydia    Past Surgical History  Procedure Laterality Date  . Tonsillectomy    . Hernia repair    . Cesarean section    . Wisdom tooth extraction     Family History  Problem Relation Age of Onset  . Cancer Father   . Diabetes Father   . Hypertension Other   . Diabetes Other   . Cancer Other     lung   History  Substance Use Topics  . Smoking status: Never Smoker   . Smokeless tobacco: Not on file  . Alcohol Use: No   OB History   Grav Para Term Preterm Abortions TAB SAB Ect Mult Living   Review of Systems  Constitutional: Negative for fever and chills.  Gastrointestinal: Negative for vomiting.  Genitourinary: Positive for dysuria and hematuria.  Musculoskeletal:  Positive for arthralgias (left knee). Negative for back pain.  All other systems reviewed and are negative.     Allergies  Review of patient's allergies indicates no known allergies.  Home Medications   Prior to Admission medications   Medication Sig Start Date End Date Taking? Authorizing Provider  ciprofloxacin (CIPRO) 500 MG tablet Take 1 tablet (500 mg total) by mouth 2 (two) times daily. 10/06/12   Lazaro Arms, MD  Esomeprazole Magnesium (NEXIUM PO) Take by mouth.    Historical Provider, MD  methocarbamol (ROBAXIN) 500 MG tablet Take 2 tablets (1,000 mg total) by mouth 4 (four) times daily as needed (muscle spasm/pain). 08/08/12   Samuel Jester, DO  naproxen (NAPROSYN) 250 MG tablet Take 1 tablet (250 mg total) by mouth 2 (two) times daily with a meal. 08/08/12   Samuel Jester, DO  omeprazole (PRILOSEC) 20 MG capsule Take 20 mg by mouth 2 (two) times daily.     Historical Provider, MD   BP 111/65  Pulse 73  Temp(Src) 97.9 F (36.6 C) (Oral)  Resp 18  Ht  (1.676 m)  Wt 265 lb (120.203 kg)  BMI 42.79 kg/m2  SpO2 97%  LMP 04/06/2014 Physical Exam  Nursing note and vitals reviewed. Constitutional: She  is oriented to person, place, and time. She appears well-developed and well-nourished. No distress.  HENT:  Head: Normocephalic and atraumatic.  Mouth/Throat: Oropharynx is clear and moist. No oropharyngeal exudate.  Eyes: Pupils are equal, round, and reactive to light.  Neck: Neck supple.  Cardiovascular: Normal rate.   Pulses:      Dorsalis pedis pulses are 2+ on the right side, and 2+ on the left side.  Pulmonary/Chest: Effort normal.  Abdominal: Soft. There is no tenderness. There is no rebound.  Musculoskeletal: She exhibits no edema.  No calf pain and edema, no flank tenderness, ligaments are strong and intact in left knee, no tenderness left knee no swelling  Neurological: She is alert and oriented to person, place, and time. No cranial nerve deficit.   Skin: Skin is warm and dry. No rash noted.  Psychiatric: She has a normal mood and affect. Her behavior is normal.    ED Course  Procedures  DIAGNOSTIC STUDIES: Oxygen Saturation is 97% on RA, adequate by my interpretation.    COORDINATION OF CARE: 9:12 AM-Discussed treatment plan which includes EKG and analysis of labs with pt at bedside and pt agreed to plan.     Labs Review Labs Reviewed  URINALYSIS, ROUTINE W REFLEX MICROSCOPIC - Abnormal; Notable for the following:    Hgb urine dipstick LARGE (*)    Leukocytes, UA MODERATE (*)    All other components within normal limits  URINE MICROSCOPIC-ADD ON - Abnormal; Notable for the following:    Squamous Epithelial / LPF MANY (*)    Bacteria, UA MANY (*)    All other components within normal limits  PREGNANCY, URINE    Imaging Review No results found.   EKG Interpretation   Date/Time:  Thursday April 06 2014 09:55:00 EDT Ventricular Rate:  81 PR Interval:  105 QRS Duration: 88 QT Interval:  371 QTC Calculation: 431 R Axis:   48 Text Interpretation:  Sinus rhythm Short PR interval ED PHYSICIAN  INTERPRETATION AVAILABLE IN [facility] Confirmed by TEST, Record  ([id number]) on 04/08/2014 8:51:16 AM     EKG reviewed HR 81, sinus, no acute ST changes, QT normal MDM   Final diagnoses:  UTI (lower urinary tract infection)  Leg pain, anterior, left    I personally performed the services described in this documentation, which was scribed in my presence. The recorded information has been reviewed and is accurate.  Patient's primary reason for the visit is dysuria, and review of systems patient has other issues she's had for the past couple weeks.  Well-appearing patient with clinically urine infection, urinalysis confirms. Plan for oral antibiotics and outpatient followup, no systemic symptoms or flank pain.  Patient with left leg pain, no pain currently, no blood clot risk factors, no signs of infection or  swelling. Clinically musculoskeletal and discussed supportive care.  Patient has PERC negative.  Patient had very brief transient epigastric/lower chest discomfort one week prior patient has not had any since. Patient denies cardiac and blood clot risk factors. Discussed importance of followup outpatient and to return if patient develops chest pain or shortness of breath.  Results and differential diagnosis were discussed with the patient/parent/guardian. Close follow up outpatient was discussed, comfortable with the plan.   Medications - No data to display  Filed Vitals:   04/06/14 0844  BP: 111/65  Pulse: 73  Temp: 97.9 F (36.6 C)  TempSrc: Oral  Resp: 18  Height:  (1.676 m)  Weight: 265 lb (120.203 kg)  SpO2: 97%    Final diagnoses:  UTI (lower urinary tract infection)  Leg pain, anterior, left        Enid Skeens, MD 04/08/14 1113

## 2014-05-16 ENCOUNTER — Ambulatory Visit (INDEPENDENT_AMBULATORY_CARE_PROVIDER_SITE_OTHER): Payer: Medicaid Other | Admitting: Obstetrics & Gynecology

## 2014-05-16 ENCOUNTER — Encounter: Payer: Self-pay | Admitting: Obstetrics & Gynecology

## 2014-05-16 VITALS — BP 90/60 | Wt 276.0 lb

## 2014-05-16 DIAGNOSIS — A5901 Trichomonal vulvovaginitis: Secondary | ICD-10-CM

## 2014-05-16 MED ORDER — METRONIDAZOLE 500 MG PO TABS: 500.0000 mg | ORAL_TABLET | Freq: Two times a day (BID) | ORAL | Status: DC

## 2014-05-16 NOTE — Progress Notes (Signed)
Patient ID: Sierra Heath, female   DOB: Mar 08, 1991, 23 y.o.   MRN: [id number] Chief Complaint  Patient presents with  . gyn visit    vaginal discharge/ odor.    HPI Presents complaining of a vaginal discharge for over 1 year Never had before as far as she knows  ROS No burning with urination, frequency or urgency No nausea, vomiting or diarrhea Nor fever chills or other constitutional symptoms   Blood pressure 90/60, weight 276 lb (125.193 kg), last menstrual period 05/03/2014.  EXAM Abdomen:      soft Vulva:            normal appearing vulva with no masses, tenderness or lesions Vagina:          normal mucosa, thin grey discharge Cervix:           normal appearance Uterus:           Adnexa:          Rectal:            Hemoccult:     performed                         Wet prep:  +trichomonas, no yeast or BV  Assessment/Plan:  Trichomonas vaginalis:  Metronidazole 500 BID x 7 days TOC wet prep in 3 weeks

## 2014-05-22 ENCOUNTER — Encounter: Payer: Self-pay | Admitting: Obstetrics & Gynecology

## 2014-06-06 ENCOUNTER — Encounter: Payer: Self-pay | Admitting: Obstetrics & Gynecology

## 2014-06-06 ENCOUNTER — Ambulatory Visit (INDEPENDENT_AMBULATORY_CARE_PROVIDER_SITE_OTHER): Payer: Medicaid Other | Admitting: Obstetrics & Gynecology

## 2014-06-06 VITALS — BP 100/60 | Wt 286.0 lb

## 2014-06-06 DIAGNOSIS — A5901 Trichomonal vulvovaginitis: Secondary | ICD-10-CM

## 2014-06-06 NOTE — Progress Notes (Signed)
Patient ID: Sierra Heath, female   DOB: February 12, 1991, 23 y.o.   MRN: [id number] Pt had +trichomonas on wet prep from 3 weeks ago Treated with metronidazole 500 BID x 7 days  Here for TOC partner not an issue  Wet prep today: No trich  No yeast  No BV  Clear vaginal wet prep s/p trichomonas

## 2014-07-26 ENCOUNTER — Emergency Department (HOSPITAL_COMMUNITY)
Admission: EM | Admit: 2014-07-26 | Discharge: 2014-07-26 | Disposition: A | Discharge status: Home / Self Care | Payer: Medicaid Other | Attending: Neurosurgery | Admitting: Neurosurgery

## 2014-07-26 ENCOUNTER — Encounter (HOSPITAL_COMMUNITY): Payer: Self-pay

## 2014-07-26 DIAGNOSIS — Z8619 Personal history of other infectious and parasitic diseases: Secondary | ICD-10-CM | POA: Diagnosis not present

## 2014-07-26 DIAGNOSIS — G8929 Other chronic pain: Secondary | ICD-10-CM | POA: Insufficient documentation

## 2014-07-26 DIAGNOSIS — Y92321 Football field as the place of occurrence of the external cause: Secondary | ICD-10-CM | POA: Insufficient documentation

## 2014-07-26 DIAGNOSIS — Y9361 Activity, american tackle football: Secondary | ICD-10-CM | POA: Diagnosis not present

## 2014-07-26 DIAGNOSIS — S0993XA Unspecified injury of face, initial encounter: Secondary | ICD-10-CM | POA: Diagnosis present

## 2014-07-26 DIAGNOSIS — Z8679 Personal history of other diseases of the circulatory system: Secondary | ICD-10-CM | POA: Diagnosis not present

## 2014-07-26 DIAGNOSIS — Y998 Other external cause status: Secondary | ICD-10-CM | POA: Insufficient documentation

## 2014-07-26 DIAGNOSIS — W2101XA Struck by football, initial encounter: Secondary | ICD-10-CM | POA: Insufficient documentation

## 2014-07-26 DIAGNOSIS — Z8719 Personal history of other diseases of the digestive system: Secondary | ICD-10-CM | POA: Diagnosis not present

## 2014-07-26 NOTE — ED Notes (Signed)
NAD at this time. Pt is leaving with her mother 

## 2014-07-26 NOTE — Consult Note (Signed)
Reason for Consult: Questionable CSF leak Referring Physician: Emergency department  Sierra Heath is an 24 y.o. female.  HPI: Patient is a 24 year old female who sustained a trauma with a football to the face approximately 2 days ago went to the ER today with some congestion and a runny nose was [facility] was felt to have a spinal fluid leak head CT obtained based on her recurrent denies showed a nasal bone fracture but no evidence of any basilar skull or intrinsic facial fractures. Patient currently denies any significant drainage she reports [facility] when she sat up and flexed her neck forward and did not have any drainage until he asked her to blow her nose. She currently denies any headache as well. Denies any nausea vomiting numbness and tingling or any other constitutional signs of head injury concussion or CSF leak.  Past Medical History  Diagnosis Date  . Migraine   . Back pain, chronic   . Acid reflux   . Sleep apnea   . History of chlamydia     Past Surgical History  Procedure Laterality Date  . Tonsillectomy    . Hernia repair    . Cesarean section    . Wisdom tooth extraction      Family History  Problem Relation Age of Onset  . Cancer Father   . Diabetes Father   . Hypertension Other   . Diabetes Other   . Cancer Other     lung    Social History:  reports that she has never smoked. She does not have any smokeless tobacco history on file. She reports that she does not drink alcohol or use illicit drugs.  Allergies:  Allergies  Allergen Reactions  . Hydrocodone Hives    Medications: I have reviewed the patient's current medications.  No results found for this or any previous visit (from the past 48 hour(s)).  No results found.  Review of Systems  Constitutional: Negative.   HENT: Positive for congestion.   Eyes: Negative.   Cardiovascular: Negative.   Gastrointestinal: Negative.   Genitourinary: Negative.    Musculoskeletal: Negative.   Skin: Negative.   Neurological: Negative.   Psychiatric/Behavioral: Negative.    There were no vitals taken for this visit. Physical Exam  Constitutional: She is oriented to person, place, and time. She appears well-developed and well-nourished.  HENT:  Head: Normocephalic.  Eyes: Pupils are equal, round, and reactive to light.  Neck: Normal range of motion.  Respiratory: Effort normal.  GI: Soft.  Neurological: She is alert and oriented to person, place, and time. She has normal strength. GCS eye subscore is 4. GCS verbal subscore is 5. GCS motor subscore is 6.  Pupils equal struck movements are intact strength is 55 in her upper and lower 70s she displays no evidence of CSF rhinorrhea    Assessment/Plan: 24 year old female with chronic sinusitis and sinus drainage states unchanged over the last 2 days. She displays no evidence on clinical exam in the ER CSF leak or CT scan a max facial CT shows no fluid in the sinuses no bony defect that would be attributable to the source of the CSF leak. I instructed the patient what to watch for and she'll be okay to be discharged home with scheduled follow-up in 2 weeks.  Sierra Heath P 07/26/2014, 6:55 PM

## 2014-07-26 NOTE — Discharge Instructions (Signed)

## 2014-07-26 NOTE — ED Notes (Signed)
Paged Dr. Lindie SpruceWyatt and Dr. Wynetta Emeryram to Charge, [phone]

## 2014-07-26 NOTE — ED Notes (Signed)
Per [facility], patient from [facility]. Was hit in the nose x2 days ago by a football. Has since had what she thought was a "runny nose". Per [facility], this is cerebral spinal fluid, positive halo sign.

## 2014-08-10 ENCOUNTER — Ambulatory Visit: Payer: Medicaid Other | Admitting: Obstetrics and Gynecology

## 2014-08-17 ENCOUNTER — Ambulatory Visit: Payer: Medicaid Other | Admitting: Obstetrics & Gynecology

## 2014-09-02 ENCOUNTER — Emergency Department (HOSPITAL_COMMUNITY)
Admission: EM | Admit: 2014-09-02 | Discharge: 2014-09-02 | Disposition: A | Discharge status: Home / Self Care | Payer: Medicaid Other | Attending: Emergency Medicine | Admitting: Emergency Medicine

## 2014-09-02 ENCOUNTER — Encounter (HOSPITAL_COMMUNITY): Payer: Self-pay

## 2014-09-02 DIAGNOSIS — Z79899 Other long term (current) drug therapy: Secondary | ICD-10-CM | POA: Insufficient documentation

## 2014-09-02 DIAGNOSIS — Z8619 Personal history of other infectious and parasitic diseases: Secondary | ICD-10-CM | POA: Insufficient documentation

## 2014-09-02 DIAGNOSIS — R51 Headache: Secondary | ICD-10-CM | POA: Diagnosis present

## 2014-09-02 DIAGNOSIS — K219 Gastro-esophageal reflux disease without esophagitis: Secondary | ICD-10-CM | POA: Insufficient documentation

## 2014-09-02 DIAGNOSIS — G43709 Chronic migraine without aura, not intractable, without status migrainosus: Secondary | ICD-10-CM | POA: Diagnosis not present

## 2014-09-02 DIAGNOSIS — G8929 Other chronic pain: Secondary | ICD-10-CM | POA: Diagnosis not present

## 2014-09-02 DIAGNOSIS — G43009 Migraine without aura, not intractable, without status migrainosus: Secondary | ICD-10-CM

## 2014-09-02 MED ORDER — KETOROLAC TROMETHAMINE 60 MG/2ML IM SOLN
60.0000 mg | Freq: Once | INTRAMUSCULAR | Status: AC
  Administered 2014-09-02: 60 mg via INTRAMUSCULAR
  Filled 2014-09-02: qty 2

## 2014-09-02 MED ORDER — ONDANSETRON 4 MG PO TBDP
4.0000 mg | ORAL_TABLET | Freq: Once | ORAL | Status: AC
Start: 2014-09-02 — End: 2014-09-02
  Administered 2014-09-02: 4 mg via ORAL
  Filled 2014-09-02: qty 1

## 2014-09-02 MED ORDER — DIPHENHYDRAMINE HCL 50 MG/ML IJ SOLN
50.0000 mg | Freq: Once | INTRAMUSCULAR | Status: AC
  Administered 2014-09-02: 50 mg via INTRAMUSCULAR
  Filled 2014-09-02: qty 1

## 2014-09-02 MED ORDER — METOCLOPRAMIDE HCL 5 MG/ML IJ SOLN
10.0000 mg | Freq: Once | INTRAMUSCULAR | Status: AC
  Administered 2014-09-02: 10 mg via INTRAMUSCULAR
  Filled 2014-09-02: qty 2

## 2014-09-02 NOTE — Discharge Instructions (Signed)
Go home and rest. Drink plenty of fluids. Recheck as needed.     Migraine Headache A migraine headache is an intense, throbbing pain on one or both sides of your head. A migraine can last for 30 minutes to several hours. CAUSES  The exact cause of a migraine headache is not always known. However, a migraine may be caused when nerves in the brain become irritated and release chemicals that cause inflammation. This causes pain. Certain things may also trigger migraines, such as:  Alcohol.  Smoking.  Stress.  Menstruation.  Aged cheeses.  Foods or drinks that contain nitrates, glutamate, aspartame, or tyramine.  Lack of sleep.  Chocolate.  Caffeine.  Hunger.  Physical exertion.  Fatigue.  Medicines used to treat chest pain (nitroglycerine), birth control pills, estrogen, and some blood pressure medicines. SIGNS AND SYMPTOMS  Pain on one or both sides of your head.  Pulsating or throbbing pain.  Severe pain that prevents daily activities.  Pain that is aggravated by any physical activity.  Nausea, vomiting, or both.  Dizziness.  Pain with exposure to bright lights, loud noises, or activity.  General sensitivity to bright lights, loud noises, or smells. Before you get a migraine, you may get warning signs that a migraine is coming (aura). An aura may include:  Seeing flashing lights.  Seeing bright spots, halos, or zigzag lines.  Having tunnel vision or blurred vision.  Having feelings of numbness or tingling.  Having trouble talking.  Having muscle weakness. DIAGNOSIS  A migraine headache is often diagnosed based on:  Symptoms.  Physical exam.  A CT scan or MRI of your head. These imaging tests cannot diagnose migraines, but they can help rule out other causes of headaches. TREATMENT Medicines may be given for pain and nausea. Medicines can also be given to help prevent recurrent migraines.  HOME CARE INSTRUCTIONS  Only take over-the-counter or  prescription medicines for pain or discomfort as directed by your health care provider. The use of long-term narcotics is not recommended.  Lie down in a dark, quiet room when you have a migraine.  Keep a journal to find out what may trigger your migraine headaches. For example, write down:  What you eat and drink.  How much sleep you get.  Any change to your diet or medicines.  Limit alcohol consumption.  Quit smoking if you smoke.  Get 7-9 hours of sleep, or as recommended by your health care provider.  Limit stress.  Keep lights dim if bright lights bother you and make your migraines worse. SEEK IMMEDIATE MEDICAL CARE IF:   Your migraine becomes severe.  You have a fever.  You have a stiff neck.  You have vision loss.  You have muscular weakness or loss of muscle control.  You start losing your balance or have trouble walking.  You feel faint or pass out.  You have severe symptoms that are different from your first symptoms. MAKE SURE YOU:   Understand these instructions.  Will watch your condition.  Will get help right away if you are not doing well or get worse. Document Released: 07/07/2005 Document Revised: 11/21/2013 Document Reviewed: 03/14/2013 [facility] Patient Information 2015 [place], [place]. This information is not intended to replace advice given to you by your health care provider. Make sure you discuss any questions you have with your health care provider.

## 2014-09-02 NOTE — ED Provider Notes (Signed)
CSN: [id number]     Arrival date & time 09/02/14  0602 History   First MD Initiated Contact with Patient 09/02/14 [phone]     Chief Complaint  Patient presents with  . Migraine     (Consider location/radiation/quality/duration/timing/severity/associated sxs/prior Treatment) HPI  Patient reports she has a history of migraine headaches. She states she normally gets them every month a few days before her period. She states however her menses started about 4 days ago. She states yesterday evening about 6 PM she started getting her typical headache. She states she normally takes Excedrin and it goes away however she was at work and was not able to take her Excedrin in a timely manner. She states her pain is located near her right temple. It does not throb. She has nausea without vomiting. She has light and noise sensitivity. She denies any numbness certainly with her extremities. She states this headache is exactly like headache she's had before. She states the last time she had her headache she went to [facility] emergency department.  PCP Dr Loleta [facility]  Past Medical History  Diagnosis Date  . Migraine   . Back pain, chronic   . Acid reflux   . Sleep apnea   . History of chlamydia    Past Surgical History  Procedure Laterality Date  . Tonsillectomy    . Hernia repair    . Cesarean section    . Wisdom tooth extraction     Family History  Problem Relation Age of Onset  . Cancer Father   . Diabetes Father   . Hypertension Other   . Diabetes Other   . Cancer Other     lung   History  Substance Use Topics  . Smoking status: Never Smoker   . Smokeless tobacco: Not on file  . Alcohol Use: No   Employed at [employer]  OB History    Gravida Para Term Preterm AB TAB SAB Ectopic Multiple Living   Review of Systems  All other systems reviewed and are negative.     Allergies  Hydrocodone  Home Medications   Prior to Admission medications     Medication Sig Start Date End Date Taking? Authorizing Provider  omeprazole (PRILOSEC) 40 MG capsule Take 40 mg by mouth daily.   Yes Historical Provider, MD  oxyCODONE-acetaminophen (PERCOCET) 10-325 MG per tablet Take 1 tablet by mouth every 6 (six) hours.   Yes Historical Provider, MD   BP 109/49 mmHg  Pulse 74  Temp(Src) 97.8 F (36.6 C) (Oral)  Resp 18  Ht  (1.676 m)  Wt 270 lb (122.471 kg)  BMI 43.60 kg/m2  SpO2 99%  LMP 08/25/2014  Vital signs normal   Physical Exam  Constitutional: She is oriented to person, place, and time. She appears well-developed and well-nourished.  Non-toxic appearance. She does not appear ill. No distress.  HENT:  Head: Normocephalic and atraumatic.    Right Ear: External ear normal.  Left Ear: External ear normal.  Nose: Nose normal. No mucosal edema or rhinorrhea.  Mouth/Throat: Oropharynx is clear and moist and mucous membranes are normal. No dental abscesses or uvula swelling.  Area of pain noted  Eyes: Conjunctivae and EOM are normal. Pupils are equal, round, and reactive to light.  Neck: Normal range of motion and full passive range of motion without pain. Neck supple.  Cardiovascular: Normal rate, regular rhythm and normal  heart sounds.  Exam reveals no gallop and no friction rub.   No murmur heard. Pulmonary/Chest: Effort normal and breath sounds normal. No respiratory distress. She has no wheezes. She has no rhonchi. She has no rales. She exhibits no tenderness and no crepitus.  Abdominal: Soft. Normal appearance and bowel sounds are normal. She exhibits no distension. There is no tenderness. There is no rebound and no guarding.  Musculoskeletal: Normal range of motion. She exhibits no edema or tenderness.  Moves all extremities well.   Neurological: She is alert and oriented to person, place, and time. She has normal strength. No cranial nerve deficit.  Skin: Skin is warm, dry and intact. No rash noted. No erythema. No pallor.   Psychiatric: She has a normal mood and affect. Her speech is normal and behavior is normal. Her mood appears not anxious.  Nursing note and vitals reviewed.   ED Course  Procedures (including critical care time)  Medications  metoCLOPramide (REGLAN) injection 10 mg (10 mg Intramuscular Given 09/02/14 0648)  ketorolac (TORADOL) injection 60 mg (60 mg Intramuscular Given 09/02/14 0648)  diphenhydrAMINE (BENADRYL) injection 50 mg (50 mg Intramuscular Given 09/02/14 0648)  ondansetron (ZOFRAN-ODT) disintegrating tablet 4 mg (4 mg Oral Given 09/02/14 [id number])    Patient does not want an IV for her medications. She states she just wants some shots and she wants to go home. States her grandmother is going to come pick her up.  [place] shows patient gets #120 oxycodone 10/325 monthly from the [facility]. It was last filled on January 29. When patient was asked what medication she takes she states she only takes omeprazole. When she was queried about the oxycodone she started laughing and said "Oh, yes".   Labs Review Labs Reviewed - No data to display  Imaging Review No results found.   EKG Interpretation None      MDM   Final diagnoses:  Migraine without aura and without status migrainosus, not intractable    Plan discharge   Devoria AlbeIva Ciria Bernardini, MD, [facility] DellFACEP     Mycheal Veldhuizen L Yarianna Varble, MD 09/02/14 0700

## 2014-09-02 NOTE — ED Notes (Signed)
Pt reports migraine headache since 6 pm yesterday

## 2014-09-11 ENCOUNTER — Encounter: Payer: Self-pay | Admitting: Adult Health

## 2014-09-11 ENCOUNTER — Ambulatory Visit (INDEPENDENT_AMBULATORY_CARE_PROVIDER_SITE_OTHER): Payer: Medicaid Other | Admitting: Adult Health

## 2014-09-11 VITALS — BP 104/60 | Ht 66.0 in | Wt 306.0 lb

## 2014-09-11 DIAGNOSIS — B9689 Other specified bacterial agents as the cause of diseases classified elsewhere: Secondary | ICD-10-CM

## 2014-09-11 DIAGNOSIS — N760 Acute vaginitis: Secondary | ICD-10-CM

## 2014-09-11 DIAGNOSIS — Z30011 Encounter for initial prescription of contraceptive pills: Secondary | ICD-10-CM

## 2014-09-11 DIAGNOSIS — A499 Bacterial infection, unspecified: Secondary | ICD-10-CM

## 2014-09-11 DIAGNOSIS — N898 Other specified noninflammatory disorders of vagina: Secondary | ICD-10-CM | POA: Insufficient documentation

## 2014-09-11 DIAGNOSIS — Z309 Encounter for contraceptive management, unspecified: Secondary | ICD-10-CM | POA: Insufficient documentation

## 2014-09-11 HISTORY — DX: Other specified bacterial agents as the cause of diseases classified elsewhere: B96.89

## 2014-09-11 HISTORY — DX: Encounter for contraceptive management, unspecified: Z30.9

## 2014-09-11 HISTORY — DX: Other specified noninflammatory disorders of vagina: N89.8

## 2014-09-11 MED ORDER — METRONIDAZOLE 500 MG PO TABS: 500.0000 mg | ORAL_TABLET | Freq: Two times a day (BID) | ORAL | Status: DC

## 2014-09-11 MED ORDER — NORETHINDRONE 0.35 MG PO TABS: 1.0000 | ORAL_TABLET | Freq: Every day | ORAL | Status: DC

## 2014-09-11 NOTE — Patient Instructions (Signed)
Bacterial Vaginosis Bacterial vaginosis is a vaginal infection that occurs when the normal balance of bacteria in the vagina is disrupted. It results from an overgrowth of certain bacteria. This is the most common vaginal infection in women of childbearing age. Treatment is important to prevent complications, especially in pregnant women, as it can cause a premature delivery. CAUSES  Bacterial vaginosis is caused by an increase in harmful bacteria that are normally present in smaller amounts in the vagina. Several different kinds of bacteria can cause bacterial vaginosis. However, the reason that the condition develops is not fully understood. RISK FACTORS Certain activities or behaviors can put you at an increased risk of developing bacterial vaginosis, including:  Having a new sex partner or multiple sex partners.  Douching.  Using an intrauterine device (IUD) for contraception. Women do not get bacterial vaginosis from toilet seats, bedding, swimming pools, or contact with objects around them. SIGNS AND SYMPTOMS  Some women with bacterial vaginosis have no signs or symptoms. Common symptoms include:  Grey vaginal discharge.  A fishlike odor with discharge, especially after sexual intercourse.  Itching or burning of the vagina and vulva.  Burning or pain with urination. DIAGNOSIS  Your health care provider will take a medical history and examine the vagina for signs of bacterial vaginosis. A sample of vaginal fluid may be taken. Your health care provider will look at this sample under a microscope to check for bacteria and abnormal cells. A vaginal pH test may also be done.  TREATMENT  Bacterial vaginosis may be treated with antibiotic medicines. These may be given in the form of a pill or a vaginal cream. A second round of antibiotics may be prescribed if the condition comes back after treatment.  HOME CARE INSTRUCTIONS   Only take over-the-counter or prescription medicines as  directed by your health care provider.  If antibiotic medicine was prescribed, take it as directed. Make sure you finish it even if you start to feel better.  Do not have sex until treatment is completed.  Tell all sexual partners that you have a vaginal infection. They should see their health care provider and be treated if they have problems, such as a mild rash or itching.  Practice safe sex by using condoms and only having one sex partner. SEEK MEDICAL CARE IF:   Your symptoms are not improving after 3 days of treatment.  You have increased discharge or pain.  You have a fever. MAKE SURE YOU:   Understand these instructions.  Will watch your condition.  Will get help right away if you are not doing well or get worse. FOR MORE INFORMATION  Centers for Disease Control and Prevention, Division of STD Prevention: Solution[employer]s.co.zawww.cdc.gov/std American Sexual Health Association (ASHA): www.ashastd.org  Document Released: 07/07/2005 Document Revised: 04/27/2013 Document Reviewed: 02/16/2013 [facility] Patient Information 2015 [place], [place]. This information is not intended to replace advice given to you by your health care provider. Make sure you discuss any questions you have with your health care provider. Return in 2 weeks for mole removal Pap and physical in 7 weeks Start micronor with next period Use condoms  Take flagyl

## 2014-09-11 NOTE — Progress Notes (Signed)
Subjective:     Patient ID: Sierra Heath, female   DOB: 11-08-1990, 24 y.o.   MRN: [id number]  HPI Revonda Standardllison is a 24 year old white female in complaining of vaginal discharge with odor at times, had trich in past.Wants to get on birth control , has history of migraines with ?aura.Also complains of weight gain.  Review of Systems Patient denies any  hearing loss, fatigue, blurred vision, shortness of breath, chest pain, abdominal pain, problems with bowel movements, urination, or intercourse. No joint pain or mood swings.See HPI for positives.  Reviewed past medical,surgical, social and family history. Reviewed medications and allergies.     Objective:   Physical Exam BP 104/60 mmHg  Ht 5\' 6"  (1.676 m)  Wt 306 lb (138.801 kg)  BMI 49.41 kg/m2  LMP 08/25/2014   Skin warm and dry.Pelvic: external genitalia is normal in appearance, except has skin colored mole at top of clitoris and base of right labia, vagina: white discharge with odor,urethra has no lesions or masses noted, cervix:smooth and bulbous, uterus: normal size, shape and contour, non tender, no masses felt, adnexa: no masses or tenderness noted. Bladder is non tender and no masses felt. Wet prep: + for clue cells and +WBCs. GC/CHL obtained.  Discussed taking POP since has migraines and she wants to try them, and she wants moles removed.  Assessment:     Vaginal discharge BV Contraceptive management    Plan:     GC/CHL sent Rx flagyl 500 mg 1 bid x 7 days, no alcohol, review handout on BV   Rx micronor take 1 daily start with next period with 4 refills,use condoms Return in 2 weeks for mole removal with Dr Despina HiddenEure Return in 7 weeks for pap and physical and ROS of starting POP Try to decrease carbs and increase activity

## 2014-09-12 LAB — GC/CHLAMYDIA PROBE AMP
CHLAMYDIA, DNA PROBE: NEGATIVE
Neisseria gonorrhoeae by PCR: NEGATIVE

## 2014-09-15 LAB — POCT WET PREP (WET MOUNT): WBC WET PREP: POSITIVE

## 2014-09-27 ENCOUNTER — Ambulatory Visit (INDEPENDENT_AMBULATORY_CARE_PROVIDER_SITE_OTHER): Payer: Medicaid Other | Admitting: Obstetrics & Gynecology

## 2014-09-27 ENCOUNTER — Encounter: Payer: Self-pay | Admitting: Obstetrics & Gynecology

## 2014-09-27 VITALS — BP 90/60 | HR 80 | Wt 305.0 lb

## 2014-09-27 DIAGNOSIS — N907 Vulvar cyst: Secondary | ICD-10-CM | POA: Diagnosis not present

## 2014-09-27 DIAGNOSIS — L918 Other hypertrophic disorders of the skin: Secondary | ICD-10-CM

## 2014-09-27 DIAGNOSIS — N9089 Other specified noninflammatory disorders of vulva and perineum: Secondary | ICD-10-CM

## 2014-09-27 NOTE — Progress Notes (Signed)
Patient ID: Sierra Heath, female   DOB: [birth year], 24 y.o.   MRN: [id number] Chief Complaint  Patient presents with  . mole removal   3 skin tags identified:  1 right axilla, 1 right inner thigh, i mons pubis, midline  All 3 skin tags were removed with scissors sharply  Small bleeding of all 3, silver nitrate placed x 3 All 3 hemostatic  No follow up needed No pathology evaluation needed

## 2014-09-29 ENCOUNTER — Encounter (HOSPITAL_COMMUNITY): Payer: Self-pay

## 2014-09-29 ENCOUNTER — Emergency Department (HOSPITAL_COMMUNITY)
Admission: EM | Admit: 2014-09-29 | Discharge: 2014-09-29 | Disposition: A | Discharge status: Home / Self Care | Payer: Medicaid Other | Attending: Emergency Medicine | Admitting: Emergency Medicine

## 2014-09-29 DIAGNOSIS — Z9889 Other specified postprocedural states: Secondary | ICD-10-CM | POA: Diagnosis not present

## 2014-09-29 DIAGNOSIS — G8929 Other chronic pain: Secondary | ICD-10-CM | POA: Diagnosis not present

## 2014-09-29 DIAGNOSIS — Z87448 Personal history of other diseases of urinary system: Secondary | ICD-10-CM | POA: Diagnosis not present

## 2014-09-29 DIAGNOSIS — K219 Gastro-esophageal reflux disease without esophagitis: Secondary | ICD-10-CM | POA: Insufficient documentation

## 2014-09-29 DIAGNOSIS — E669 Obesity, unspecified: Secondary | ICD-10-CM | POA: Insufficient documentation

## 2014-09-29 DIAGNOSIS — K649 Unspecified hemorrhoids: Secondary | ICD-10-CM | POA: Diagnosis present

## 2014-09-29 DIAGNOSIS — Z79899 Other long term (current) drug therapy: Secondary | ICD-10-CM | POA: Diagnosis not present

## 2014-09-29 DIAGNOSIS — Z8619 Personal history of other infectious and parasitic diseases: Secondary | ICD-10-CM | POA: Insufficient documentation

## 2014-09-29 NOTE — Discharge Instructions (Signed)
°Emergency Department Resource Guide °1) Find a Doctor and Pay Out of Pocket °Although you won't have to find out who is covered by your insurance plan, it is a good idea to ask around and get recommendations. You will then need to call the office and see if the doctor you have chosen will accept you as a new patient and what types of options they offer for patients who are self-pay. Some doctors offer discounts or will set up payment plans for their patients who do not have insurance, but you will need to ask so you aren't surprised when you get to your appointment. ° °2) Contact Your Local Health Department °Not all health departments have doctors that can see patients for sick visits, but many do, so it is worth a call to see if yours does. If you don't know where your local health department is, you can check in your phone book. The CDC also has a tool to help you locate your state's health department, and many state websites also have listings of all of their local health departments. ° °3) Find a Walk-in Clinic °If your illness is not likely to be very severe or complicated, you may want to try a walk in clinic. These are popping up all over the country in pharmacies, drugstores, and shopping centers. They're usually staffed by nurse practitioners or physician assistants that have been trained to treat common illnesses and complaints. They're usually fairly quick and inexpensive. However, if you have serious medical issues or chronic medical problems, these are probably not your best option. ° °No Primary Care Doctor: °- Call [facility] Connect at  832-8000 - they can help you locate a primary care doctor that  accepts your insurance, provides certain services, etc. °- Physician Referral Service- 1-800-533-3463 ° °Chronic Pain Problems: °Organization         Address  Phone   Notes  °[facility]  (336) 297-2271 Patients need to be referred by their primary care doctor.  ° °Medication  Assistance: °Organization         Address  Phone   Notes  °[place] [facility] [address] °[place], [place] [address] (336) 641-8030 --Must be a resident of [place] °-- Must have NO insurance coverage whatsoever (no Medicaid/ Medicare, etc.) °-- The pt. MUST have a primary care doctor that directs their care regularly and follows them in the community °  °[facility]  (866) 331-1348   °[employer]  (888) 892-1162   ° °Agencies that provide inexpensive medical care: °Organization         Address  Phone   Notes  °[facility]  (336) 832-8035   °[facility]    (336) 832-7272   °[facility] [address] °[place], [place] [address] (336) 832-4777   °[facility] [address], °[place] (336) 271-4999   °[facility]    (336) 373-0678   °[facility]    (336) 272-1050   °[facility] ° [address], [place] Phone:  (336) 832-4444, Fax:  (336) 832-4440 Hours of Operation:  9 am - 6 pm, M-F.  Also accepts Medicaid/Medicare and self-pay.  °[facility] ° [address], [place] Phone: (336) 832-3150, Fax: (336) 832-3151. Hours of Operation:  8:30 am - 5:30 pm, M-F.  Also accepts Medicaid and self-pay.  °[facility] [place] [address], [place] Phone: (336) 878-6027   °[facility] [address], [place], [place] (336)723-1848, Ext. 123 Mondays & Thursdays: 7-9 AM.  First 15 patients are seen on a first come, first serve basis. °  ° °Medicaid-accepting [place] Providers: ° °Organization         Address  Phone   Notes  °[facility] [address], [place] (336) 641-2100 Also accepts self-pay patients.  °[facility] [address], [place] ° (336) 856-9996   °[facility] [address], [place]  (336) 288-8857   °[facility] [address] [place] [address], [place] (336) 299-7000   °Veita Bland [address], [place]  ° (336) 373-1557 Only accepts [place] Access Medicaid patients after they have their name applied to their card.  ° °Self-Pay (no insurance) in [place]: ° °Organization         Address  Phone   Notes  °Sickle Cell Patients, [facility] [address], [place] (336) 832-1970   °[facility] [address], [place] (336) 832-4400   °[facility] ° [address], [place] (336) 992-4800   °[facility]/Dr. Osei-Bonsu ° [address] [place] [address], [place] or [address], [place] (336) 841-8500 Phone number for both [place] and [place] locations is the same.  °[facility] [address], [place] (336) 299-0000   °[facility] [address] [place] [address], [place] or [address] (336) 852-7530 °(336) 878-2260   °[facility] [address], [place] (336) 350-1642, phone; (336) 294-5005, fax Sees patients 1st and 3rd Saturday of every month.  Must not qualify for public or private insurance (i.e. Medicaid, Medicare, [place] Health Choice, Veterans' Benefits) • Household income should be no more than 200% of the poverty level •The clinic cannot treat you if you are pregnant or think you are pregnant • Sexually transmitted diseases are not treated at the clinic.  ° ° °Dental Care: °Organization         Address  Phone  Notes  °[place] [facility] [address], [place] (336) 641-6152 Accepts children up to age 21 who are enrolled in Medicaid or [place] Health Choice; pregnant women with a Medicaid card; and children who have applied for Medicaid or [place] Health Choice, but were declined, whose parents can pay a reduced fee at time of service.  °[place]  [facility] [place]  [address], [place] (336) 641-7733 Accepts children up to age 21 who are enrolled in Medicaid or [place] Health Choice; pregnant women with a Medicaid card; and children who have applied for Medicaid or [place] Health Choice, but were declined, whose parents can pay a reduced fee at time of service.  °[facility] ° [address], [place] (336) 641-4533 Patients are seen by appointment only. Walk-ins are not accepted. [facility] will see patients 18 years of age and older. °Monday - Tuesday (8am-5pm) °Most Wednesdays (8:30-5pm) °$30 per visit, cash only  °[facility] ° [address], [place] (336) 641-4533 Patients are seen by appointment only. Walk-ins are not accepted. [facility] will see patients 18 years of age and older. °One   Wednesday Evening (Monthly: Volunteer Based).  $30 per visit, cash only  °[facility]  (919) 537-3737 for adults; Children under age 4, call Graduate Pediatric Dentistry at ([phone]) [phone]. Children aged 4-14, please call (919) [phone] to request a pediatric application. ° Dental services are provided in all areas of dental care including fillings, crowns and bridges, complete and partial dentures, implants, gum treatment, root canals, and extractions. Preventive care is also provided. Treatment is provided to both adults and children. °Patients are selected via a lottery and there is often a waiting list. °  °[facility] [address], °[place] ° (336) 763-8833 [email or web address] °  °[facility] [address], [place], [place] (336)723-1848, Ext. 123 Second and Fourth Thursday of each month, opens at 6:30 AM; Clinic ends at 9 AM.  Patients are seen on a first-come first-served basis, and a limited number are seen during each clinic.  ° °[facility] ° [address], [place], [place] (336) 723-7904    Eligibility Requirements °You must have lived in [place], [place], or [place] counties for at least the last three months. °  You cannot be eligible for state or federal sponsored healthcare insurance, including Veterans Administration, Medicaid, or Medicare. °  You generally cannot be eligible for healthcare insurance through your employer.  °  How to apply: °Eligibility screenings are held every Tuesday and Wednesday afternoon from 1:00 pm until 4:00 pm. You do not need an appointment for the interview!  °[facility] [address], [place], [place] 336-631-2330   °[facility]  336-342-8273   °[facility]  336-703-3100   °[facility]  336-570-6415   ° °Behavioral Health Resources in the Community: °Intensive Outpatient Programs °Organization         Address  Phone  Notes  °[facility] [address], [place], [place] 336-878-6098   °[facility] [address], [place], [place] 336-832-9800   °[facility] [address], [place], [place] ° 336-882-2125   °[facility] [address],  °[place], [place] 1-800-853-5163 or 336-641-4981   °Substance Abuse Resources °Organization         Address  Phone  Notes  °[facility]  336-882-2125   °[facility]  336-784-9470   °[facility]  336-285-9073   °[facility]  336-845-3988   °Residential & Outpatient Substance Abuse Program  1-800-659-3381   °Psychological Services °Organization         Address  Phone  Notes  °[facility]  336- 832-9600   °[facility]  336- 378-7881   °[facility] [address], [place] 1-800-853-5163 or 336-641-4981   ° °Mobile Crisis Teams °Organization         Address  Phone  Notes  °[facility], Mobile Crisis Care Unit  1-877-626-1772   °Assertive °[facility] ° [address]  [place], [place] 336-834-9664   °Sharon DeEsch [address] °[place] 336-554-5454   ° °Self-Help/Support Groups °Organization         Address  Phone             Notes  °[facility] of  - variety of support groups  336- 373-1402 Call for more information  °Narcotics Anonymous (NA), [facility] [address], °[place]  2 meetings at this location  ° °  Residential Treatment Programs Organization         Address  Phone  Notes  [facility] [address],    [place]  [phone]   [facility]  [address], [place] [phone], [place], [place] [phone]   [facility] [address], [place] [phone] Admissions: 8am-3pm M-F  [facility] [address]    [place], [place] [phone]   [facility] [address], [place], [place] [phone]   [facility] [address]  [place], [place] [phone]   [facility] Intensive Outpatient [address], [place], [place] [phone]   [facility] [address]  [place], [place] [phone] or [phone]   [facility] [address] [place], [place] [phone] Accepts Medicaid  [facility] [address]  [place] [phone]) [phone] Substance Abuse/Addiction Treatment   [facility] Organization         Address  Phone  Notes  [facility]  (216)692-1279(888) [phone]   Angie FavaJulie Brannon, PhD [address], [address] [place], [place]   [phone](336) [phone] or (229)[phone](336) [phone]   [facility]   [address] [place], [place] ([phone](336) [phone]   [facility] [address], [place], [place] (787) [phone](336) [phone] Insurance/Medicaid/sponsorship through [facility] [address]                                    [place], [place] [phone]) [phone] Therapy/tele-psych/case    [facility] [address]   [place], [place] [phone](336) [phone]    Dr. Lolly MustacheArfeen  [phone](336) [phone]   [facility]  [employer] [facility] 1) [address], [place] 2) [address], [place] 3)  [address], [place] [phone](336) [phone](336) [phone]  [phone](336) [phone]   [facility] Child Abuse Hotline 747-092-5936(336) [phone] or [phone](336) [phone] (After Hours)      Continue to take your usual prescriptions as previously directed.  Begin to take over the counter fiber products (ie: Citucel, Metamucil) or stool softener (colace), as directed on packaging, for the next month.  Use over the counter hemorrhoidal relief products (ie:  preparation H, anusol), as directed on packaging, as needed for symptoms.  Sit in a warm water tub several times per day for the next week.  Call your regular medical doctor and the GI doctor today to schedule a follow up appointment within the next week.  Return to the Emergency Department immediately if worsening.

## 2014-09-29 NOTE — ED Notes (Signed)
Pt states she has hemorrhoids for the past week, states recently passing more blood and is more painful

## 2014-09-29 NOTE — ED Provider Notes (Signed)
CSN: [id number]     Arrival date & time 09/29/14  0617 History   First MD Initiated Contact with Patient 09/29/14 [phone]     Chief Complaint  Patient presents with  . Hemorrhoids      HPI Pt was seen at 0715. Per pt, c/o gradual onset and persistence of constant acute flair of her chronic "hemorrhoid" for the past 4 years, worse over the past 1 week. Pt was evaluated by her OB/GYN 1 week ago for same, dx hemorrhoid, tx symptomatically. Pt states "it's still there." Pt states she has been passing "hard stools" which cause her rectal area to "hurt more." Denies fevers, no mass, no abd pain, no rectal discharge, no blood in stools.    Past Medical History  Diagnosis Date  . Migraine   . Back pain, chronic   . Acid reflux   . Sleep apnea   . History of chlamydia   . Vaginal discharge 09/11/2014  . BV (bacterial vaginosis) 09/11/2014  . Contraceptive management 09/11/2014  . Obesity    Past Surgical History  Procedure Laterality Date  . Tonsillectomy    . Hernia repair    . Cesarean section    . Wisdom tooth extraction     Family History  Problem Relation Age of Onset  . Hypertension Mother   . Hyperlipidemia Mother   . Cancer Paternal Grandmother   . Diabetes Paternal Grandmother    History  Substance Use Topics  . Smoking status: Never Smoker   . Smokeless tobacco: Not on file  . Alcohol Use: No   OB History    Gravida Para Term Preterm AB TAB SAB Ectopic Multiple Living   1 1 1             Review of Systems ROS: Statement: All systems negative except as marked or noted in the HPI; Constitutional: Negative for fever and chills. ; ; Eyes: Negative for eye pain, redness and discharge. ; ; ENMT: Negative for ear pain, hoarseness, nasal congestion, sinus pressure and sore throat. ; ; Cardiovascular: Negative for chest pain, palpitations, diaphoresis, dyspnea and peripheral edema. ; ; Respiratory: Negative for cough, wheezing and stridor. ; ; Gastrointestinal: Negative for  nausea, vomiting, diarrhea, abdominal pain, blood in stool, hematemesis, jaundice and +rectal bleeding. . ; ; Genitourinary: Negative for dysuria, flank pain and hematuria. ; ; Musculoskeletal: Negative for back pain and neck pain. Negative for swelling and trauma.; ; Skin: Negative for pruritus, rash, abrasions, blisters, bruising and skin lesion.; ; Neuro: Negative for headache, lightheadedness and neck stiffness. Negative for weakness, altered level of consciousness , altered mental status, extremity weakness, paresthesias, involuntary movement, seizure and syncope.      Allergies  Hydrocodone  Home Medications   Prior to Admission medications   Medication Sig Start Date End Date Taking? Authorizing Provider  ALPRAZolam Prudy Feeler(XANAX) 1 MG tablet Take 1 mg by mouth as needed for anxiety.   Yes Historical Provider, MD  norethindrone (MICRONOR,CAMILA,ERRIN) 0.35 MG tablet Take 1 tablet (0.35 mg total) by mouth daily. 09/11/14  Yes Adline PotterJennifer A Griffin, NP  omeprazole (PRILOSEC) 20 MG capsule Take 20 mg by mouth 2 (two) times daily.   Yes Historical Provider, MD  oxyCODONE-acetaminophen (PERCOCET) 10-325 MG per tablet Take 1 tablet by mouth every 6 (six) hours.   Yes Historical Provider, MD  metroNIDAZOLE (FLAGYL) 500 MG tablet Take 1 tablet (500 mg total) by mouth 2 (two) times daily. Patient not taking: Reported on 09/27/2014 09/11/14   Ginette PitmanJennifer A  Valentina Lucks, NP   BP 111/55 mmHg  Pulse 83  Temp(Src) 97.8 F (36.6 C) (Oral)  Resp 18  Ht  (1.676 m)  Wt 300 lb (136.079 kg)  BMI 48.44 kg/m2  SpO2 100%  LMP 09/22/2014 Physical Exam  0720; Physical examination:  Nursing notes reviewed; Vital signs and O2 SAT reviewed;  Constitutional: Well developed, Well nourished, Well hydrated, In no acute distress; Head:  Normocephalic, atraumatic; Eyes: EOMI, PERRL, No scleral icterus; ENMT: Mouth and pharynx normal, Mucous membranes moist; Neck: Supple, Full range of motion, No lymphadenopathy; Cardiovascular:  Regular rate and rhythm, No murmur, rub, or gallop; Respiratory: Breath sounds clear & equal bilaterally, No rales, rhonchi, wheezes.  Speaking full sentences with ease, Normal respiratory effort/excursion; Chest: Nontender, Movement normal; Abdomen: Soft, Nontender, Nondistended, Normal bowel sounds. Rectal exam performed w/permission of pt and ED RN chaperone present.  Anal tone normal. No fissures, +small external hemorrhoid without thrombosis or bleeding, no palp masses.; Genitourinary: No CVA tenderness; Extremities: Pulses normal, No tenderness, No edema, No calf edema or asymmetry.; Neuro: AA&Ox3, Major CN grossly intact.  Speech clear. No gross focal motor or sensory deficits in extremities.; Skin: Color normal, Warm, Dry.   ED Course  Procedures     EKG Interpretation None      MDM  MDM Reviewed: previous chart, nursing note and vitals     0725:  Very small external hemorrhoid. No active bleeding or thrombosis. Tx symptomatically, f/u GI MD. Dx d/w pt.  Questions answered.  Verb understanding, agreeable to d/c home with outpt f/u.   Samuel Jester, DO 10/01/14 [place]

## 2014-10-03 ENCOUNTER — Ambulatory Visit (INDEPENDENT_AMBULATORY_CARE_PROVIDER_SITE_OTHER): Payer: Medicaid Other | Admitting: Adult Health

## 2014-10-03 ENCOUNTER — Encounter: Payer: Self-pay | Admitting: Adult Health

## 2014-10-03 VITALS — BP 100/60 | HR 88 | Ht 66.0 in | Wt 303.0 lb

## 2014-10-03 DIAGNOSIS — K649 Unspecified hemorrhoids: Secondary | ICD-10-CM | POA: Diagnosis not present

## 2014-10-03 DIAGNOSIS — K629 Disease of anus and rectum, unspecified: Secondary | ICD-10-CM | POA: Diagnosis not present

## 2014-10-03 DIAGNOSIS — K602 Anal fissure, unspecified: Secondary | ICD-10-CM

## 2014-10-03 HISTORY — DX: Unspecified hemorrhoids: K64.9

## 2014-10-03 HISTORY — DX: Anal fissure, unspecified: K60.2

## 2014-10-03 NOTE — Progress Notes (Signed)
Subjective:     Patient ID: Sierra Heath, female   DOB: September 02, 1990, 24 y.o.   MRN: [id number]  HPI Revonda Standardllison is a 24 year old white female in complaining of hemorrhoids and pain in rectum, [facility].She has wiped some blood.Has been using stool softener.  Review of Systems +hemorrhoids with bleeding noted, +pain in rectum Reviewed past medical,surgical, social and family history. Reviewed medications and allergies.     Objective:   Physical Exam BP 100/60 mmHg  Pulse 88  Ht 5\' 6"  (1.676 m)  Wt 303 lb (137.44 kg)  BMI 48.93 kg/m2  LMP 03/04/2016Skin warm and dry, on rectal exam has deflated external hemorrhoid and she will not let me pass finger all the way in rectum,but on visualization has ?fissure on left and small internal hemorrhoid, does not appear to be thrombosed.    Assessment:     Hemorrhoids Small rectal fissure    Plan:     Soak in warm tub with episom salt and use wet wipes Rx [facility] hemorrhoid cream #30 gm use 3-4 x daily Follow up in 1 week Review handout on hemorrhoids Drink 4-6 oz prune juice daily til BM soft

## 2014-10-03 NOTE — Patient Instructions (Signed)
Hemorrhoids Hemorrhoids are swollen veins around the rectum or anus. There are two types of hemorrhoids:   Internal hemorrhoids. These occur in the veins just inside the rectum. They may poke through to the outside and become irritated and painful.  External hemorrhoids. These occur in the veins outside the anus and can be felt as a painful swelling or hard lump near the anus. CAUSES  Pregnancy.   Obesity.   Constipation or diarrhea.   Straining to have a bowel movement.   Sitting for long periods on the toilet.  Heavy lifting or other activity that caused you to strain.  Anal intercourse. SYMPTOMS   Pain.   Anal itching or irritation.   Rectal bleeding.   Fecal leakage.   Anal swelling.   One or more lumps around the anus.  DIAGNOSIS  Your caregiver may be able to diagnose hemorrhoids by visual examination. Other examinations or tests that may be performed include:   Examination of the rectal area with a gloved hand (digital rectal exam).   Examination of anal canal using a small tube (scope).   A blood test if you have lost a significant amount of blood.  A test to look inside the colon (sigmoidoscopy or colonoscopy). TREATMENT Most hemorrhoids can be treated at home. However, if symptoms do not seem to be getting better or if you have a lot of rectal bleeding, your caregiver may perform a procedure to help make the hemorrhoids get smaller or remove them completely. Possible treatments include:   Placing a rubber band at the base of the hemorrhoid to cut off the circulation (rubber band ligation).   Injecting a chemical to shrink the hemorrhoid (sclerotherapy).   Using a tool to burn the hemorrhoid (infrared light therapy).   Surgically removing the hemorrhoid (hemorrhoidectomy).   Stapling the hemorrhoid to block blood flow to the tissue (hemorrhoid stapling).  HOME CARE INSTRUCTIONS   Eat foods with fiber, such as whole grains, beans,  nuts, fruits, and vegetables. Ask your doctor about taking products with added fiber in them (fibersupplements).  Increase fluid intake. Drink enough water and fluids to keep your urine clear or pale yellow.   Exercise regularly.   Go to the bathroom when you have the urge to have a bowel movement. Do not wait.   Avoid straining to have bowel movements.   Keep the anal area dry and clean. Use wet toilet paper or moist towelettes after a bowel movement.   Medicated creams and suppositories may be used or applied as directed.   Only take over-the-counter or prescription medicines as directed by your caregiver.   Take warm sitz baths for 15-20 minutes, 3-4 times a day to ease pain and discomfort.   Place ice packs on the hemorrhoids if they are tender and swollen. Using ice packs between sitz baths may be helpful.   Put ice in a plastic bag.   Place a towel between your skin and the bag.   Leave the ice on for 15-20 minutes, 3-4 times a day.   Do not use a donut-shaped pillow or sit on the toilet for long periods. This increases blood pooling and pain.  SEEK MEDICAL CARE IF:  You have increasing pain and swelling that is not controlled by treatment or medicine.  You have uncontrolled bleeding.  You have difficulty or you are unable to have a bowel movement.  You have pain or inflammation outside the area of the hemorrhoids. MAKE SURE YOU:  Understand these instructions.    Will watch your condition.  Will get help right away if you are not doing well or get worse. Document Released: 07/04/2000 Document Revised: 06/23/2012 Document Reviewed: 05/11/2012 [facility] Patient Information 2015 [employer] [place]. This information is not intended to replace advice given to you by your health care provider. Make sure you discuss any questions you have with your health care provider. Use cream 3-4 x daily Do warm tub bath with episom salt Recheck in 1 week Use wet wipes   Drink 4-6 oz per day til BM soft

## 2014-10-05 ENCOUNTER — Encounter: Payer: Self-pay | Admitting: Gastroenterology

## 2014-10-11 ENCOUNTER — Ambulatory Visit (INDEPENDENT_AMBULATORY_CARE_PROVIDER_SITE_OTHER): Payer: Medicaid Other | Admitting: Adult Health

## 2014-10-11 ENCOUNTER — Encounter: Payer: Self-pay | Admitting: Adult Health

## 2014-10-11 VITALS — BP 102/68 | HR 108 | Ht 66.0 in | Wt 304.0 lb

## 2014-10-11 DIAGNOSIS — R110 Nausea: Secondary | ICD-10-CM | POA: Diagnosis not present

## 2014-10-11 DIAGNOSIS — Z1212 Encounter for screening for malignant neoplasm of rectum: Secondary | ICD-10-CM

## 2014-10-11 DIAGNOSIS — R197 Diarrhea, unspecified: Secondary | ICD-10-CM | POA: Diagnosis not present

## 2014-10-11 DIAGNOSIS — Z3202 Encounter for pregnancy test, result negative: Secondary | ICD-10-CM | POA: Diagnosis not present

## 2014-10-11 DIAGNOSIS — K629 Disease of anus and rectum, unspecified: Secondary | ICD-10-CM

## 2014-10-11 DIAGNOSIS — K602 Anal fissure, unspecified: Secondary | ICD-10-CM

## 2014-10-11 DIAGNOSIS — K649 Unspecified hemorrhoids: Secondary | ICD-10-CM | POA: Diagnosis not present

## 2014-10-11 DIAGNOSIS — R195 Other fecal abnormalities: Secondary | ICD-10-CM

## 2014-10-11 HISTORY — DX: Diarrhea, unspecified: R19.7

## 2014-10-11 HISTORY — DX: Nausea: R11.0

## 2014-10-11 LAB — POCT URINE PREGNANCY: PREG TEST UR: NEGATIVE

## 2014-10-11 LAB — HEMOCCULT GUIAC POC 1CARD (OFFICE): FECAL OCCULT BLD: POSITIVE

## 2014-10-11 MED ORDER — PROMETHAZINE HCL 25 MG PO TABS: 25.0000 mg | ORAL_TABLET | Freq: Four times a day (QID) | ORAL | Status: DC | PRN

## 2014-10-11 NOTE — Patient Instructions (Signed)
Eat light  See Dr Jena Gaussourk Use cream

## 2014-10-11 NOTE — Progress Notes (Signed)
Subjective:     Patient ID: Sierra Heath, female   DOB: [birth year], 24 y.o.   Sierra Heath: [id number]  HPI Sierra Heath is a 24 year old white female back in follow up of rectal fissure and is better but has stomach ache and  diarrhea and nausea, had hot sauce yesterday.  Review of Systems +diarrhea and nausea +pain in rectal area Reviewed past medical,surgical, social and family history. Reviewed medications and allergies.     Objective:   Physical Exam BP 102/68 mmHg  Pulse 108  Ht 5\' 6"  (1.676 m)  Wt 304 lb (137.893 kg)  BMI 49.09 kg/m2  LMP 03/04/2016Skin warm and dry, abdomen has BS in all 4 quadrants and soft has some tenderness epigastric area,on rectal + fissure still, but not as tender and + hemorrhoid, + hemoccult today.Has appt with Dr Jena Gaussourk 4/12.   UPT negative.  Assessment:    Rectal fissure Hemorrhoids Diarrhea  Nausea  +hemoccult     Plan:     Rx phenergan 25 mg #30 1 every 6 hour prn with 1 refill Eat light See Dr Jena Gaussourk, referral sent to see if can see sooner Keep using cream for fissure and hemorrhoids   Follow up prn

## 2014-10-17 ENCOUNTER — Ambulatory Visit: Payer: Medicaid Other | Admitting: Nurse Practitioner

## 2014-10-30 ENCOUNTER — Other Ambulatory Visit: Payer: Medicaid Other | Admitting: Adult Health

## 2014-10-31 ENCOUNTER — Ambulatory Visit: Payer: Medicaid Other | Admitting: Nurse Practitioner

## 2014-11-09 ENCOUNTER — Ambulatory Visit (INDEPENDENT_AMBULATORY_CARE_PROVIDER_SITE_OTHER): Payer: Medicaid Other | Admitting: Nurse Practitioner

## 2014-11-09 ENCOUNTER — Encounter: Payer: Self-pay | Admitting: Nurse Practitioner

## 2014-11-09 ENCOUNTER — Other Ambulatory Visit: Payer: Self-pay

## 2014-11-09 VITALS — BP 117/54 | HR 77 | Temp 98.1°F | Ht 66.0 in | Wt 302.8 lb

## 2014-11-09 DIAGNOSIS — K629 Disease of anus and rectum, unspecified: Secondary | ICD-10-CM | POA: Diagnosis not present

## 2014-11-09 DIAGNOSIS — K6289 Other specified diseases of anus and rectum: Secondary | ICD-10-CM

## 2014-11-09 DIAGNOSIS — K625 Hemorrhage of anus and rectum: Secondary | ICD-10-CM

## 2014-11-09 DIAGNOSIS — K649 Unspecified hemorrhoids: Secondary | ICD-10-CM

## 2014-11-09 DIAGNOSIS — K602 Anal fissure, unspecified: Secondary | ICD-10-CM

## 2014-11-09 MED ORDER — NITROGLYCERIN 0.4 % RE OINT: 1.0000 "application " | TOPICAL_OINTMENT | Freq: Two times a day (BID) | RECTAL | Status: DC

## 2014-11-09 MED ORDER — PEG-KCL-NACL-NASULF-NA ASC-C 100 G PO SOLR: 1.0000 | Freq: Once | ORAL | Status: AC

## 2014-11-09 MED ORDER — HYDROCORTISONE 2.5 % RE CREA: 1.0000 "application " | TOPICAL_CREAM | Freq: Two times a day (BID) | RECTAL | Status: DC

## 2014-11-09 NOTE — Assessment & Plan Note (Addendum)
24 year old female with a history of hemorrhoids and anal fissure per which she was told by her PCP. Symptoms began approximately 2-3 months ago and does admit single episode of anal sex sometime within that time period. Hemorrhoid symptoms are severe when she states that she often cries during bowel movements and symptoms attributed even by passing flatus. She has found that she will often try to hold her bowel movements to prevent having to have a bowel movement and subsequent pain. Also admits bright red blood on but the stools and in the toilet and occasionally will notice on the toilet tissue as well. Pain and blood happens with every bowel movement. Today we'll check a CBC as well as simper prescription for Anusol rectal cream and attrition rectal cream both twice a day alternated for hemorrhoids and probable anal fissure. However, the amount of blood she is reporting as well as the severity of her symptoms seems out of proportion to the hemorrhoids noted on her rectal exam. For this reason we will proceed with a colonoscopy to evaluate for other possible etiology of rectal bleeding and pain.  Proceed with colonoscopy with 25mg  pre-procedure phenergan with Dr. Darrick PennaFields in the near future. The risks, benefits, and alternatives have been discussed in detail with the patient. They state understanding and desire to proceed.   The patient is not on any anticoagulants or depression medications. She is not on any long-term pain medications. She is on Xanax and Adderall.  Return for follow-up in 6-8 weeks.

## 2014-11-09 NOTE — Progress Notes (Signed)
Primary Care Physician:  Evlyn CourierHILL,GERALD K, MD Primary Gastroenterologist:  Dr. Darrick PennaFields  Chief Complaint  Patient presents with  . Hemorrhoids    HPI:   24 year old female referred by primary care for chronically bleeding hemorrhoids. She is never had a colonoscopy. Today she states her symptoms started 2 months ago. She is having rectal pain with bowel movements and even gas. She describes her pain as "it's like something is in there and is trying to come out but won't." She states her pain occurs with every bowel movement. Pain is sharp/stabbing. She often will try and hold her bowel movements because she's scared of the pain. She has bleeding with every bowel movement, is bright red, on the stool and in the toilet water as well as minimal to no blood on the tissue. Deniess melena. Admits abdominal pain a couple times a week which is described as 'crampy." Typically has a bowel movement once every other day which is a change from her previous regular of 1-2 times a day. Her stools are typically 2-3 on the [facility]. Drinks water all day at work. Eats minimal fiber foods and no fiber supplement. Used to take Colace but does not currently. Her PCP states she did have a fissure which resolved about 1 month ago. States her pain is still similar to when she had the fissure. Denies fever, chills, unintentional weight loss, fatigue, weakness, light-headedness, dizziness. Denies any other upper or lower GI symptoms. Does admit to an episode of anal sex about 2-3 months ago.  Past Medical History  Diagnosis Date  . Migraine   . Back pain, chronic   . Acid reflux   . Sleep apnea   . History of chlamydia   . Vaginal discharge 09/11/2014  . BV (bacterial vaginosis) 09/11/2014  . Contraceptive management 09/11/2014  . Obesity   . Hemorrhoids 10/03/2014  . Rectal fissure 10/03/2014  . Diarrhea 10/11/2014  . Nausea 10/11/2014    Past Surgical History  Procedure Laterality Date  . Tonsillectomy    .  Hernia repair      age 24  . Cesarean section    . Wisdom tooth extraction      Current Outpatient Prescriptions  Medication Sig Dispense Refill  . ALPRAZolam (XANAX) 1 MG tablet Take 1 mg by mouth as needed for anxiety.    Marland Kitchen. amphetamine-dextroamphetamine (ADDERALL XR) 30 MG 24 hr capsule Take 30 mg by mouth daily.    . norethindrone (MICRONOR,CAMILA,ERRIN) 0.35 MG tablet Take 1 tablet (0.35 mg total) by mouth daily. 1 Package 3  . omeprazole (PRILOSEC) 20 MG capsule Take 20 mg by mouth 2 (two) times daily.    Marland Kitchen. oxyCODONE-acetaminophen (PERCOCET) 10-325 MG per tablet Take 1 tablet by mouth every 6 (six) hours.    . promethazine (PHENERGAN) 25 MG tablet Take 1 tablet (25 mg total) by mouth every 6 (six) hours as needed for nausea or vomiting. (Patient not taking: Reported on 11/09/2014) 30 tablet 1   No current facility-administered medications for this visit.    Allergies as of 11/09/2014 - Review Complete 11/09/2014  Allergen Reaction Noted  . Hydrocodone Hives 07/26/2014    Family History  Problem Relation Age of Onset  . Hypertension Mother   . Hyperlipidemia Mother   . Cancer Paternal Grandmother   . Diabetes Paternal Grandmother     History   Social History  . Marital Status: Single    Spouse Name: N/A  . Number of Children: N/A  .  Years of Education: N/A   Occupational History  . Not on file.   Social History Main Topics  . Smoking status: Never Smoker   . Smokeless tobacco: Never Used  . Alcohol Use: No  . Drug Use: No  . Sexual Activity: Yes    Birth Control/ Protection: Pill   Other Topics Concern  . Not on file   Social History Narrative    Review of Systems: General: Negative for weight loss, fever, chills, fatigue, weakness. Eyes: Negative for vision changes.  ENT: Negative for hoarseness, difficulty swallowing. CV: Negative for chest pain, angina, palpitations, peripheral edema.  Respiratory: Negative for dyspnea at rest, cough, sputum,  wheezing.  GI: See history of present illness. Derm: Negative for rash or itching.  Neuro: Negative for weakness, seizure, memory loss, confusion.  Psych: Negative for anxiety, depression.  Endo: Negative for unusual weight change.  Heme: Negative for bruising or bleeding. Allergy: Negative for rash or hives.    Physical Exam: BP 117/54 mmHg  Pulse 77  Temp(Src) 98.1 F (36.7 C) (Oral)  Ht  (1.676 m)  Wt 302 lb 12.8 oz (137.349 kg)  BMI 48.90 kg/m2  LMP 11/01/2014 General:   Alert and oriented. Pleasant and cooperative. Well-nourished and well-developed.  Head:  Normocephalic and atraumatic. Eyes:  Without icterus, sclera clear and conjunctiva pink.  Ears:  Normal auditory acuity. Neck:  Supple, without mass or thyromegaly. Lungs:  Clear to auscultation bilaterally. No wheezes, rales, or rhonchi. No distress.  Heart:  S1, S2 present without murmurs appreciated.  Abdomen:  +BS, obese, soft, non-tender and non-distended. No HSM noted. No guarding or rebound. No masses appreciated.  Rectal:  Small external hemorrhoids noted, 1-2 small internal hemorrhoids appreciated, no gross fissure or grossly enlarged hemorrhoids noted.  Msk:  Symmetrical without gross deformities. Normal posture. Pulses:  Normal pulses noted. Extremities:  Without clubbing or edema. Neurologic:  Alert and  oriented x4;  grossly normal neurologically. Skin:  Intact without significant lesions or rashes. Cervical Nodes:  No significant cervical adenopathy. Psych:  Alert and cooperative. Normal mood and affect.     11/09/2014 12:03 PM

## 2014-11-09 NOTE — Assessment & Plan Note (Addendum)
Patient reports of significant bleeding which is bright red in nature on both her stool and his toilet water as well as occasionally on the toilet tissue after wiping. She states this occurs with every bowel movement and the bleeding is a substantial amount. She has history of hemorrhoids and per her PCP also has had an anal fissure which is felt to have resolved approximately one month ago. She is also having considerable pain with every bowel movement as well as passing flatus. The amount of blood she describes as well as her pain symptoms are out of proportion to the hemorrhoids noted on her rectal exam. For this reason we'll check a CBC, and proceed with a colonoscopy to evaluate for further etiology of bleeding. Eckley benign anorectal source but cannot exclude more insidious process such as malignancy.  Proceed with colonoscopy with 25mg  pre-procedure phenergan with Dr. Darrick PennaFields in the near future. The risks, benefits, and alternatives have been discussed in detail with the patient. They state understanding and desire to proceed.   The patient is not on any anticoagulants or depression medications. She is not on any long-term pain medications. She is on Xanax and Adderall.  Return for follow-up in 6-8 weeks.

## 2014-11-09 NOTE — Patient Instructions (Signed)
1. We will schedule your procedure (colonoscopy) for you. 2. Further recommendations to be based on the results of your procedure. 3. Have your labs drawn within the next several days. 4. I'm calling into prescriptions for you the first one is Anusol cream to apply rectally twice a day for hemorrhoid symptoms. The second one is nitroglycerin rectal cream also applied twice a day which will help-year-old any fissure you may have. Alternate these 2 medications.

## 2014-11-09 NOTE — Assessment & Plan Note (Addendum)
Patient previously by PCP that she did have a rectal fissure. However on rectal exam no grossly notable rectal fissure was appreciated, however her symptoms are described as sharp stabbing pain with each bowel movement and passing of gas. This seems consistent with a fissure. For this reason in addition to the Anusol cream twice a day we will also prescribe rectal mitral percent cream twice a day alternated with Anusol for healing of a possible anal fissure. See rectal bleeding assessment and plan for further details of her plan today.  Return for follow-up in 6-8 weeks.

## 2014-11-10 NOTE — Progress Notes (Signed)
cc'ed to pcp °

## 2014-11-14 ENCOUNTER — Other Ambulatory Visit: Payer: Medicaid Other | Admitting: Adult Health

## 2014-11-14 ENCOUNTER — Encounter: Payer: Self-pay | Admitting: Adult Health

## 2014-11-15 LAB — CBC WITH DIFFERENTIAL/PLATELET
Basophils Absolute: 0 K/uL (ref 0.0–0.1)
Basophils Relative: 0 % (ref 0–1)
Eosinophils Absolute: 0.1 K/uL (ref 0.0–0.7)
Eosinophils Relative: 1 % (ref 0–5)
HCT: 30.9 % — ABNORMAL LOW (ref 36.0–46.0)
Hemoglobin: 9.6 g/dL — ABNORMAL LOW (ref 12.0–15.0)
Lymphocytes Relative: 27 % (ref 12–46)
Lymphs Abs: 2.4 K/uL (ref 0.7–4.0)
MCH: 22.1 pg — ABNORMAL LOW (ref 26.0–34.0)
MCHC: 31.1 g/dL (ref 30.0–36.0)
MCV: 71.2 fL — ABNORMAL LOW (ref 78.0–100.0)
MPV: 10.2 fL (ref 8.6–12.4)
Monocytes Absolute: 0.5 K/uL (ref 0.1–1.0)
Monocytes Relative: 6 % (ref 3–12)
Neutro Abs: 5.9 K/uL (ref 1.7–7.7)
Neutrophils Relative %: 66 % (ref 43–77)
Platelets: 332 K/uL (ref 150–400)
RBC: 4.34 MIL/uL (ref 3.87–5.11)
RDW: 16.7 % — ABNORMAL HIGH (ref 11.5–15.5)
WBC: 9 K/uL (ref 4.0–10.5)

## 2014-12-07 ENCOUNTER — Other Ambulatory Visit: Payer: Self-pay

## 2014-12-07 DIAGNOSIS — K625 Hemorrhage of anus and rectum: Secondary | ICD-10-CM

## 2014-12-08 ENCOUNTER — Ambulatory Visit (HOSPITAL_COMMUNITY): Admission: RE | Admit: 2014-12-08 | Payer: Medicaid Other | Source: Ambulatory Visit | Admitting: Gastroenterology

## 2014-12-08 ENCOUNTER — Encounter (HOSPITAL_COMMUNITY): Admission: RE | Payer: Self-pay | Source: Ambulatory Visit

## 2014-12-08 SURGERY — COLONOSCOPY
Anesthesia: Moderate Sedation

## 2014-12-11 ENCOUNTER — Telehealth: Payer: Self-pay | Admitting: General Practice

## 2014-12-11 NOTE — Telephone Encounter (Signed)
Noted  

## 2014-12-11 NOTE — Telephone Encounter (Signed)
[facility] from [facility] stating the patient was a "no show" for her procedure on last Friday.  They tried to call her several times and she didn't answer.

## 2014-12-11 NOTE — Telephone Encounter (Signed)
i tried to call with no answer but I did leave a message for her to call us back

## 2014-12-24 ENCOUNTER — Ambulatory Visit (HOSPITAL_BASED_OUTPATIENT_CLINIC_OR_DEPARTMENT_OTHER): Payer: Medicaid Other | Attending: Nurse Practitioner

## 2014-12-26 ENCOUNTER — Encounter: Payer: Self-pay | Admitting: Obstetrics & Gynecology

## 2014-12-26 ENCOUNTER — Ambulatory Visit (INDEPENDENT_AMBULATORY_CARE_PROVIDER_SITE_OTHER): Payer: Medicaid Other | Admitting: Obstetrics & Gynecology

## 2014-12-26 ENCOUNTER — Other Ambulatory Visit (HOSPITAL_COMMUNITY)
Admission: RE | Admit: 2014-12-26 | Discharge: 2014-12-26 | Disposition: A | Discharge status: Home / Self Care | Payer: Medicaid Other | Source: Ambulatory Visit | Attending: Obstetrics & Gynecology | Admitting: Obstetrics & Gynecology

## 2014-12-26 VITALS — BP 110/70 | HR 76 | Ht 67.0 in | Wt 310.0 lb

## 2014-12-26 DIAGNOSIS — Z01419 Encounter for gynecological examination (general) (routine) without abnormal findings: Secondary | ICD-10-CM | POA: Insufficient documentation

## 2014-12-26 DIAGNOSIS — Z0000 Encounter for general adult medical examination without abnormal findings: Secondary | ICD-10-CM

## 2014-12-26 MED ORDER — NORETHINDRONE 0.35 MG PO TABS: 1.0000 | ORAL_TABLET | Freq: Every day | ORAL | Status: DC

## 2014-12-26 NOTE — Progress Notes (Signed)
Patient ID: Sierra Heath, female   DOB: Jun 18, 1991, 24 y.o.   MRN: [id number] Subjective:     Sierra Miresllison N Delisa is a 24 y.o. female here for a routine exam.  Patient's last menstrual period was 12/08/2014. G1P1000 Birth Control Method:  norethindrone Menstrual Calendar(currently): regular  Current complaints: none.   Current acute medical issues:  none   Recent Gynecologic History Patient's last menstrual period was 12/08/2014. Last Pap: 2014,  normal Last mammogram: ,    Past Medical History  Diagnosis Date  . Migraine   . Back pain, chronic   . Acid reflux   . Sleep apnea   . History of chlamydia   . Vaginal discharge 09/11/2014  . BV (bacterial vaginosis) 09/11/2014  . Contraceptive management 09/11/2014  . Obesity   . Hemorrhoids 10/03/2014  . Rectal fissure 10/03/2014  . Diarrhea 10/11/2014  . Nausea 10/11/2014    Past Surgical History  Procedure Laterality Date  . Tonsillectomy    . Hernia repair      age 89  . Cesarean section    . Wisdom tooth extraction      OB History    Gravida Para Term Preterm AB TAB SAB Ectopic Multiple Living   1 1 1              History   Social History  . Marital Status: Single    Spouse Name: N/A  . Number of Children: N/A  . Years of Education: N/A   Social History Main Topics  . Smoking status: Never Smoker   . Smokeless tobacco: Never Used  . Alcohol Use: No  . Drug Use: No  . Sexual Activity: Yes    Birth Control/ Protection: Pill   Other Topics Concern  . None   Social History Narrative    Family History  Problem Relation Age of Onset  . Hypertension Mother   . Hyperlipidemia Mother   . Cancer Paternal Grandmother   . Diabetes Paternal Grandmother   . Colon cancer Neg Hx      Current outpatient prescriptions:  .  ALPRAZolam (XANAX) 1 MG tablet, Take 1 mg by mouth daily as needed for anxiety. , Disp: , Rfl:  .  norethindrone (MICRONOR,CAMILA,ERRIN) 0.35 MG tablet, Take 1 tablet (0.35 mg total) by mouth  daily., Disp: 1 Package, Rfl: 3 .  omeprazole (PRILOSEC) 20 MG capsule, Take 20 mg by mouth 2 (two) times daily., Disp: , Rfl:  .  hydrocortisone (ANUSOL-HC) 2.5 % rectal cream, Place 1 application rectally 2 (two) times daily. USE FOR UP TO 7-10 DAYS (Patient not taking: Reported on 12/04/2014), Disp: 30 g, Rfl: 1 .  Nitroglycerin 0.4 % OINT, Place 1 application rectally 2 (two) times daily. ALTERNATE WITH ANUSOL CREAM (Patient not taking: Reported on 12/04/2014), Disp: 1 Tube, Rfl: 0  Review of Systems  Review of Systems  Constitutional: Negative for fever, chills, weight loss, malaise/fatigue and diaphoresis.  HENT: Negative for hearing loss, ear pain, nosebleeds, congestion, sore throat, neck pain, tinnitus and ear discharge.   Eyes: Negative for blurred vision, double vision, photophobia, pain, discharge and redness.  Respiratory: Negative for cough, hemoptysis, sputum production, shortness of breath, wheezing and stridor.   Cardiovascular: Negative for chest pain, palpitations, orthopnea, claudication, leg swelling and PND.  Gastrointestinal: negative for abdominal pain. Negative for heartburn, nausea, vomiting, diarrhea, constipation, blood in stool and melena.  Genitourinary: Negative for dysuria, urgency, frequency, hematuria and flank pain.  Musculoskeletal: Negative for myalgias, back pain, joint pain  and falls.  Skin: Negative for itching and rash.  Neurological: Negative for dizziness, tingling, tremors, sensory change, speech change, focal weakness, seizures, loss of consciousness, weakness and headaches.  Endo/Heme/Allergies: Negative for environmental allergies and polydipsia. Does not bruise/bleed easily.  Psychiatric/Behavioral: Negative for depression, suicidal ideas, hallucinations, memory loss and substance abuse. The patient is not nervous/anxious and does not have insomnia.        Objective:  Blood pressure 110/70, pulse 76, height  (1.702 m), weight 310 lb (140.615  kg), last menstrual period 12/08/2014.   Physical Exam  Vitals reviewed. Constitutional: She is oriented to person, place, and time. She appears well-developed and well-nourished.  HENT:  Head: Normocephalic and atraumatic.        Right Ear: External ear normal.  Left Ear: External ear normal.  Nose: Nose normal.  Mouth/Throat: Oropharynx is clear and moist.  Eyes: Conjunctivae and EOM are normal. Pupils are equal, round, and reactive to light. Right eye exhibits no discharge. Left eye exhibits no discharge. No scleral icterus.  Neck: Normal range of motion. Neck supple. No tracheal deviation present. No thyromegaly present.  Cardiovascular: Normal rate, regular rhythm, normal heart sounds and intact distal pulses.  Exam reveals no gallop and no friction rub.   No murmur heard. Respiratory: Effort normal and breath sounds normal. No respiratory distress. She has no wheezes. She has no rales. She exhibits no tenderness.  GI: Soft. Bowel sounds are normal. She exhibits no distension and no mass. There is no tenderness. There is no rebound and no guarding.  Genitourinary:  Breasts no masses skin changes or nipple changes bilaterally      Vulva is normal without lesions Vagina is pink moist without discharge Cervix normal in appearance and pap is done Uterus is normal size shape and contour Adnexa is negative with normal sized ovaries   Musculoskeletal: Normal range of motion. She exhibits no edema and no tenderness.  Neurological: She is alert and oriented to person, place, and time. She has normal reflexes. She displays normal reflexes. No cranial nerve deficit. She exhibits normal muscle tone. Coordination normal.  Skin: Skin is warm and dry. No rash noted. No erythema. No pallor.  Psychiatric: She has a normal mood and affect. Her behavior is normal. Judgment and thought content normal.       Assessment:    Healthy female exam.    Plan:    Contraception: oral progesterone-only  contraceptive. Follow up in: 1 year.

## 2014-12-27 ENCOUNTER — Encounter: Payer: Self-pay | Admitting: Internal Medicine

## 2014-12-27 ENCOUNTER — Ambulatory Visit (HOSPITAL_BASED_OUTPATIENT_CLINIC_OR_DEPARTMENT_OTHER): Payer: Medicaid Other | Attending: Nurse Practitioner | Admitting: Radiology

## 2014-12-27 VITALS — Ht 66.0 in | Wt 310.0 lb

## 2014-12-27 DIAGNOSIS — G4731 Primary central sleep apnea: Secondary | ICD-10-CM | POA: Diagnosis not present

## 2014-12-27 DIAGNOSIS — G4733 Obstructive sleep apnea (adult) (pediatric): Secondary | ICD-10-CM | POA: Diagnosis not present

## 2014-12-27 DIAGNOSIS — R0683 Snoring: Secondary | ICD-10-CM | POA: Diagnosis not present

## 2014-12-27 DIAGNOSIS — G4710 Hypersomnia, unspecified: Secondary | ICD-10-CM | POA: Diagnosis present

## 2014-12-27 DIAGNOSIS — G4730 Sleep apnea, unspecified: Secondary | ICD-10-CM

## 2014-12-28 LAB — CYTOLOGY - PAP

## 2014-12-30 ENCOUNTER — Ambulatory Visit (HOSPITAL_BASED_OUTPATIENT_CLINIC_OR_DEPARTMENT_OTHER): Payer: Medicaid Other | Admitting: Internal Medicine

## 2014-12-30 DIAGNOSIS — G4730 Sleep apnea, unspecified: Secondary | ICD-10-CM

## 2014-12-30 NOTE — Sleep Study (Addendum)
   NAME: Sierra Heath DATE OF BIRTH:  [birth year] MEDICAL RECORD NUMBER [id number]  LOCATION: [facility]  PHYSICIAN: YOUNG,CLINTON D  DATE OF STUDY: 12/27/2014  SLEEP STUDY TYPE: Nocturnal Polysomnogram               REFERRING PHYSICIAN: Lupita Dawn, NP  INDICATION FOR STUDY: Hypersomnia with sleep apnea  EPWORTH SLEEPINESS SCORE:   10/24 HEIGHT: 5\' 6"  (167.6 cm)  WEIGHT: (!) 310 lb (140.615 kg)    Body mass index is 50.06 kg/(m^2).  NECK SIZE: 16.5 in.  MEDICATIONS: Charted for review  SLEEP ARCHITECTURE: Total sleep time 315 minutes with sleep efficiency 81.2%. Stage I was 5.4%, stage II 54.4%, stage III 15.7%, REM 24.4% of total sleep time. Sleep latency 23.5 minutes, REM latency 93 minutes, awake after sleep onset 49.5 minutes, arousal index 15.6, bedtime medication: None  RESPIRATORY DATA: Apnea hypopnea index (AHI) 19.4 per hour. 102 total events scored including 8 obstructive apneas, 50 central apneas, 1 mixed apnea, 43 hypopneas. Non-positional events. REM AHI 51.4 per hour. Most respiratory events developed late, during REM. There were not enough early events to meet protocol requirements for split CPAP titration on this study.  OXYGEN DATA: Mild snoring with oxygen desaturation to a nadir of 80% and mean saturation 96.8% on room air  CARDIAC DATA: Normal sinus rhythm  MOVEMENT/PARASOMNIA: No significant movement disturbance, bathroom 1  IMPRESSION/ RECOMMENDATION:   1) Moderate obstructive and central sleep apnea/hypopnea syndrome, AHI 19.4 per hour, with events in all sleep positions. Mild snoring with oxygen desaturation to a nadir of 80% and mean saturation 96.8% on room air. 2) There were not enough early respiratory events to meet protocol requirements for split CPAP titration. The number of events scored as central apneas is somewhat unusual for a patient this age. Consider therapeutic trial of CPAP. This patient can return for  dedicated CPAP titration study if appropriate.    Waymon Budge Diplomate, American Board of Sleep Medicine  ELECTRONICALLY SIGNED ON:  12/30/2014, 1:10 PM [facility] PH: [phone]   FX: [id number] ACCREDITED BY THE AMERICAN ACADEMY OF SLEEP MEDICINE

## 2015-01-02 ENCOUNTER — Ambulatory Visit: Payer: Medicaid Other | Admitting: Nurse Practitioner

## 2015-01-03 ENCOUNTER — Encounter: Payer: Self-pay | Admitting: *Deleted

## 2015-01-03 ENCOUNTER — Ambulatory Visit: Payer: Medicaid Other | Admitting: Nurse Practitioner

## 2015-03-05 ENCOUNTER — Encounter (HOSPITAL_BASED_OUTPATIENT_CLINIC_OR_DEPARTMENT_OTHER): Payer: Self-pay | Admitting: *Deleted

## 2015-03-06 ENCOUNTER — Ambulatory Visit (HOSPITAL_BASED_OUTPATIENT_CLINIC_OR_DEPARTMENT_OTHER): Payer: Medicaid Other | Attending: Nurse Practitioner

## 2015-03-14 ENCOUNTER — Ambulatory Visit (HOSPITAL_BASED_OUTPATIENT_CLINIC_OR_DEPARTMENT_OTHER): Payer: Medicaid Other | Attending: Nurse Practitioner | Admitting: Radiology

## 2015-03-14 NOTE — Sleep Study (Unsigned)
The patient was unable to complete the CPAP study due to a cold with symptoms of running nose, congestion and coughing. The patient will call to reschedule.

## 2015-03-22 ENCOUNTER — Encounter (HOSPITAL_BASED_OUTPATIENT_CLINIC_OR_DEPARTMENT_OTHER): Payer: Medicaid Other

## 2015-03-29 ENCOUNTER — Ambulatory Visit (HOSPITAL_BASED_OUTPATIENT_CLINIC_OR_DEPARTMENT_OTHER): Payer: Medicaid Other | Attending: Nurse Practitioner

## 2015-03-29 VITALS — Ht 66.0 in | Wt 303.0 lb

## 2015-03-29 DIAGNOSIS — G4730 Sleep apnea, unspecified: Secondary | ICD-10-CM

## 2015-03-29 DIAGNOSIS — G4733 Obstructive sleep apnea (adult) (pediatric): Secondary | ICD-10-CM | POA: Diagnosis not present

## 2015-04-01 ENCOUNTER — Encounter (HOSPITAL_BASED_OUTPATIENT_CLINIC_OR_DEPARTMENT_OTHER): Payer: Medicaid Other | Admitting: Internal Medicine

## 2015-04-01 DIAGNOSIS — G4730 Sleep apnea, unspecified: Secondary | ICD-10-CM | POA: Diagnosis not present

## 2015-04-01 NOTE — Progress Notes (Signed)
  Patient Name: Sierra Heath, Sierra Heath Date: 03/29/2015 Gender: Female D.O.B: May 19, 1991 Age (years): 24 Referring Provider: Mirna Mires Height (inches): 66 Interpreting Physician: Jetty Duhamel MD, ABSM Weight (lbs): 310 RPSGT: Cherylann Parr BMI: 50 MRN: [id number] Neck Size: 16.00 CLINICAL INFORMATION The patient is referred for a CPAP titration to treat sleep apnea.     Date of  diagnostic NPSG, Split Night or HST: 12/27/14  AHI 19.4,   body weight 310 lbs  SLEEP STUDY TECHNIQUE As per the AASM Manual for the Scoring of Sleep and Associated Events v2.3 (April 2016) with a hypopnea requiring 4% desaturations.  The channels recorded and monitored were frontal, central and occipital EEG, electrooculogram (EOG), submentalis EMG (chin), nasal and oral airflow, thoracic and abdominal wall motion, anterior tibialis EMG, snore microphone, electrocardiogram, and pulse oximetry. Continuous positive airway pressure (CPAP) was initiated at the beginning of the study and titrated to treat sleep-disordered breathing.  MEDICATIONS Medications taken by the patient : charted for review Medications administered by patient during sleep study : No sleep medicine administered.  TECHNICIAN COMMENTS Comments added by technician: Patient had difficulty initiating sleep.  Comments added by scorer: N/A  RESPIRATORY PARAMETERS Optimal PAP Pressure (cm): 15 AHI at Optimal Pressure (/hr): 4.5 Overall Minimal O2 (%): 88.00 Supine % at Optimal Pressure (%): 0 Minimal O2 at Optimal Pressure (%): 93.0    SLEEP ARCHITECTURE The study was initiated at 10:02:15 PM and ended at 4:53:02 AM.  Sleep onset time was 103.9 minutes and the sleep efficiency was 55.0%. The total sleep time was 226.0 minutes.  The patient spent 10.18% of the night in stage N1 sleep, 64.38% in stage N2 sleep, 15.49% in stage N3 and 9.96% in REM.Stage REM latency was 231.5 minutes  Wake after sleep onset was 80.9. Alpha intrusion was  absent. Supine sleep was 0.00%.  CARDIAC DATA The 2 lead EKG demonstrated sinus rhythm. The mean heart rate was 71.70 beats per minute. Other EKG findings include: None . LEG MOVEMENT DATA The total Periodic Limb Movements of Sleep (PLMS) were 3. The PLMS index was 0.80. A PLMS index of <15 is considered normal in adults.  IMPRESSIONS The optimal PAP pressure was 15 cm of water. Mild Central Sleep Apnea was noted during this titration (CAI = 11.9/h). Mild oxygen desaturations were observed during this titration (min O2 = 88.00%). No snoring was audible during this study. No cardiac abnormalities were observed during this study. Clinically significant periodic limb movements were not noted during this study. Arousals associated with PLMs were rare.  DIAGNOSIS Obstructive Sleep Apnea (327.23 [G47.33 ICD-10])  RECOMMENDATIONS Trial of CPAP therapy on 15 cm H2O with a Standard size Fisher&Paykel Nasal Pillow Mask [place] Q mask and heated humidification. Avoid alcohol, sedatives and other CNS depressants that may worsen sleep apnea and disrupt normal sleep architecture. Sleep hygiene should be reviewed to assess factors that may improve sleep quality. Weight management and regular exercise should be initiated or continued.     Waymon Budge Diplomate, American Board of Sleep Medicine  ELECTRONICALLY SIGNED ON:  04/01/2015, 10:32 AM [facility] PH: [phone]   FX: [phone] ACCREDITED BY THE AMERICAN ACADEMY OF SLEEP MEDICINE

## 2015-05-30 ENCOUNTER — Encounter (HOSPITAL_BASED_OUTPATIENT_CLINIC_OR_DEPARTMENT_OTHER): Payer: Medicaid Other

## 2015-09-04 ENCOUNTER — Ambulatory Visit: Payer: Medicaid Other | Admitting: Obstetrics & Gynecology

## 2015-12-25 ENCOUNTER — Ambulatory Visit (INDEPENDENT_AMBULATORY_CARE_PROVIDER_SITE_OTHER): Payer: Medicaid Other | Admitting: Adult Health

## 2015-12-25 ENCOUNTER — Encounter: Payer: Self-pay | Admitting: Adult Health

## 2015-12-25 VITALS — BP 130/76 | HR 83 | Ht 66.0 in | Wt 338.0 lb

## 2015-12-25 DIAGNOSIS — Z3201 Encounter for pregnancy test, result positive: Secondary | ICD-10-CM | POA: Diagnosis not present

## 2015-12-25 DIAGNOSIS — Z3490 Encounter for supervision of normal pregnancy, unspecified, unspecified trimester: Secondary | ICD-10-CM

## 2015-12-25 DIAGNOSIS — O3680X0 Pregnancy with inconclusive fetal viability, not applicable or unspecified: Secondary | ICD-10-CM

## 2015-12-25 DIAGNOSIS — N926 Irregular menstruation, unspecified: Secondary | ICD-10-CM

## 2015-12-25 DIAGNOSIS — Z98891 History of uterine scar from previous surgery: Secondary | ICD-10-CM

## 2015-12-25 HISTORY — DX: History of uterine scar from previous surgery: Z98.891

## 2015-12-25 HISTORY — DX: Encounter for supervision of normal pregnancy, unspecified, unspecified trimester: Z34.90

## 2015-12-25 LAB — POCT URINE PREGNANCY: PREG TEST UR: POSITIVE — AB

## 2015-12-25 MED ORDER — PRENATAL PLUS 27-1 MG PO TABS: 1.0000 | ORAL_TABLET | Freq: Every day | ORAL | Status: DC

## 2015-12-25 NOTE — Progress Notes (Signed)
Subjective:     Patient ID: Sierra MiresAllison N Heath, female   DOB: 04/01/1991, 25 y.o.   MRN: [id number]  HPI Sierra Standardllison is a 25 year old white female in for UPT, has missed a period,she denies any bleeding or cramping or nausea and vomiting.She did have a prior C section.  Review of Systems Patient denies any headaches, hearing loss, fatigue, blurred vision, shortness of breath, chest pain, abdominal pain, problems with bowel movements, urination, or intercourse. No joint pain or mood swings.+missed period Reviewed past medical,surgical, social and family history. Reviewed medications and allergies.     Objective:   Physical Exam BP 130/76 mmHg  Pulse 83  Ht 5\' 6"  (1.676 m)  Wt 338 lb (153.316 kg)  BMI 54.58 kg/m2  LMP 11/12/2015 UPT + about 6+1 week by LMP with EDD 08/18/16, medicaid form given, Skin warm and dry. Neck: mid line trachea, normal thyroid, good ROM, no lymphadenopathy noted. Lungs: clear to ausculation bilaterally. Cardiovascular: regular rate and rhythm.Abdomen is soft and non tender, obese.    Assessment:     UPT + Pregnant History of C section    Plan:     Rx prenatal plus #30 take 1 daily with 11 refills Return 6/15 for dating US Review handout on first trimester

## 2015-12-25 NOTE — Patient Instructions (Signed)
First Trimester of Pregnancy The first trimester of pregnancy is from week 1 until the end of week 12 (months 1 through 3). A week after a sperm fertilizes an egg, the egg will implant on the wall of the uterus. This embryo will begin to develop into a baby. Genes from you and your partner are forming the baby. The female genes determine whether the baby is a boy or a girl. At 6-8 weeks, the eyes and face are formed, and the heartbeat can be seen on ultrasound. At the end of 12 weeks, all the baby's organs are formed.  Now that you are pregnant, you will want to do everything you can to have a healthy baby. Two of the most important things are to get good prenatal care and to follow your health care provider's instructions. Prenatal care is all the medical care you receive before the baby's birth. This care will help prevent, find, and treat any problems during the pregnancy and childbirth. BODY CHANGES Your body goes through many changes during pregnancy. The changes vary from woman to woman.   You may gain or lose a couple of pounds at first.  You may feel sick to your stomach (nauseous) and throw up (vomit). If the vomiting is uncontrollable, call your health care provider.  You may tire easily.  You may develop headaches that can be relieved by medicines approved by your health care provider.  You may urinate more often. Painful urination may mean you have a bladder infection.  You may develop heartburn as a result of your pregnancy.  You may develop constipation because certain hormones are causing the muscles that push waste through your intestines to slow down.  You may develop hemorrhoids or swollen, bulging veins (varicose veins).  Your breasts may begin to grow larger and become tender. Your nipples may stick out more, and the tissue that surrounds them (areola) may become darker.  Your gums may bleed and may be sensitive to brushing and flossing.  Dark spots or blotches (chloasma,  mask of pregnancy) may develop on your face. This will likely fade after the baby is born.  Your menstrual periods will stop.  You may have a loss of appetite.  You may develop cravings for certain kinds of food.  You may have changes in your emotions from day to day, such as being excited to be pregnant or being concerned that something may go wrong with the pregnancy and baby.  You may have more vivid and strange dreams.  You may have changes in your hair. These can include thickening of your hair, rapid growth, and changes in texture. Some women also have hair loss during or after pregnancy, or hair that feels dry or thin. Your hair will most likely return to normal after your baby is born. WHAT TO EXPECT AT YOUR PRENATAL VISITS During a routine prenatal visit:  You will be weighed to make sure you and the baby are growing normally.  Your blood pressure will be taken.  Your abdomen will be measured to track your baby's growth.  The fetal heartbeat will be listened to starting around week 10 or 12 of your pregnancy.  Test results from any previous visits will be discussed. Your health care provider may ask you:  How you are feeling.  If you are feeling the baby move.  If you have had any abnormal symptoms, such as leaking fluid, bleeding, severe headaches, or abdominal cramping.  If you are using any tobacco products,   including cigarettes, chewing tobacco, and electronic cigarettes.  If you have any questions. Other tests that may be performed during your first trimester include:  Blood tests to find your blood type and to check for the presence of any previous infections. They will also be used to check for low iron levels (anemia) and Rh antibodies. Later in the pregnancy, blood tests for diabetes will be done along with other tests if problems develop.  Urine tests to check for infections, diabetes, or protein in the urine.  An ultrasound to confirm the proper growth  and development of the baby.  An amniocentesis to check for possible genetic problems.  Fetal screens for spina bifida and Down syndrome.  You may need other tests to make sure you and the baby are doing well.  HIV (human immunodeficiency virus) testing. Routine prenatal testing includes screening for HIV, unless you choose not to have this test. HOME CARE INSTRUCTIONS  Medicines  Follow your health care provider's instructions regarding medicine use. Specific medicines may be either safe or unsafe to take during pregnancy.  Take your prenatal vitamins as directed.  If you develop constipation, try taking a stool softener if your health care provider approves. Diet  Eat regular, well-balanced meals. Choose a variety of foods, such as meat or vegetable-based protein, fish, milk and low-fat dairy products, vegetables, fruits, and whole grain breads and cereals. Your health care provider will help you determine the amount of weight gain that is right for you.  Avoid raw meat and uncooked cheese. These carry germs that can cause birth defects in the baby.  Eating four or five small meals rather than three large meals a day may help relieve nausea and vomiting. If you start to feel nauseous, eating a few soda crackers can be helpful. Drinking liquids between meals instead of during meals also seems to help nausea and vomiting.  If you develop constipation, eat more high-fiber foods, such as fresh vegetables or fruit and whole grains. Drink enough fluids to keep your urine clear or pale yellow. Activity and Exercise  Exercise only as directed by your health care provider. Exercising will help you:  Control your weight.  Stay in shape.  Be prepared for labor and delivery.  Experiencing pain or cramping in the lower abdomen or low back is a good sign that you should stop exercising. Check with your health care provider before continuing normal exercises.  Try to avoid standing for long  periods of time. Move your legs often if you must stand in one place for a long time.  Avoid heavy lifting.  Wear low-heeled shoes, and practice good posture.  You may continue to have sex unless your health care provider directs you otherwise. Relief of Pain or Discomfort  Wear a good support bra for breast tenderness.   Take warm sitz baths to soothe any pain or discomfort caused by hemorrhoids. Use hemorrhoid cream if your health care provider approves.   Rest with your legs elevated if you have leg cramps or low back pain.  If you develop varicose veins in your legs, wear support hose. Elevate your feet for 15 minutes, 3-4 times a day. Limit salt in your diet. Prenatal Care  Schedule your prenatal visits by the twelfth week of pregnancy. They are usually scheduled monthly at first, then more often in the last 2 months before delivery.  Write down your questions. Take them to your prenatal visits.  Keep all your prenatal visits as directed by your   health care provider. Safety  Wear your seat belt at all times when driving.  Make a list of emergency phone numbers, including numbers for family, friends, the hospital, and police and fire departments. General Tips  Ask your health care provider for a referral to a local prenatal education class. Begin classes no later than at the beginning of month 6 of your pregnancy.  Ask for help if you have counseling or nutritional needs during pregnancy. Your health care provider can offer advice or refer you to specialists for help with various needs.  Do not use hot tubs, steam rooms, or saunas.  Do not douche or use tampons or scented sanitary pads.  Do not cross your legs for long periods of time.  Avoid cat litter boxes and soil used by cats. These carry germs that can cause birth defects in the baby and possibly loss of the fetus by miscarriage or stillbirth.  Avoid all smoking, herbs, alcohol, and medicines not prescribed by  your health care provider. Chemicals in these affect the formation and growth of the baby.  Do not use any tobacco products, including cigarettes, chewing tobacco, and electronic cigarettes. If you need help quitting, ask your health care provider. You may receive counseling support and other resources to help you quit.  Schedule a dentist appointment. At home, brush your teeth with a soft toothbrush and be gentle when you floss. SEEK MEDICAL CARE IF:   You have dizziness.  You have mild pelvic cramps, pelvic pressure, or nagging pain in the abdominal area.  You have persistent nausea, vomiting, or diarrhea.  You have a bad smelling vaginal discharge.  You have pain with urination.  You notice increased swelling in your face, hands, legs, or ankles. SEEK IMMEDIATE MEDICAL CARE IF:   You have a fever.  You are leaking fluid from your vagina.  You have spotting or bleeding from your vagina.  You have severe abdominal cramping or pain.  You have rapid weight gain or loss.  You vomit blood or material that looks like coffee grounds.  You are exposed to German measles and have never had them.  You are exposed to fifth disease or chickenpox.  You develop a severe headache.  You have shortness of breath.  You have any kind of trauma, such as from a fall or a car accident.   This information is not intended to replace advice given to you by your health care provider. Make sure you discuss any questions you have with your health care provider.   Document Released: 07/01/2001 Document Revised: 07/28/2014 Document Reviewed: 05/17/2013 Elsevier Interactive Patient Education 2016 [employer] Return in 1 week for US 

## 2016-01-03 ENCOUNTER — Ambulatory Visit (INDEPENDENT_AMBULATORY_CARE_PROVIDER_SITE_OTHER): Payer: Medicaid Other

## 2016-01-03 ENCOUNTER — Other Ambulatory Visit: Payer: Self-pay | Admitting: Adult Health

## 2016-01-03 DIAGNOSIS — Z3A01 Less than 8 weeks gestation of pregnancy: Secondary | ICD-10-CM

## 2016-01-03 DIAGNOSIS — O3680X0 Pregnancy with inconclusive fetal viability, not applicable or unspecified: Secondary | ICD-10-CM

## 2016-01-03 NOTE — Progress Notes (Signed)
US 6+3 wks,single IUP w/ys,pos fht 138 bpm,limited view of lt ov,simple corpus luteal cyst rt ov 4.1 x 3.2 x 3.6 cm,crl 6.7 mm

## 2016-01-10 ENCOUNTER — Telehealth: Payer: Self-pay | Admitting: Adult Health

## 2016-01-10 NOTE — Telephone Encounter (Signed)
Left message x 1. JSY 

## 2016-01-11 ENCOUNTER — Telehealth: Payer: Self-pay | Admitting: Adult Health

## 2016-01-11 NOTE — Telephone Encounter (Signed)
Left message x 2. JSY 

## 2016-01-11 NOTE — Telephone Encounter (Signed)
Take tylenol for headaches

## 2016-01-11 NOTE — Telephone Encounter (Signed)
Spoke with pt. Pt has migraines. Tylenol is not helping. What can she take? Thanks!! JSY

## 2016-01-17 ENCOUNTER — Other Ambulatory Visit: Payer: Self-pay | Admitting: Advanced Practice Midwife

## 2016-01-17 ENCOUNTER — Other Ambulatory Visit (INDEPENDENT_AMBULATORY_CARE_PROVIDER_SITE_OTHER): Payer: Medicaid Other

## 2016-01-17 ENCOUNTER — Ambulatory Visit (INDEPENDENT_AMBULATORY_CARE_PROVIDER_SITE_OTHER): Payer: Medicaid Other | Admitting: Advanced Practice Midwife

## 2016-01-17 ENCOUNTER — Encounter: Payer: Self-pay | Admitting: Advanced Practice Midwife

## 2016-01-17 VITALS — BP 120/80 | HR 86 | Wt 333.0 lb

## 2016-01-17 DIAGNOSIS — O99211 Obesity complicating pregnancy, first trimester: Secondary | ICD-10-CM

## 2016-01-17 DIAGNOSIS — Z3A09 9 weeks gestation of pregnancy: Secondary | ICD-10-CM | POA: Diagnosis not present

## 2016-01-17 DIAGNOSIS — Z3682 Encounter for antenatal screening for nuchal translucency: Secondary | ICD-10-CM

## 2016-01-17 DIAGNOSIS — Z3491 Encounter for supervision of normal pregnancy, unspecified, first trimester: Secondary | ICD-10-CM

## 2016-01-17 DIAGNOSIS — Z1389 Encounter for screening for other disorder: Secondary | ICD-10-CM

## 2016-01-17 DIAGNOSIS — Z3481 Encounter for supervision of other normal pregnancy, first trimester: Secondary | ICD-10-CM

## 2016-01-17 DIAGNOSIS — O3680X0 Pregnancy with inconclusive fetal viability, not applicable or unspecified: Secondary | ICD-10-CM

## 2016-01-17 DIAGNOSIS — Z6841 Body Mass Index (BMI) 40.0 and over, adult: Secondary | ICD-10-CM

## 2016-01-17 DIAGNOSIS — Z331 Pregnant state, incidental: Secondary | ICD-10-CM

## 2016-01-17 DIAGNOSIS — Z369 Encounter for antenatal screening, unspecified: Secondary | ICD-10-CM

## 2016-01-17 DIAGNOSIS — Z3490 Encounter for supervision of normal pregnancy, unspecified, unspecified trimester: Secondary | ICD-10-CM | POA: Insufficient documentation

## 2016-01-17 LAB — POCT URINALYSIS DIPSTICK
GLUCOSE UA: NEGATIVE
Ketones, UA: NEGATIVE
Leukocytes, UA: NEGATIVE
NITRITE UA: NEGATIVE
Protein, UA: NEGATIVE
RBC UA: NEGATIVE

## 2016-01-17 NOTE — Progress Notes (Signed)
US 8+3 wks,single IUP, pos FHT 176 bpm,rt corpus luteal cyst 2.9 x 2.9 x 2.5 cm,unable to see lt ov,lt adnexa wnl

## 2016-01-17 NOTE — Patient Instructions (Addendum)
 First Trimester of Pregnancy The first trimester of pregnancy is from week 1 until the end of week 12 (months 1 through 3). A week after a sperm fertilizes an egg, the egg will implant on the wall of the uterus. This embryo will begin to develop into a baby. Genes from you and your partner are forming the baby. The female genes determine whether the baby is a boy or a girl. At 6-8 weeks, the eyes and face are formed, and the heartbeat can be seen on ultrasound. At the end of 12 weeks, all the baby's organs are formed.  Now that you are pregnant, you will want to do everything you can to have a healthy baby. Two of the most important things are to get good prenatal care and to follow your health care provider's instructions. Prenatal care is all the medical care you receive before the baby's birth. This care will help prevent, find, and treat any problems during the pregnancy and childbirth. BODY CHANGES Your body goes through many changes during pregnancy. The changes vary from woman to woman.   You may gain or lose a couple of pounds at first.  You may feel sick to your stomach (nauseous) and throw up (vomit). If the vomiting is uncontrollable, call your health care provider.  You may tire easily.  You may develop headaches that can be relieved by medicines approved by your health care provider.  You may urinate more often. Painful urination may mean you have a bladder infection.  You may develop heartburn as a result of your pregnancy.  You may develop constipation because certain hormones are causing the muscles that push waste through your intestines to slow down.  You may develop hemorrhoids or swollen, bulging veins (varicose veins).  Your breasts may begin to grow larger and become tender. Your nipples may stick out more, and the tissue that surrounds them (areola) may become darker.  Your gums may bleed and may be sensitive to brushing and flossing.  Dark spots or blotches  (chloasma, mask of pregnancy) may develop on your face. This will likely fade after the baby is born.  Your menstrual periods will stop.  You may have a loss of appetite.  You may develop cravings for certain kinds of food.  You may have changes in your emotions from day to day, such as being excited to be pregnant or being concerned that something may go wrong with the pregnancy and baby.  You may have more vivid and strange dreams.  You may have changes in your hair. These can include thickening of your hair, rapid growth, and changes in texture. Some women also have hair loss during or after pregnancy, or hair that feels dry or thin. Your hair will most likely return to normal after your baby is born. WHAT TO EXPECT AT YOUR PRENATAL VISITS During a routine prenatal visit:  You will be weighed to make sure you and the baby are growing normally.  Your blood pressure will be taken.  Your abdomen will be measured to track your baby's growth.  The fetal heartbeat will be listened to starting around week 10 or 12 of your pregnancy.  Test results from any previous visits will be discussed. Your health care provider may ask you:  How you are feeling.  If you are feeling the baby move.  If you have had any abnormal symptoms, such as leaking fluid, bleeding, severe headaches, or abdominal cramping.  If you have any questions. Other   tests that may be performed during your first trimester include:  Blood tests to find your blood type and to check for the presence of any previous infections. They will also be used to check for low iron levels (anemia) and Rh antibodies. Later in the pregnancy, blood tests for diabetes will be done along with other tests if problems develop.  Urine tests to check for infections, diabetes, or protein in the urine.  An ultrasound to confirm the proper growth and development of the baby.  An amniocentesis to check for possible genetic problems.  Fetal  screens for spina bifida and Down syndrome.  You may need other tests to make sure you and the baby are doing well. HOME CARE INSTRUCTIONS  Medicines  Follow your health care provider's instructions regarding medicine use. Specific medicines may be either safe or unsafe to take during pregnancy.  Take your prenatal vitamins as directed.  If you develop constipation, try taking a stool softener if your health care provider approves. Diet  Eat regular, well-balanced meals. Choose a variety of foods, such as meat or vegetable-based protein, fish, milk and low-fat dairy products, vegetables, fruits, and whole grain breads and cereals.  Avoid too many carbohydrates (sugar, drinks with sugar such as juice, white foods). Your health care provider will help you determine the amount of weight gain that is right for you. YOU SHOULD NOT GAIN ANY WEIGHT DURING THIS PREGNANCY.  WITH IMPROVED EATING HABITS, HOPEFULLY YOU WILL LOSE WEIGHT.   Avoid raw meat and uncooked cheese. These carry germs that can cause birth defects in the baby.  Eating four or five small meals rather than three large meals a day may help relieve nausea and vomiting. If you start to feel nauseous, eating a few soda crackers can be helpful. Drinking liquids between meals instead of during meals also seems to help nausea and vomiting.  If you develop constipation, eat more high-fiber foods, such as fresh vegetables or fruit and whole grains. Drink enough fluids to keep your urine clear or pale yellow. Activity and Exercise  Exercise only as directed by your health care provider. Exercising will help you:  Control your weight.  Stay in shape.  Be prepared for labor and delivery.  Experiencing pain or cramping in the lower abdomen or low back is a good sign that you should stop exercising. Check with your health care provider before continuing normal exercises.  Try to avoid standing for long periods of time. Move your legs often  if you must stand in one place for a long time.  Avoid heavy lifting.  Wear low-heeled shoes, and practice good posture.  You may continue to have sex unless your health care provider directs you otherwise. Relief of Pain or Discomfort  Wear a good support bra for breast tenderness.   Take warm sitz baths to soothe any pain or discomfort caused by hemorrhoids. Use hemorrhoid cream if your health care provider approves.   Rest with your legs elevated if you have leg cramps or low back pain.  If you develop varicose veins in your legs, wear support hose. Elevate your feet for 15 minutes, 3-4 times a day. Limit salt in your diet. Prenatal Care  Schedule your prenatal visits by the twelfth week of pregnancy. They are usually scheduled monthly at first, then more often in the last 2 months before delivery.  Write down your questions. Take them to your prenatal visits.  Keep all your prenatal visits as directed by your health  care provider. Safety  Wear your seat belt at all times when driving.  Make a list of emergency phone numbers, including numbers for family, friends, the hospital, and police and fire departments. General Tips  Ask your health care provider for a referral to a local prenatal education class. Begin classes no later than at the beginning of month 6 of your pregnancy.  Ask for help if you have counseling or nutritional needs during pregnancy. Your health care provider can offer advice or refer you to specialists for help with various needs.  Do not use hot tubs, steam rooms, or saunas.  Do not douche or use tampons or scented sanitary pads.  Do not cross your legs for long periods of time.  Avoid cat litter boxes and soil used by cats. These carry germs that can cause birth defects in the baby and possibly loss of the fetus by miscarriage or stillbirth.  Avoid all smoking, herbs, alcohol, and medicines not prescribed by your health care provider. Chemicals in  these affect the formation and growth of the baby.  Schedule a dentist appointment. At home, brush your teeth with a soft toothbrush and be gentle when you floss. SEEK MEDICAL CARE IF:   You have dizziness.  You have mild pelvic cramps, pelvic pressure, or nagging pain in the abdominal area.  You have persistent nausea, vomiting, or diarrhea.  You have a bad smelling vaginal discharge.  You have pain with urination.  You notice increased swelling in your face, hands, legs, or ankles. SEEK IMMEDIATE MEDICAL CARE IF:   You have a fever.  You are leaking fluid from your vagina.  You have spotting or bleeding from your vagina.  You have severe abdominal cramping or pain.  You have rapid weight gain or loss.  You vomit blood or material that looks like coffee grounds.  You are exposed to [place] measles and have never had them.  You are exposed to fifth disease or chickenpox.  You develop a severe headache.  You have shortness of breath.  You have any kind of trauma, such as from a fall or a car accident. Document Released: 07/01/2001 Document Revised: 11/21/2013 Document Reviewed: 05/17/2013 [facility] Patient Information 2015 [place]. This information is not intended to replace advice given to you by your health care provider. Make sure you discuss any questions you have with your health care provider.   Nausea & Vomiting  Have saltine crackers or pretzels by your bed and eat a few bites before you raise your head out of bed in the morning  Eat small frequent meals throughout the day instead of large meals  Drink plenty of fluids throughout the day to stay hydrated, just don't drink a lot of fluids with your meals.  This can make your stomach fill up faster making you feel sick  Do not brush your teeth right after you eat  Products with real ginger are good for nausea, like ginger ale and ginger hard candy Make sure it says made with real ginger!  Sucking on  sour candy like lemon heads is also good for nausea  If your prenatal vitamins make you nauseated, take them at night so you will sleep through the nausea  Sea Bands  If you feel like you need medicine for the nausea & vomiting please let us know  If you are unable to keep any fluids or food down please let us know   Constipation  Drink plenty of fluid, preferably water, throughout  the day  Eat foods high in fiber such as fruits, vegetables, and grains  Exercise, such as walking, is a good way to keep your bowels regular  Drink warm fluids, especially warm prune juice, or decaf coffee  Eat a 1/2 cup of real oatmeal (not instant), 1/2 cup applesauce, and 1/2-1 cup warm prune juice every day  If needed, you may take Colace (docusate sodium) stool softener once or twice a day to help keep the stool soft. If you are pregnant, wait until you are out of your first trimester (12-14 weeks of pregnancy)  If you still are having problems with constipation, you may take Miralax once daily as needed to help keep your bowels regular.  If you are pregnant, wait until you are out of your first trimester (12-14 weeks of pregnancy)  Safe Medications in Pregnancy   Acne: Benzoyl Peroxide Salicylic Acid  Backache/Headache: Tylenol: 2 regular strength every 4 hours OR              2 Extra strength every 6 hours  Colds/Coughs/Allergies: Benadryl (alcohol free) 25 mg every 6 hours as needed Breath right strips Claritin Cepacol throat lozenges Chloraseptic throat spray Cold-Eeze- up to three times per day Cough drops, alcohol free Flonase (by prescription only) Guaifenesin Mucinex Robitussin DM (plain only, alcohol free) Saline nasal spray/drops Sudafed (pseudoephedrine) & Actifed ** use only after [redacted] weeks gestation and if you do not have high blood pressure Tylenol Vicks Vaporub Zinc lozenges Zyrtec   Constipation: Colace Ducolax suppositories Fleet enema Glycerin  suppositories Metamucil Milk of magnesia Miralax Senokot Smooth move tea  Diarrhea: Kaopectate Imodium A-D  *NO pepto Bismol  Hemorrhoids: Anusol Anusol HC Preparation H Tucks  Indigestion: Tums Maalox Mylanta Zantac  Pepcid  Insomnia: Benadryl (alcohol free) 25mg  every 6 hours as needed Tylenol PM Unisom, no Gelcaps  Leg Cramps: Tums MagGel  Nausea/Vomiting:  Bonine Dramamine Emetrol Ginger extract Sea bands Meclizine  Nausea medication to take during pregnancy:  Unisom (doxylamine succinate 25 mg tablets) Take one tablet daily at bedtime. If symptoms are not adequately controlled, the dose can be increased to a maximum recommended dose of two tablets daily (1/2 tablet in the morning, 1/2 tablet mid-afternoon and one at bedtime). Vitamin B6 100mg  tablets. Take one tablet twice a day (up to 200 mg per day).  Skin Rashes: Aveeno products Benadryl cream or 25mg  every 6 hours as needed Calamine Lotion 1% cortisone cream  Yeast infection: Gyne-lotrimin 7 Monistat 7   **If taking multiple medications, please check labels to avoid duplicating the same active ingredients **take medication as directed on the label ** Do not exceed 4000 mg of tylenol in 24 hours **Do not take medications that contain aspirin or ibuprofen

## 2016-01-17 NOTE — Progress Notes (Signed)
Pt denies any problems or concerns at this time.  

## 2016-01-17 NOTE — Progress Notes (Signed)
Subjective:    Sierra Heath is a G2P1000 6539w3d being seen today for her first obstetrical visit.  Her obstetrical history is significant for morbid obestity..  Pregnancy history fully reviewed. She had aPLTCS for failed IOL (after PROM)/chorio.  May want TOLAC, not sure.    Patient reports no complaints.  Filed Vitals:   01/17/16 1050  BP: 120/80  Pulse: 86  Weight: 333 lb (151.048 kg)    HISTORY: OB History  Gravida Para Term Preterm AB SAB TAB Ectopic Multiple Living  2 1 1            # Outcome Date GA Lbr Len/2nd Weight Sex Delivery Anes PTL Lv  2 Current           1 Term 09/24/10 5712w6d  8 lb 5 oz (3.771 kg) F CS-LTranv EPI       Past Medical History  Diagnosis Date  . Migraine   . Back pain, chronic   . Acid reflux   . Sleep apnea   . History of chlamydia   . Vaginal discharge 09/11/2014  . BV (bacterial vaginosis) 09/11/2014  . Contraceptive management 09/11/2014  . Obesity   . Hemorrhoids 10/03/2014  . Rectal fissure 10/03/2014  . Diarrhea 10/11/2014  . Nausea 10/11/2014  . Pregnant 12/25/2015  . History of cesarean section 12/25/2015   Past Surgical History  Procedure Laterality Date  . Tonsillectomy    . Hernia repair      age 89  . Cesarean section    . Wisdom tooth extraction     Family History  Problem Relation Age of Onset  . Hypertension Mother   . Hyperlipidemia Mother   . Cancer Paternal Grandmother   . Diabetes Paternal Grandmother   . Colon cancer Neg Hx      Exam       Pelvic Exam:    Perineum: Normal Perineum   Vulva: normal   Vagina:  normal mucosa, normal discharge, no palpable nodules   Uterus Normal, Gravid, FH: 8     Cervix: normal   Adnexa: Not palpable   Urinary:  urethral meatus normal    System:     Skin: normal coloration and turgor, no rashes    Neurologic: oriented, normal, normal mood   Extremities: normal strength, tone, and muscle mass   HEENT PERRLA   Mouth/Teeth mucous membranes moist, normal dentition   Neck  supple and no masses   Cardiovascular: regular rate and rhythm   Respiratory:  appears well, vitals normal, no respiratory distress, acyanotic   Abdomen: soft, non-tender;  FHR: 176 us          Assessment:    Pregnancy: G2P1000 Patient Active Problem List   Diagnosis Date Noted  . Supervision of normal pregnancy 01/17/2016  . Pregnant 12/25/2015  . History of cesarean section 12/25/2015  . Rectal bleeding 11/09/2014  . Diarrhea 10/11/2014  . Nausea 10/11/2014  . Hemorrhoids 10/03/2014  . Rectal fissure 10/03/2014  . Vaginal discharge 09/11/2014  . BV (bacterial vaginosis) 09/11/2014  . Contraceptive management 09/11/2014        Plan:     Initial labs drawn. Continue prenatal vitamins  Problem list reviewed and updated  Reviewed n/v relief measures and warning s/s to report  Reviewed recommended weight gain based on pre-gravid BMI  Encouraged well-balanced diet Genetic Screening discussed Integrated Screen: requested.  Ultrasound discussed; fetal survey: requested.  Return in about 4 weeks (around 02/14/2016) for LROB, US:NT+1st IT.  CRESENZO-DISHMAN,Mahitha Hickling 01/17/2016

## 2016-01-19 LAB — GC/CHLAMYDIA PROBE AMP
CHLAMYDIA, DNA PROBE: NEGATIVE
NEISSERIA GONORRHOEAE BY PCR: NEGATIVE

## 2016-01-19 LAB — URINE CULTURE: ORGANISM ID, BACTERIA: NO GROWTH

## 2016-01-28 ENCOUNTER — Telehealth: Payer: Self-pay | Admitting: Women's Health

## 2016-01-28 LAB — URINALYSIS, ROUTINE W REFLEX MICROSCOPIC
BILIRUBIN UA: NEGATIVE
GLUCOSE, UA: NEGATIVE
KETONES UA: NEGATIVE
LEUKOCYTES UA: NEGATIVE
Nitrite, UA: NEGATIVE
PROTEIN UA: NEGATIVE
RBC UA: NEGATIVE
SPEC GRAV UA: 1.02 (ref 1.005–1.030)
Urobilinogen, Ur: 1 mg/dL (ref 0.2–1.0)
pH, UA: 7.5 (ref 5.0–7.5)

## 2016-01-28 LAB — PMP SCREEN PROFILE (10S), URINE
Amphetamine Screen, Ur: NEGATIVE ng/mL
BENZODIAZEPINE SCREEN, URINE: NEGATIVE ng/mL
Barbiturate Screen, Ur: NEGATIVE ng/mL
CREATININE(CRT), U: 126.6 mg/dL (ref 20.0–300.0)
Cannabinoids Ur Ql Scn: NEGATIVE ng/mL
Cocaine(Metab.)Screen, Urine: NEGATIVE ng/mL
METHADONE SCREEN, URINE: NEGATIVE ng/mL
OPIATE SCRN UR: NEGATIVE ng/mL
OXYCODONE+OXYMORPHONE UR QL SCN: NEGATIVE ng/mL
PCP Scrn, Ur: NEGATIVE ng/mL
PH UR, DRUG SCRN: 7.6 (ref 4.5–8.9)
PROPOXYPHENE SCREEN: NEGATIVE ng/mL

## 2016-01-28 LAB — CBC
HEMATOCRIT: 36 % (ref 34.0–46.6)
Hemoglobin: 11 g/dL — ABNORMAL LOW (ref 11.1–15.9)
MCH: 23.2 pg — ABNORMAL LOW (ref 26.6–33.0)
MCHC: 30.6 g/dL — AB (ref 31.5–35.7)
MCV: 76 fL — AB (ref 79–97)
PLATELETS: 296 10*3/uL (ref 150–379)
RBC: 4.75 x10E6/uL (ref 3.77–5.28)
RDW: 17.4 % — AB (ref 12.3–15.4)
WBC: 8 10*3/uL (ref 3.4–10.8)

## 2016-01-28 LAB — HEPATITIS B SURFACE ANTIGEN: Hepatitis B Surface Ag: NEGATIVE

## 2016-01-28 LAB — ABO/RH: Rh Factor: POSITIVE

## 2016-01-28 LAB — VARICELLA ZOSTER ANTIBODY, IGG: Varicella zoster IgG: 652 index (ref >165)

## 2016-01-28 LAB — CYSTIC FIBROSIS MUTATION 97: GENE DIS ANAL CARRIER INTERP BLD/T-IMP: NOT DETECTED

## 2016-01-28 LAB — RUBELLA SCREEN

## 2016-01-28 LAB — RPR: RPR Ser Ql: NONREACTIVE

## 2016-01-28 LAB — HIV ANTIBODY (ROUTINE TESTING W REFLEX): HIV SCREEN 4TH GENERATION: NONREACTIVE

## 2016-01-28 LAB — ANTIBODY SCREEN: Antibody Screen: NEGATIVE

## 2016-01-29 ENCOUNTER — Telehealth: Payer: Self-pay | Admitting: *Deleted

## 2016-01-29 NOTE — Telephone Encounter (Signed)
Pt to f/u with [facility], [place], RX for prenatal vitamins e-scribed on 12/25/2015 with 11 refills.

## 2016-01-29 NOTE — Telephone Encounter (Signed)
Left message letting pt know [facility] [place] JSY

## 2016-02-14 ENCOUNTER — Telehealth: Payer: Self-pay | Admitting: *Deleted

## 2016-02-14 ENCOUNTER — Telehealth: Payer: Self-pay | Admitting: Advanced Practice Midwife

## 2016-02-14 NOTE — Telephone Encounter (Signed)
Pt informed per Cathie Beams, CNM can take Tylenol extra strength for migraines. Pt verbalized understanding.

## 2016-02-14 NOTE — Telephone Encounter (Signed)
Duplicate message. 

## 2016-02-14 NOTE — Telephone Encounter (Signed)
Pt c/o migraines. Pt states she had migraines prior to pregnancy but the migraines are more frequent. Went to [facility] last Tuesday for Migraines was given Percocet but did not get Rx filled "throwed RX in trash."  Pt states she has an appt next Tuesday for ultrasound, requesting Rx for the Migraines now. Please advise.

## 2016-02-19 ENCOUNTER — Ambulatory Visit (INDEPENDENT_AMBULATORY_CARE_PROVIDER_SITE_OTHER): Payer: Medicaid Other | Admitting: Obstetrics & Gynecology

## 2016-02-19 ENCOUNTER — Encounter: Payer: Self-pay | Admitting: Obstetrics & Gynecology

## 2016-02-19 ENCOUNTER — Ambulatory Visit (INDEPENDENT_AMBULATORY_CARE_PROVIDER_SITE_OTHER): Payer: Medicaid Other

## 2016-02-19 VITALS — BP 110/72 | HR 80 | Wt 333.0 lb

## 2016-02-19 DIAGNOSIS — Z369 Encounter for antenatal screening, unspecified: Secondary | ICD-10-CM

## 2016-02-19 DIAGNOSIS — Z3A13 13 weeks gestation of pregnancy: Secondary | ICD-10-CM

## 2016-02-19 DIAGNOSIS — Z1389 Encounter for screening for other disorder: Secondary | ICD-10-CM

## 2016-02-19 DIAGNOSIS — Z36 Encounter for antenatal screening of mother: Secondary | ICD-10-CM

## 2016-02-19 DIAGNOSIS — Z6841 Body Mass Index (BMI) 40.0 and over, adult: Secondary | ICD-10-CM

## 2016-02-19 DIAGNOSIS — O99211 Obesity complicating pregnancy, first trimester: Secondary | ICD-10-CM

## 2016-02-19 DIAGNOSIS — Z331 Pregnant state, incidental: Secondary | ICD-10-CM

## 2016-02-19 DIAGNOSIS — Z3492 Encounter for supervision of normal pregnancy, unspecified, second trimester: Secondary | ICD-10-CM

## 2016-02-19 DIAGNOSIS — Z3682 Encounter for antenatal screening for nuchal translucency: Secondary | ICD-10-CM

## 2016-02-19 DIAGNOSIS — Z3491 Encounter for supervision of normal pregnancy, unspecified, first trimester: Secondary | ICD-10-CM

## 2016-02-19 LAB — POCT URINALYSIS DIPSTICK
Blood, UA: NEGATIVE
GLUCOSE UA: NEGATIVE
KETONES UA: NEGATIVE
LEUKOCYTES UA: NEGATIVE
NITRITE UA: NEGATIVE

## 2016-02-19 NOTE — Progress Notes (Signed)
G2P1000 [redacted]w[redacted]d Estimated Date of Delivery: 08/25/16  Blood pressure 110/72, pulse 80, weight (!) 333 lb (151 kg), last menstrual period 11/12/2015.   BP weight and urine results all reviewed and noted.  Please refer to the obstetrical flow sheet for the fundal height and fetal heart rate documentation:  Patient reports good fetal movement, denies any bleeding and no rupture of membranes symptoms or regular contractions. Patient is without complaints. All questions were answered.  Orders Placed This Encounter  Procedures  . Maternal Screen, Integrated #1  . POCT urinalysis dipstick    Plan:  Continued routine obstetrical care, NT is normal, 1st IT done  No Follow-up on file.

## 2016-02-19 NOTE — Progress Notes (Signed)
Pt states that she has questions about headaches and medications. Pt states that she would like to discuss a date for her c-section.

## 2016-02-21 LAB — MATERNAL SCREEN, INTEGRATED #1
CROWN RUMP LENGTH MAT SCREEN: 75.1 mm
Gest. Age on Collection Date: 13.1 weeks
MATERNAL AGE AT EDD: 25.6 a
NUCHAL TRANSLUCENCY (NT): 1.7 mm
NUMBER OF FETUSES: 1
PAPP-A VALUE: 1219.4 ng/mL
PDF: 0
WEIGHT: 333 [lb_av]

## 2016-03-18 ENCOUNTER — Ambulatory Visit (INDEPENDENT_AMBULATORY_CARE_PROVIDER_SITE_OTHER): Payer: Medicaid Other | Admitting: Obstetrics & Gynecology

## 2016-03-18 ENCOUNTER — Encounter: Payer: Self-pay | Admitting: Obstetrics & Gynecology

## 2016-03-18 VITALS — BP 130/70 | HR 78 | Wt 337.4 lb

## 2016-03-18 DIAGNOSIS — Z3492 Encounter for supervision of normal pregnancy, unspecified, second trimester: Secondary | ICD-10-CM

## 2016-03-18 DIAGNOSIS — Z6841 Body Mass Index (BMI) 40.0 and over, adult: Secondary | ICD-10-CM

## 2016-03-18 DIAGNOSIS — Z1389 Encounter for screening for other disorder: Secondary | ICD-10-CM

## 2016-03-18 DIAGNOSIS — Z3482 Encounter for supervision of other normal pregnancy, second trimester: Secondary | ICD-10-CM

## 2016-03-18 DIAGNOSIS — Z331 Pregnant state, incidental: Secondary | ICD-10-CM

## 2016-03-18 DIAGNOSIS — O99212 Obesity complicating pregnancy, second trimester: Secondary | ICD-10-CM

## 2016-03-18 DIAGNOSIS — Z3A18 18 weeks gestation of pregnancy: Secondary | ICD-10-CM

## 2016-03-18 LAB — POCT URINALYSIS DIPSTICK
Glucose, UA: NEGATIVE
KETONES UA: NEGATIVE
Leukocytes, UA: NEGATIVE
Nitrite, UA: NEGATIVE
PROTEIN UA: 1
RBC UA: NEGATIVE

## 2016-03-18 NOTE — Progress Notes (Signed)
G2P1000 4814w1d Estimated Date of Delivery: 08/25/16  Blood pressure 130/70, pulse 78, weight (!) 337 lb 6.4 oz (153 kg), last menstrual period 11/12/2015.   BP weight and urine results all reviewed and noted.  Please refer to the obstetrical flow sheet for the fundal height and fetal heart rate documentation:  Patient reports good fetal movement, denies any bleeding and no rupture of membranes symptoms or regular contractions. Patient is without complaints. All questions were answered.  Orders Placed This Encounter  Procedures  . US OB Comp + 14 Wk  . POCT urinalysis dipstick    Plan:  Continued routine obstetrical care, 2nd IT today   Return in about 3 weeks (around 04/08/2016) for 20 week sono, LROB.

## 2016-03-20 LAB — MATERNAL SCREEN, INTEGRATED #2
AFP MoM: 1.51
Alpha-Fetoprotein: 24.7 ng/mL
CROWN RUMP LENGTH: 75.1 mm
DIA MOM: 1.08
DIA VALUE: 123.1 pg/mL
Estriol, Unconjugated: 0.67 ng/mL
Gest. Age on Collection Date: 13.1 weeks
Gestational Age: 17.1 weeks
HCG VALUE: 41.5 [IU]/mL
MATERNAL AGE AT EDD: 25.6 a
NUCHAL TRANSLUCENCY (NT): 1.7 mm
Nuchal Translucency MoM: 0.92
Number of Fetuses: 1
PAPP-A MOM: 2.89
PAPP-A VALUE: 1219.4 ng/mL
PDF: 0
Test Results:: NEGATIVE
Weight: 333 [lb_av]
Weight: 333 [lb_av]
hCG MoM: 3.01
uE3 MoM: 0.87

## 2016-04-01 ENCOUNTER — Encounter: Payer: Medicaid Other | Admitting: Obstetrics & Gynecology

## 2016-04-08 ENCOUNTER — Other Ambulatory Visit: Payer: Self-pay | Admitting: Obstetrics & Gynecology

## 2016-04-08 DIAGNOSIS — Z1389 Encounter for screening for other disorder: Secondary | ICD-10-CM

## 2016-04-09 ENCOUNTER — Encounter: Payer: Self-pay | Admitting: Women's Health

## 2016-04-09 ENCOUNTER — Ambulatory Visit (INDEPENDENT_AMBULATORY_CARE_PROVIDER_SITE_OTHER): Payer: Medicaid Other

## 2016-04-09 ENCOUNTER — Ambulatory Visit (INDEPENDENT_AMBULATORY_CARE_PROVIDER_SITE_OTHER): Payer: Medicaid Other | Admitting: Women's Health

## 2016-04-09 VITALS — BP 132/70 | HR 76 | Wt 339.6 lb

## 2016-04-09 DIAGNOSIS — O34211 Maternal care for low transverse scar from previous cesarean delivery: Secondary | ICD-10-CM

## 2016-04-09 DIAGNOSIS — Z3482 Encounter for supervision of other normal pregnancy, second trimester: Secondary | ICD-10-CM

## 2016-04-09 DIAGNOSIS — Z1389 Encounter for screening for other disorder: Secondary | ICD-10-CM

## 2016-04-09 DIAGNOSIS — Z3A21 21 weeks gestation of pregnancy: Secondary | ICD-10-CM

## 2016-04-09 DIAGNOSIS — Z3492 Encounter for supervision of normal pregnancy, unspecified, second trimester: Secondary | ICD-10-CM

## 2016-04-09 DIAGNOSIS — Z331 Pregnant state, incidental: Secondary | ICD-10-CM | POA: Diagnosis not present

## 2016-04-09 DIAGNOSIS — Z23 Encounter for immunization: Secondary | ICD-10-CM | POA: Diagnosis not present

## 2016-04-09 DIAGNOSIS — Z36 Encounter for antenatal screening of mother: Secondary | ICD-10-CM | POA: Diagnosis not present

## 2016-04-09 DIAGNOSIS — Z98891 History of uterine scar from previous surgery: Secondary | ICD-10-CM

## 2016-04-09 LAB — POCT URINALYSIS DIPSTICK
Blood, UA: NEGATIVE
Glucose, UA: NEGATIVE
KETONES UA: NEGATIVE
NITRITE UA: NEGATIVE
Protein, UA: NEGATIVE

## 2016-04-09 NOTE — Progress Notes (Signed)
Low-risk OB appointment G2P1001 2240w2d Estimated Date of Delivery: 08/25/16 BP 132/70   Pulse 76   Wt (!) 339 lb 9.6 oz (154 kg)   LMP 11/12/2015   BMI 54.81 kg/m   BP, weight, and urine reviewed.  Refer to obstetrical flow sheet for FH & FHR.  Reports good fm.  Denies regular uc's, lof, vb, or uti s/s. Burning pain above umbilicus at times- doesn't feel like heartburn, possibly diastis recti. Offered TOLAC, interested, gave consent to take home and review Reviewed today's anatomy u/s, limited views- will repeat @ 28wks. Discussed warning s/s to report. Plan:  Continue routine obstetrical care  F/U in 4wks for OB appointment  Flu shot today

## 2016-04-09 NOTE — Progress Notes (Signed)
US 20+2 wks,cephalic,post pl gr 0,normal ov's bilat,cx 4.2 cm,svp of fluid 4.7 cm,efw 364 g,fhr 170 bpm,limited ultrasound because of pt body habitus,please have pt come back for additional images of the heart and diaphragm.

## 2016-04-09 NOTE — Patient Instructions (Signed)

## 2016-04-14 ENCOUNTER — Inpatient Hospital Stay (HOSPITAL_COMMUNITY)
Admission: AD | Admit: 2016-04-14 | Discharge: 2016-04-14 | Disposition: A | Discharge status: Home / Self Care | Payer: Medicaid Other | Source: Ambulatory Visit | Attending: Family Medicine | Admitting: Family Medicine

## 2016-04-14 DIAGNOSIS — Z3A21 21 weeks gestation of pregnancy: Secondary | ICD-10-CM | POA: Insufficient documentation

## 2016-04-14 DIAGNOSIS — O1202 Gestational edema, second trimester: Secondary | ICD-10-CM | POA: Insufficient documentation

## 2016-04-14 NOTE — MAU Note (Signed)
Swelling in feet. Drenda FreezeFran C-Dishmon, CNM seeing patient in triage.

## 2016-04-14 NOTE — MAU Provider Note (Signed)
None     Chief Complaint:  Feet swelling   Sierra Heath is  25 y.o. G2P1001 at 7729w0d presents complaining of Feet swelling .She says that she called the office and the receptionist relayed the message that she should come to the MAU.  Denies HA, visual changes, RUQ pain.  Has been walking all day and it was 2785 F today.  Obstetrical/Gynecological History: OB History    Gravida Para Term Preterm AB Living   2 1 1     1    SAB TAB Ectopic Multiple Live Births           1     Past Medical History: Past Medical History:  Diagnosis Date  . Acid reflux   . Back pain, chronic   . BV (bacterial vaginosis) 09/11/2014  . Contraceptive management 09/11/2014  . Diarrhea 10/11/2014  . Hemorrhoids 10/03/2014  . History of cesarean section 12/25/2015  . History of chlamydia   . Migraine   . Nausea 10/11/2014  . Obesity   . Pregnant 12/25/2015  . Rectal fissure 10/03/2014  . Sleep apnea   . Vaginal discharge 09/11/2014    Past Surgical History: Past Surgical History:  Procedure Laterality Date  . CESAREAN SECTION    . HERNIA REPAIR     age 89  . TONSILLECTOMY    . WISDOM TOOTH EXTRACTION      Family History: Family History  Problem Relation Age of Onset  . Hypertension Mother   . Hyperlipidemia Mother   . Cancer Paternal Grandmother   . Diabetes Paternal Grandmother   . Colon cancer Neg Hx     Social History: Social History  Substance Use Topics  . Smoking status: Never Smoker  . Smokeless tobacco: Never Used  . Alcohol use No    Allergies:  Allergies  Allergen Reactions  . Hydrocodone Hives    Meds:  No prescriptions prior to admission.    Review of Systems   Constitutional: Negative for fever and chills Eyes: Negative for visual disturbances Respiratory: Negative for shortness of breath, dyspnea Cardiovascular: Negative for chest pain or palpitations  Gastrointestinal: Negative for vomiting, diarrhea and constipation Genitourinary: Negative for dysuria and  urgency Musculoskeletal: Negative for back pain, joint pain, myalgias.  Normal ROM  Neurological: Negative for dizziness and headaches    Physical Exam  Blood pressure 118/69, pulse 97, temperature 97.9 F (36.6 C), temperature source Oral, last menstrual period 11/12/2015. GENERAL: Well-developed, well-nourished female in no acute distress.  LUNGS: Clear to auscultation bilaterally.  HEART: Regular rate and rhythm. ABDOMEN: Soft, nontender, nondistended, gravid.  EXTREMITIES: Nontender, 1+-2+ edema edema, 2+ distal pulses. DTR's 2+ FHT:  150 doppler  Labs: No results found for this or any previous visit (from the past 24 hour(s)).   Assessment: Sierra Heath is  25 y.o. G2P1001 at 4629w0d presents with dependant edema, no concern for preeclampsia.  Plan: DC home  CRESENZO-DISHMAN,Toniyah Dilmore 9/25/201710:01 PM

## 2016-04-23 ENCOUNTER — Telehealth: Payer: Self-pay | Admitting: Women's Health

## 2016-04-23 MED ORDER — HYDROCORTISONE 2.5 % RE CREA: 1.0000 "application " | TOPICAL_CREAM | Freq: Two times a day (BID) | RECTAL | 0 refills | Status: DC

## 2016-04-23 NOTE — Telephone Encounter (Signed)
RX sent to pharmacy  

## 2016-04-28 ENCOUNTER — Telehealth: Payer: Self-pay | Admitting: *Deleted

## 2016-04-28 NOTE — Telephone Encounter (Signed)
Pt c/o cough x 10 days, coughing up green mucus, no fever. Pt given an appt for evaluation.

## 2016-05-01 ENCOUNTER — Encounter: Payer: Self-pay | Admitting: Women's Health

## 2016-05-07 ENCOUNTER — Encounter: Payer: Self-pay | Admitting: Advanced Practice Midwife

## 2016-05-07 ENCOUNTER — Ambulatory Visit (INDEPENDENT_AMBULATORY_CARE_PROVIDER_SITE_OTHER): Payer: Medicaid Other | Admitting: Advanced Practice Midwife

## 2016-05-07 VITALS — BP 120/70 | HR 90 | Temp 98.0°F | Wt 346.6 lb

## 2016-05-07 DIAGNOSIS — Z3A25 25 weeks gestation of pregnancy: Secondary | ICD-10-CM

## 2016-05-07 DIAGNOSIS — Z1389 Encounter for screening for other disorder: Secondary | ICD-10-CM

## 2016-05-07 DIAGNOSIS — Z331 Pregnant state, incidental: Secondary | ICD-10-CM

## 2016-05-07 DIAGNOSIS — O99212 Obesity complicating pregnancy, second trimester: Secondary | ICD-10-CM

## 2016-05-07 DIAGNOSIS — Z363 Encounter for antenatal screening for malformations: Secondary | ICD-10-CM

## 2016-05-07 DIAGNOSIS — Z3482 Encounter for supervision of other normal pregnancy, second trimester: Secondary | ICD-10-CM

## 2016-05-07 LAB — POCT URINALYSIS DIPSTICK
Blood, UA: NEGATIVE
GLUCOSE UA: NEGATIVE
Ketones, UA: NEGATIVE
LEUKOCYTES UA: NEGATIVE
NITRITE UA: NEGATIVE

## 2016-05-07 MED ORDER — AMOXICILLIN 500 MG PO CAPS: 500.0000 mg | ORAL_CAPSULE | Freq: Three times a day (TID) | ORAL | 0 refills | Status: DC

## 2016-05-07 NOTE — Patient Instructions (Signed)

## 2016-05-07 NOTE — Progress Notes (Signed)
G2P1001 3180w2d Estimated Date of Delivery: 08/25/16  Blood pressure 120/70, pulse 90, temperature 98 F (36.7 C), weight (!) 346 lb 9.6 oz (157.2 kg), last menstrual period 11/12/2015.   BP weight and urine results all reviewed and noted.  Please refer to the obstetrical flow sheet for the fundal height and fetal heart rate documentation:  Patient reports good fetal movement, denies any bleeding and no rupture of membranes symptoms or regular contractions. Patient has had productive cough and sinus drainage for 4 weeks.  Rx amoxicillin.  All questions were answered.  Orders Placed This Encounter  Procedures  . US OB Follow Up  . POCT urinalysis dipstick    Plan:  Continued routine obstetrical care,   Return in about 3 weeks (around 05/28/2016) for PN2/LROB, US:EFW. (limited views at 20 weeks)

## 2016-05-13 ENCOUNTER — Telehealth: Payer: Self-pay | Admitting: *Deleted

## 2016-05-13 NOTE — Telephone Encounter (Signed)
Pt c/o burning with urination, urinary frequency, urine has an strong odor. [place] and cannot come to the office. Appt made for tomorrow with Cathie BeamsFran Cresenzo-Dishmon, CNM.

## 2016-05-14 ENCOUNTER — Encounter: Payer: Self-pay | Admitting: Advanced Practice Midwife

## 2016-05-26 ENCOUNTER — Other Ambulatory Visit: Payer: Self-pay | Admitting: Advanced Practice Midwife

## 2016-05-26 DIAGNOSIS — Z0489 Encounter for examination and observation for other specified reasons: Secondary | ICD-10-CM

## 2016-05-26 DIAGNOSIS — IMO0002 Reserved for concepts with insufficient information to code with codable children: Secondary | ICD-10-CM

## 2016-05-27 ENCOUNTER — Ambulatory Visit (INDEPENDENT_AMBULATORY_CARE_PROVIDER_SITE_OTHER): Payer: Medicaid Other

## 2016-05-27 ENCOUNTER — Encounter: Payer: Self-pay | Admitting: Advanced Practice Midwife

## 2016-05-27 ENCOUNTER — Other Ambulatory Visit: Payer: Medicaid Other

## 2016-05-27 ENCOUNTER — Ambulatory Visit (INDEPENDENT_AMBULATORY_CARE_PROVIDER_SITE_OTHER): Payer: Medicaid Other | Admitting: Advanced Practice Midwife

## 2016-05-27 VITALS — BP 102/64 | HR 88 | Wt 343.6 lb

## 2016-05-27 DIAGNOSIS — Z3402 Encounter for supervision of normal first pregnancy, second trimester: Secondary | ICD-10-CM

## 2016-05-27 DIAGNOSIS — Z1389 Encounter for screening for other disorder: Secondary | ICD-10-CM

## 2016-05-27 DIAGNOSIS — Z3A27 27 weeks gestation of pregnancy: Secondary | ICD-10-CM

## 2016-05-27 DIAGNOSIS — Z362 Encounter for other antenatal screening follow-up: Secondary | ICD-10-CM | POA: Diagnosis not present

## 2016-05-27 DIAGNOSIS — Z3482 Encounter for supervision of other normal pregnancy, second trimester: Secondary | ICD-10-CM

## 2016-05-27 DIAGNOSIS — O99212 Obesity complicating pregnancy, second trimester: Secondary | ICD-10-CM

## 2016-05-27 DIAGNOSIS — Z0489 Encounter for examination and observation for other specified reasons: Secondary | ICD-10-CM

## 2016-05-27 DIAGNOSIS — Z131 Encounter for screening for diabetes mellitus: Secondary | ICD-10-CM

## 2016-05-27 DIAGNOSIS — IMO0002 Reserved for concepts with insufficient information to code with codable children: Secondary | ICD-10-CM

## 2016-05-27 DIAGNOSIS — Z363 Encounter for antenatal screening for malformations: Secondary | ICD-10-CM

## 2016-05-27 DIAGNOSIS — Z331 Pregnant state, incidental: Secondary | ICD-10-CM

## 2016-05-27 DIAGNOSIS — Z3483 Encounter for supervision of other normal pregnancy, third trimester: Secondary | ICD-10-CM

## 2016-05-27 LAB — POCT URINALYSIS DIPSTICK
Glucose, UA: NEGATIVE
KETONES UA: NEGATIVE
Nitrite, UA: NEGATIVE
Protein, UA: NEGATIVE
RBC UA: NEGATIVE

## 2016-05-27 NOTE — Progress Notes (Signed)
G2P1001 6855w1d Estimated Date of Delivery: 08/25/16  Blood pressure 102/64, pulse 88, weight (!) 343 lb 9.6 oz (155.9 kg), last menstrual period 11/12/2015.   BP weight and urine results all reviewed and noted.  Please refer to the obstetrical flow sheet for the fundal height and fetal heart rate documentation:  US 27+1 wks,cephalic,cx 4.6 cm,post pl gr 0,bilat adnexa's wnl,svp of fluid 6.9 cm,fhr 169 bpm,efw 1144 g 63%,still unable to visualize heart anatomy and diaphragm because of pt body habitus  Patient reports good fetal movement, denies any bleeding and no rupture of membranes symptoms or regular contractions. Patient is without complaints other than some persistant left sided abdominal wall (?) pain  All questions were answered.  Orders Placed This Encounter  Procedures  . US OB Follow Up  . POCT urinalysis dipstick    Plan:  Continued routine obstetrical care, amber wants US repeated at 30 weeks  Return in about 3 weeks (around 06/17/2016) for LROB, US.

## 2016-05-27 NOTE — Progress Notes (Signed)
US 27+1 wks,cephalic,cx 4.6 cm,post pl gr 0,bilat adnexa's wnl,svp of fluid 6.9 cm,fhr 169 bpm,efw 1144 g 63%,still unable to visualize heart anatomy and diaphragm because of pt body habitus.

## 2016-05-28 ENCOUNTER — Encounter: Payer: Self-pay | Admitting: Advanced Practice Midwife

## 2016-05-28 ENCOUNTER — Other Ambulatory Visit: Payer: Self-pay | Admitting: Advanced Practice Midwife

## 2016-05-28 DIAGNOSIS — Z3402 Encounter for supervision of normal first pregnancy, second trimester: Secondary | ICD-10-CM

## 2016-05-28 LAB — CBC
HEMATOCRIT: 28.1 % — AB (ref 34.0–46.6)
HEMOGLOBIN: 8.8 g/dL — AB (ref 11.1–15.9)
MCH: 22.9 pg — ABNORMAL LOW (ref 26.6–33.0)
MCHC: 31.3 g/dL — ABNORMAL LOW (ref 31.5–35.7)
MCV: 73 fL — ABNORMAL LOW (ref 79–97)
Platelets: 215 10*3/uL (ref 150–379)
RBC: 3.84 x10E6/uL (ref 3.77–5.28)
RDW: 16.8 % — AB (ref 12.3–15.4)
WBC: 8.5 10*3/uL (ref 3.4–10.8)

## 2016-05-28 LAB — ANTIBODY SCREEN: ANTIBODY SCREEN: NEGATIVE

## 2016-05-28 LAB — RPR: RPR: NONREACTIVE

## 2016-05-28 LAB — GLUCOSE TOLERANCE, 2 HOURS W/ 1HR
GLUCOSE, FASTING: 84 mg/dL (ref 65–91)
Glucose, 1 hour: 155 mg/dL (ref 65–179)
Glucose, 2 hour: 144 mg/dL (ref 65–152)

## 2016-05-28 LAB — HIV ANTIBODY (ROUTINE TESTING W REFLEX): HIV SCREEN 4TH GENERATION: NONREACTIVE

## 2016-05-28 MED ORDER — FERROUS SULFATE 325 (65 FE) MG PO TABS: 325.0000 mg | ORAL_TABLET | Freq: Two times a day (BID) | ORAL | 6 refills | Status: DC

## 2016-05-28 NOTE — Progress Notes (Unsigned)
rx FESO4 BID

## 2016-06-05 ENCOUNTER — Telehealth: Payer: Self-pay | Admitting: Advanced Practice Midwife

## 2016-06-05 NOTE — Telephone Encounter (Signed)
Patient called stating she has a tooth ache and won't be able to see a dentist until Monday.  Advised patient she could take extra strength Tylenol or even Oragel. Patient stated she would try that to see if it helps

## 2016-06-17 ENCOUNTER — Other Ambulatory Visit: Payer: Self-pay | Admitting: Advanced Practice Midwife

## 2016-06-17 DIAGNOSIS — Z0489 Encounter for examination and observation for other specified reasons: Secondary | ICD-10-CM

## 2016-06-17 DIAGNOSIS — IMO0002 Reserved for concepts with insufficient information to code with codable children: Secondary | ICD-10-CM

## 2016-06-18 ENCOUNTER — Encounter: Payer: Self-pay | Admitting: Women's Health

## 2016-06-18 ENCOUNTER — Ambulatory Visit (INDEPENDENT_AMBULATORY_CARE_PROVIDER_SITE_OTHER): Payer: Medicaid Other

## 2016-06-18 ENCOUNTER — Ambulatory Visit (INDEPENDENT_AMBULATORY_CARE_PROVIDER_SITE_OTHER): Payer: Medicaid Other | Admitting: Women's Health

## 2016-06-18 VITALS — BP 120/70 | HR 80 | Wt 339.6 lb

## 2016-06-18 DIAGNOSIS — Z0489 Encounter for examination and observation for other specified reasons: Secondary | ICD-10-CM

## 2016-06-18 DIAGNOSIS — Z362 Encounter for other antenatal screening follow-up: Secondary | ICD-10-CM

## 2016-06-18 DIAGNOSIS — O321XX1 Maternal care for breech presentation, fetus 1: Secondary | ICD-10-CM | POA: Diagnosis not present

## 2016-06-18 DIAGNOSIS — Z3483 Encounter for supervision of other normal pregnancy, third trimester: Secondary | ICD-10-CM

## 2016-06-18 DIAGNOSIS — IMO0002 Reserved for concepts with insufficient information to code with codable children: Secondary | ICD-10-CM

## 2016-06-18 DIAGNOSIS — Z331 Pregnant state, incidental: Secondary | ICD-10-CM

## 2016-06-18 DIAGNOSIS — Z3A31 31 weeks gestation of pregnancy: Secondary | ICD-10-CM

## 2016-06-18 DIAGNOSIS — O99013 Anemia complicating pregnancy, third trimester: Secondary | ICD-10-CM

## 2016-06-18 DIAGNOSIS — O1213 Gestational proteinuria, third trimester: Secondary | ICD-10-CM

## 2016-06-18 DIAGNOSIS — Z1389 Encounter for screening for other disorder: Secondary | ICD-10-CM

## 2016-06-18 DIAGNOSIS — Z98891 History of uterine scar from previous surgery: Secondary | ICD-10-CM

## 2016-06-18 LAB — POCT URINALYSIS DIPSTICK
Blood, UA: NEGATIVE
GLUCOSE UA: NEGATIVE
Ketones, UA: NEGATIVE
LEUKOCYTES UA: NEGATIVE
NITRITE UA: NEGATIVE

## 2016-06-18 NOTE — Progress Notes (Signed)
Low-risk OB appointment G2P1001 454w2d Estimated Date of Delivery: 08/25/16 BP 120/70   Pulse 80   Wt (!) 339 lb 9.6 oz (154 kg)   LMP 11/12/2015   BMI 54.81 kg/m   BP, weight, and urine reviewed.  Refer to obstetrical flow sheet for FH & FHR.  Reports good fm.  Denies regular uc's, lof, vb, or uti s/s. Pressure, thought she might have uti, no dysuria/frequency/urgency, just pressure when walking- discussed likely normal from pregnancy, but will send urine cx.  Has not picked up Fe, makes her not be able to have bm. Discussed Hgb very low @ 8.8 and if she doesn't take Fe to get it up will need IV Fe, states she will pick up today and start taking. Gave constipation prevention/relief measures, info on Fe-rich foods. Check Hgb next visit.  Reviewed today's f/u u/s for unseen anatomy- still suboptimal d/t body habitus, unable to adequately visualize heart/diaphragm, efw 57%/afi 10.2cm. Wants RLTCS w/ LHE Plan:  Continue routine obstetrical care  F/U in 2wks for OB appointment and Hgb check

## 2016-06-18 NOTE — Patient Instructions (Addendum)
Call the office [phone]) or go to [facility] if:  You begin to have strong, frequent contractions  Your water breaks.  Sometimes it is a big gush of fluid, sometimes it is just a trickle that keeps getting your panties wet or running down your legs  You have vaginal bleeding.  It is normal to have a small amount of spotting if your cervix was checked.   You don't feel your baby moving like normal.  If you don't, get you something to eat and drink and lay down and focus on feeling your baby move.  You should feel at least 10 movements in 2 hours.  If you don't, you should call the office or go to [facility].    Constipation  Drink plenty of fluid, preferably water, throughout the day  Eat foods high in fiber such as fruits, vegetables, and grains  Exercise, such as walking, is a good way to keep your bowels regular  Drink warm fluids, especially warm prune juice, or decaf coffee  Eat a 1/2 cup of real oatmeal (not instant), 1/2 cup applesauce, and 1/2-1 cup warm prune juice every day  If needed, you may take Colace (docusate sodium) stool softener once or twice a day to help keep the stool soft. If you are pregnant, wait until you are out of your first trimester (12-14 weeks of pregnancy)  If you still are having problems with constipation, you may take Miralax once daily as needed to help keep your bowels regular.  If you are pregnant, wait until you are out of your first trimester (12-14 weeks of pregnancy)  Tdap Vaccine  It is recommended that you get the Tdap vaccine during the third trimester of EACH pregnancy to help protect your baby from getting pertussis (whooping cough)  27-36 weeks is the BEST time to do this so that you can pass the protection on to your baby. During pregnancy is better than after pregnancy, but if you are unable to get it during pregnancy it will be offered at the hospital.   You can get this vaccine at the health department or your family  doctor  Everyone who will be around your baby should also be up-to-date on their vaccines. Adults (who are not pregnant) only need 1 dose of Tdap during adulthood.    Preterm Labor and Birth Information The normal length of a pregnancy is 39-41 weeks. Preterm labor is when labor starts before 37 completed weeks of pregnancy. What are the risk factors for preterm labor? Preterm labor is more likely to occur in women who: Have certain infections during pregnancy such as a bladder infection, sexually transmitted infection, or infection inside the uterus (chorioamnionitis). Have a shorter-than-normal cervix. Have gone into preterm labor before. Have had surgery on their cervix. Are younger than age 107 or older than age 89. Are African American. Are pregnant with twins or multiple babies (multiple gestation). Take street drugs or smoke while pregnant. Do not gain enough weight while pregnant. Became pregnant shortly after having been pregnant. What are the symptoms of preterm labor? Symptoms of preterm labor include: Cramps similar to those that can happen during a menstrual period. The cramps may happen with diarrhea. Pain in the abdomen or lower back. Regular uterine contractions that may feel like tightening of the abdomen. A feeling of increased pressure in the pelvis. Increased watery or bloody mucus discharge from the vagina. Water breaking (ruptured amniotic sac). Why is it important to recognize signs of preterm labor?  It is important to recognize signs of preterm labor because babies who are born prematurely may not be fully developed. This can put them at an increased risk for: Long-term (chronic) heart and lung problems. Difficulty immediately after birth with regulating body systems, including blood sugar, body temperature, heart rate, and breathing rate. Bleeding in the brain. Cerebral palsy. Learning difficulties. Death. These risks are highest for babies who are born  before 20 weeks of pregnancy. How is preterm labor treated? Treatment depends on the length of your pregnancy, your condition, and the health of your baby. It may involve: Having a stitch (suture) placed in your cervix to prevent your cervix from opening too early (cerclage). Taking or being given medicines, such as: Hormone medicines. These may be given early in pregnancy to help support the pregnancy. Medicine to stop contractions. Medicines to help mature the baby's lungs. These may be prescribed if the risk of delivery is high. Medicines to prevent your baby from developing cerebral palsy. If the labor happens before 34 weeks of pregnancy, you may need to stay in the hospital. What should I do if I think I am in preterm labor? If you think that you are going into preterm labor, call your health care provider right away. How can I prevent preterm labor in future pregnancies? To increase your chance of having a full-term pregnancy: Do not use any tobacco products, such as cigarettes, chewing tobacco, and e-cigarettes. If you need help quitting, ask your health care provider. Do not use street drugs or medicines that have not been prescribed to you during your pregnancy. Talk with your health care provider before taking any herbal supplements, even if you have been taking them regularly. Make sure you gain a healthy amount of weight during your pregnancy. Watch for infection. If you think that you might have an infection, get it checked right away. Make sure to tell your health care provider if you have gone into preterm labor before. This information is not intended to replace advice given to you by your health care provider. Make sure you discuss any questions you have with your health care provider. Document Released: 09/27/2003 Document Revised: 12/18/2015 Document Reviewed: 11/28/2015 Elsevier Interactive Patient Education  2017 [place].   Iron-Rich Diet Introduction Iron is a  mineral that helps your body to produce hemoglobin. Hemoglobin is a protein in your red blood cells that carries oxygen to your body's tissues. Eating too little iron may cause you to feel weak and tired, and it can increase your risk for infection. Eating enough iron is necessary for your body's metabolism, muscle function, and nervous system. Iron is naturally found in many foods. It can also be added to foods or fortified in foods. There are two types of dietary iron:  Heme iron. Heme iron is absorbed by the body more easily than nonheme iron. Heme iron is found in meat, poultry, and fish.  Nonheme iron. Nonheme iron is found in dietary supplements, iron-fortified grains, beans, and vegetables. You may need to follow an iron-rich diet if:  You have been diagnosed with iron deficiency or iron-deficiency anemia.  You have a condition that prevents you from absorbing dietary iron, such as:  Infection in your intestines.  Celiac disease. This involves long-lasting (chronic) inflammation of your intestines.  You do not eat enough iron.  You eat a diet that is high in foods that impair iron absorption.  You have lost a lot of blood.  You have heavy bleeding during  your menstrual cycle.  You are pregnant. What is my plan? Your health care provider may help you to determine how much iron you need per day based on your condition. Generally, when a person consumes sufficient amounts of iron in the diet, the following iron needs are met:  Men.  31-37 years old: 11 mg per day.  17-50 years old: 8 mg per day.  Women.  52-37 years old: 15 mg per day.  71-76 years old: 18 mg per day.  Over 76 years old: 8 mg per day.  Pregnant women: 27 mg per day.  Breastfeeding women: 9 mg per day. What do I need to know about an iron-rich diet?  Eat fresh fruits and vegetables that are high in vitamin C along with foods that are high in iron. This will help increase the amount of iron that your  body absorbs from food, especially with foods containing nonheme iron. Foods that are high in vitamin C include oranges, peppers, tomatoes, and mango.  Take iron supplements only as directed by your health care provider. Overdose of iron can be life-threatening. If you were prescribed iron supplements, take them with orange juice or a vitamin C supplement.  Cook foods in pots and pans that are made from iron.  Eat nonheme iron-containing foods alongside foods that are high in heme iron. This helps to improve your iron absorption.  Certain foods and drinks contain compounds that impair iron absorption. Avoid eating these foods in the same meal as iron-rich foods or with iron supplements. These include:  Coffee, black tea, and red wine.  Milk, dairy products, and foods that are high in calcium.  Beans, soybeans, and peas.  Whole grains.  When eating foods that contain both nonheme iron and compounds that impair iron absorption, follow these tips to absorb iron better.  Soak beans overnight before cooking.  Soak whole grains overnight and drain them before using.  Ferment flours before baking, such as using yeast in bread dough. What foods can I eat? Grains  Iron-fortified breakfast cereal. Iron-fortified whole-wheat bread. Enriched rice. Sprouted grains. Vegetables  Spinach. Potatoes with skin. Green peas. Broccoli. Red and green bell peppers. Fermented vegetables. Fruits  Prunes. Raisins. Oranges. Strawberries. Mango. Grapefruit. Meats and Other Protein Sources  Beef liver. Oysters. Beef. Shrimp. [place]. Chicken. [place]. Sardines. Chickpeas. Nuts. Tofu. Beverages  Tomato juice. Fresh orange juice. Prune juice. Hibiscus tea. Fortified instant breakfast shakes. Condiments  Tahini. Fermented soy sauce. Sweets and Desserts  Black-strap molasses. Other  Wheat germ. The items listed above may not be a complete list of recommended foods or beverages. Contact your dietitian for more  options.  What foods are not recommended? Grains  Whole grains. Bran cereal. Bran flour. Oats. Vegetables  Artichokes. Brussels sprouts. Kale. Fruits  Blueberries. Raspberries. Strawberries. Figs. Meats and Other Protein Sources  Soybeans. Products made from soy protein. Dairy  Milk. Cream. Cheese. Yogurt. Cottage cheese. Beverages  Coffee. Black tea. Red wine. Sweets and Desserts  Cocoa. Chocolate. Ice cream. Other  Bas[place]. Oregano. Parsley. The items listed above may not be a complete list of foods and beverages to avoid. Contact your dietitian for more information.  This information is not intended to replace advice given to you by your health care provider. Make sure you discuss any questions you have with your health care provider. Document Released: 02/18/2005 Document Revised: 01/25/2016 Document Reviewed: 02/01/2014  2017 Elsevier

## 2016-06-18 NOTE — Progress Notes (Signed)
US 30+2 wks,breech,post pl gr 0,bilat adnexa's wnl,fhr 140 bpm,afi 10.2 cm,efw 1633 g 57%,still unable to visualize the heart and diaphragm,no obvious abnormalities seen,limited ultrasound because of pt body habitus.

## 2016-06-20 LAB — URINE CULTURE

## 2016-06-27 ENCOUNTER — Telehealth: Payer: Self-pay | Admitting: *Deleted

## 2016-06-27 ENCOUNTER — Ambulatory Visit (INDEPENDENT_AMBULATORY_CARE_PROVIDER_SITE_OTHER): Payer: Medicaid Other | Admitting: Obstetrics & Gynecology

## 2016-06-27 VITALS — BP 128/68 | HR 96 | Wt 341.4 lb

## 2016-06-27 DIAGNOSIS — R0609 Other forms of dyspnea: Secondary | ICD-10-CM

## 2016-06-27 DIAGNOSIS — Z3A32 32 weeks gestation of pregnancy: Secondary | ICD-10-CM

## 2016-06-27 DIAGNOSIS — O1223 Gestational edema with proteinuria, third trimester: Secondary | ICD-10-CM

## 2016-06-27 DIAGNOSIS — Z3483 Encounter for supervision of other normal pregnancy, third trimester: Secondary | ICD-10-CM

## 2016-06-27 DIAGNOSIS — O99013 Anemia complicating pregnancy, third trimester: Secondary | ICD-10-CM

## 2016-06-27 DIAGNOSIS — Z331 Pregnant state, incidental: Secondary | ICD-10-CM

## 2016-06-27 DIAGNOSIS — Z1389 Encounter for screening for other disorder: Secondary | ICD-10-CM

## 2016-06-27 LAB — POCT URINALYSIS DIPSTICK
Blood, UA: NEGATIVE
GLUCOSE UA: NEGATIVE
KETONES UA: NEGATIVE
Leukocytes, UA: NEGATIVE
Nitrite, UA: NEGATIVE

## 2016-06-27 NOTE — Progress Notes (Signed)
   Subjective:    Patient ID: Sierra Heath, female    DOB: [birth year], 25 y.o.   MRN: [id number]  HPI 4128w4d Estimated Date of Delivery: 08/25/16 G2P1001 Complaining of some shortness of breath when she walks around No coughing no fever or chills Does not feel ill   Review of Systems     Objective:   Physical Exam Lungs clear good air movement no wheezes no consolidation no rales or crackles  Pulse ox 97% CV RRR without MGR Bilateral pedal edema L>R Negative Homan's sign       Assessment & Plan:  3928w4d with dyspnea of pregnncy, clinically insignificant No s/s DVT  Keep scheduled appt next week

## 2016-06-27 NOTE — Telephone Encounter (Signed)
Spoke with pt. Pt states it feels like somebody sitting on chest. It's hard to breathe. Pt states she hasn't been congested. I spoke with Selena BattenKim, CNM and she advised she needs to be seen. Kim had a 12:00 appt available. JSY

## 2016-07-03 ENCOUNTER — Encounter: Payer: Self-pay | Admitting: Obstetrics & Gynecology

## 2016-07-03 ENCOUNTER — Ambulatory Visit (INDEPENDENT_AMBULATORY_CARE_PROVIDER_SITE_OTHER): Payer: Medicaid Other | Admitting: Obstetrics & Gynecology

## 2016-07-03 VITALS — BP 120/80 | HR 78 | Wt 340.0 lb

## 2016-07-03 DIAGNOSIS — Z331 Pregnant state, incidental: Secondary | ICD-10-CM

## 2016-07-03 DIAGNOSIS — O99013 Anemia complicating pregnancy, third trimester: Secondary | ICD-10-CM

## 2016-07-03 DIAGNOSIS — Z3483 Encounter for supervision of other normal pregnancy, third trimester: Secondary | ICD-10-CM

## 2016-07-03 DIAGNOSIS — Z3A33 33 weeks gestation of pregnancy: Secondary | ICD-10-CM

## 2016-07-03 DIAGNOSIS — Z1389 Encounter for screening for other disorder: Secondary | ICD-10-CM

## 2016-07-03 DIAGNOSIS — D649 Anemia, unspecified: Secondary | ICD-10-CM

## 2016-07-03 DIAGNOSIS — Z98891 History of uterine scar from previous surgery: Secondary | ICD-10-CM

## 2016-07-03 LAB — POCT URINALYSIS DIPSTICK
GLUCOSE UA: NEGATIVE
Leukocytes, UA: NEGATIVE
NITRITE UA: NEGATIVE
Protein, UA: 1
RBC UA: NEGATIVE

## 2016-07-03 LAB — POCT HEMOGLOBIN: Hemoglobin: 8.8 g/dL — AB (ref 12.2–16.2)

## 2016-07-03 NOTE — Progress Notes (Signed)
G2P1001 274w3d Estimated Date of Delivery: 08/25/16  Blood pressure 120/80, pulse 78, weight (!) 340 lb (154.2 kg), last menstrual period 11/12/2015.   BP weight and urine results all reviewed and noted.  Please refer to the obstetrical flow sheet for the fundal height and fetal heart rate documentation:  Patient reports good fetal movement, denies any bleeding and no rupture of membranes symptoms or regular contractions. Patient is without complaints. All questions were answered.  Orders Placed This Encounter  Procedures  . POCT urinalysis dipstick    Plan:  Continued routine obstetrical care, hemoglobin 8.8, stable Check again in 2 weeks  Return in about 2 weeks (around 07/17/2016) for LROB.

## 2016-07-03 NOTE — Addendum Note (Signed)
Addended by: Federico FlakeNES, PEGGY A on: 07/03/2016 11:20 AM   Modules accepted: Orders

## 2016-07-17 ENCOUNTER — Encounter: Payer: Self-pay | Admitting: Obstetrics & Gynecology

## 2016-07-17 ENCOUNTER — Ambulatory Visit (INDEPENDENT_AMBULATORY_CARE_PROVIDER_SITE_OTHER): Payer: Medicaid Other | Admitting: Obstetrics & Gynecology

## 2016-07-17 VITALS — BP 119/50 | HR 100 | Wt 349.0 lb

## 2016-07-17 DIAGNOSIS — O99213 Obesity complicating pregnancy, third trimester: Secondary | ICD-10-CM

## 2016-07-17 DIAGNOSIS — Z1389 Encounter for screening for other disorder: Secondary | ICD-10-CM

## 2016-07-17 DIAGNOSIS — Z3A35 35 weeks gestation of pregnancy: Secondary | ICD-10-CM

## 2016-07-17 DIAGNOSIS — Z3483 Encounter for supervision of other normal pregnancy, third trimester: Secondary | ICD-10-CM

## 2016-07-17 DIAGNOSIS — Z331 Pregnant state, incidental: Secondary | ICD-10-CM

## 2016-07-17 LAB — POCT URINALYSIS DIPSTICK
GLUCOSE UA: NEGATIVE
KETONES UA: NEGATIVE
Leukocytes, UA: NEGATIVE
Nitrite, UA: NEGATIVE
Protein, UA: 1
RBC UA: NEGATIVE

## 2016-07-17 NOTE — Progress Notes (Signed)
G2P1001 2322w3d Estimated Date of Delivery: 08/25/16  Blood pressure (!) 119/50, pulse 100, weight (!) 349 lb (158.3 kg), last menstrual period 11/12/2015.   BP weight and urine results all reviewed and noted.  Please refer to the obstetrical flow sheet for the fundal height and fetal heart rate documentation:  Patient reports good fetal movement, denies any bleeding and no rupture of membranes symptoms or regular contractions. Patient is without complaints. All questions were answered.  Orders Placed This Encounter  Procedures  . POCT urinalysis dipstick    Plan:  Continued routine obstetrical care,   Return in about 2 weeks (around 07/31/2016) for LROB.

## 2016-07-17 NOTE — Progress Notes (Signed)
G2P1001 166w3d Estimated Date of Delivery: 08/25/16  Blood pressure (!) 119/50, pulse 100, weight (!) 349 lb (158.3 kg), last menstrual period 11/12/2015.   BP weight and urine results all reviewed and noted.  Please refer to the obstetrical flow sheet for the fundal height and fetal heart rate documentation:  Patient reports good fetal movement, denies any bleeding and no rupture of membranes symptoms or regular contractions. Patient is without complaints. All questions were answered.  Orders Placed This Encounter  Procedures  . POCT urinalysis dipstick    Plan:  Continued routine obstetrical care, U+19, hover method 35 cm  Return in about 2 weeks (around 07/31/2016) for LROB.

## 2016-07-31 ENCOUNTER — Ambulatory Visit (INDEPENDENT_AMBULATORY_CARE_PROVIDER_SITE_OTHER): Payer: Medicaid Other | Admitting: Obstetrics & Gynecology

## 2016-07-31 ENCOUNTER — Encounter: Payer: Self-pay | Admitting: Obstetrics & Gynecology

## 2016-07-31 VITALS — BP 122/66 | HR 101 | Wt 349.0 lb

## 2016-07-31 DIAGNOSIS — O34219 Maternal care for unspecified type scar from previous cesarean delivery: Secondary | ICD-10-CM

## 2016-07-31 DIAGNOSIS — Z1389 Encounter for screening for other disorder: Secondary | ICD-10-CM

## 2016-07-31 DIAGNOSIS — Z331 Pregnant state, incidental: Secondary | ICD-10-CM

## 2016-07-31 DIAGNOSIS — Z3A36 36 weeks gestation of pregnancy: Secondary | ICD-10-CM

## 2016-07-31 DIAGNOSIS — Z98891 History of uterine scar from previous surgery: Secondary | ICD-10-CM

## 2016-07-31 DIAGNOSIS — O99213 Obesity complicating pregnancy, third trimester: Secondary | ICD-10-CM

## 2016-07-31 DIAGNOSIS — Z3483 Encounter for supervision of other normal pregnancy, third trimester: Secondary | ICD-10-CM

## 2016-07-31 LAB — POCT URINALYSIS DIPSTICK
GLUCOSE UA: NEGATIVE
KETONES UA: NEGATIVE
Leukocytes, UA: NEGATIVE
Nitrite, UA: NEGATIVE
Protein, UA: 1
RBC UA: NEGATIVE

## 2016-07-31 NOTE — Progress Notes (Signed)
G2P1001 3227w3d Estimated Date of Delivery: 08/25/16  Blood pressure 122/66, pulse (!) 101, weight (!) 349 lb (158.3 kg), last menstrual period 11/12/2015.   BP weight and urine results all reviewed and noted.  Please refer to the obstetrical flow sheet for the fundal height and fetal heart rate documentation:  Patient reports good fetal movement, denies any bleeding and no rupture of membranes symptoms or regular contractions. Patient is without complaints. All questions were answered.  Orders Placed This Encounter  Procedures  . POCT urinalysis dipstick    Plan:  Continued routine obstetrical care, C section scheduled for 08/21/2015, no tubal ligation  Return in about 1 week (around 08/07/2016) for LROB, with Dr Despina HiddenEure.

## 2016-08-05 ENCOUNTER — Encounter: Payer: Medicaid Other | Admitting: Advanced Practice Midwife

## 2016-08-05 ENCOUNTER — Telehealth: Payer: Self-pay | Admitting: Advanced Practice Midwife

## 2016-08-05 NOTE — Telephone Encounter (Signed)
Spoke with pt, she has an appointment here @ 3 pm to have boil looked at.

## 2016-08-08 ENCOUNTER — Ambulatory Visit (INDEPENDENT_AMBULATORY_CARE_PROVIDER_SITE_OTHER): Payer: Medicaid Other | Admitting: Obstetrics & Gynecology

## 2016-08-08 ENCOUNTER — Encounter: Payer: Self-pay | Admitting: Obstetrics & Gynecology

## 2016-08-08 VITALS — BP 138/82 | HR 70 | Wt 351.2 lb

## 2016-08-08 DIAGNOSIS — O99213 Obesity complicating pregnancy, third trimester: Secondary | ICD-10-CM

## 2016-08-08 DIAGNOSIS — Z3685 Encounter for antenatal screening for Streptococcus B: Secondary | ICD-10-CM

## 2016-08-08 DIAGNOSIS — Z3A38 38 weeks gestation of pregnancy: Secondary | ICD-10-CM

## 2016-08-08 DIAGNOSIS — Z331 Pregnant state, incidental: Secondary | ICD-10-CM

## 2016-08-08 DIAGNOSIS — Z1389 Encounter for screening for other disorder: Secondary | ICD-10-CM

## 2016-08-08 DIAGNOSIS — Z3483 Encounter for supervision of other normal pregnancy, third trimester: Secondary | ICD-10-CM

## 2016-08-08 LAB — POCT URINALYSIS DIPSTICK
GLUCOSE UA: NEGATIVE
Ketones, UA: NEGATIVE
Leukocytes, UA: NEGATIVE
NITRITE UA: NEGATIVE
RBC UA: NEGATIVE

## 2016-08-08 LAB — OB RESULTS CONSOLE GBS: GBS: NEGATIVE

## 2016-08-08 MED ORDER — SILVER SULFADIAZINE 1 % EX CREA: 1.0000 "application " | TOPICAL_CREAM | Freq: Every day | CUTANEOUS | 0 refills | Status: DC

## 2016-08-08 NOTE — Progress Notes (Signed)
G2P1001 6660w4d Estimated Date of Delivery: 08/25/16  Blood pressure 138/82, pulse 70, weight (!) 351 lb 3.2 oz (159.3 kg), last menstrual period 11/12/2015.   BP weight and urine results all reviewed and noted.  Please refer to the obstetrical flow sheet for the fundal height and fetal heart rate documentation:  Patient reports good fetal movement, denies any bleeding and no rupture of membranes symptoms or regular contractions. Patient is without complaints. All questions were answered.  Orders Placed This Encounter  Procedures  . GC/Chlamydia Probe Amp  . Culture, beta strep (group b only)  . POCT urinalysis dipstick    Plan:  Continued routine obstetrical care, U+22 cm FH  No Follow-up on file.

## 2016-08-08 NOTE — Addendum Note (Signed)
Addended by: Lazaro ArmsEURE, LUTHER H on: 08/08/2016 11:44 AM   Modules accepted: Orders

## 2016-08-10 LAB — GC/CHLAMYDIA PROBE AMP
CHLAMYDIA, DNA PROBE: NEGATIVE
Neisseria gonorrhoeae by PCR: NEGATIVE

## 2016-08-12 ENCOUNTER — Encounter (HOSPITAL_COMMUNITY): Payer: Self-pay

## 2016-08-12 LAB — CULTURE, BETA STREP (GROUP B ONLY): STREP GP B CULTURE: NEGATIVE

## 2016-08-13 ENCOUNTER — Encounter: Payer: Self-pay | Admitting: Advanced Practice Midwife

## 2016-08-13 ENCOUNTER — Ambulatory Visit (INDEPENDENT_AMBULATORY_CARE_PROVIDER_SITE_OTHER): Payer: Medicaid Other | Admitting: Advanced Practice Midwife

## 2016-08-13 VITALS — BP 120/60 | HR 80 | Wt 356.0 lb

## 2016-08-13 DIAGNOSIS — Z331 Pregnant state, incidental: Secondary | ICD-10-CM

## 2016-08-13 DIAGNOSIS — O99213 Obesity complicating pregnancy, third trimester: Secondary | ICD-10-CM

## 2016-08-13 DIAGNOSIS — Z3A38 38 weeks gestation of pregnancy: Secondary | ICD-10-CM

## 2016-08-13 DIAGNOSIS — O99013 Anemia complicating pregnancy, third trimester: Secondary | ICD-10-CM

## 2016-08-13 DIAGNOSIS — Z1389 Encounter for screening for other disorder: Secondary | ICD-10-CM

## 2016-08-13 DIAGNOSIS — Z3483 Encounter for supervision of other normal pregnancy, third trimester: Secondary | ICD-10-CM

## 2016-08-13 LAB — POCT URINALYSIS DIPSTICK
Glucose, UA: NEGATIVE
Ketones, UA: NEGATIVE
LEUKOCYTES UA: NEGATIVE
Nitrite, UA: NEGATIVE
PROTEIN UA: 1
RBC UA: NEGATIVE

## 2016-08-13 LAB — POCT HEMOGLOBIN: HEMOGLOBIN: 7.6 g/dL — AB (ref 12.2–16.2)

## 2016-08-13 NOTE — Progress Notes (Signed)
G2P1001 5819w2d Estimated Date of Delivery: 08/25/16  Blood pressure 120/60, pulse 80, weight (!) 356 lb (161.5 kg), last menstrual period 11/12/2015.   BP weight and urine results all reviewed and noted.  Please refer to the obstetrical flow sheet for the fundal height and fetal heart rate documentation:  Patient reports good fetal movement, denies any bleeding and no rupture of membranes symptoms or regular contractions. Patient is without complaints. All questions were answered. CS scheduled for 1/31.   Has been on FeSO4 (only QD rather than BID as Rx'd) for 1 month.  Hgb today:  7.6 Consulted w/[facility].  [facility], doesn't think she needs preop blood.  Orders already in for crossmatch  Orders Placed This Encounter  Procedures  . POCT urinalysis dipstick    Plan:  Continued routine obstetrical care,   Return in about 2 weeks (around 08/27/2016) for incision check.

## 2016-08-16 ENCOUNTER — Encounter (HOSPITAL_COMMUNITY): Payer: Self-pay

## 2016-08-16 ENCOUNTER — Inpatient Hospital Stay (HOSPITAL_COMMUNITY)
Admission: AD | Admit: 2016-08-16 | Discharge: 2016-08-18 | DRG: 765 | Disposition: A | Discharge status: Home / Self Care | Payer: Medicaid Other | Source: Ambulatory Visit | Attending: Obstetrics and Gynecology | Admitting: Obstetrics and Gynecology

## 2016-08-16 DIAGNOSIS — D649 Anemia, unspecified: Secondary | ICD-10-CM | POA: Diagnosis present

## 2016-08-16 DIAGNOSIS — Z98891 History of uterine scar from previous surgery: Secondary | ICD-10-CM

## 2016-08-16 DIAGNOSIS — O34211 Maternal care for low transverse scar from previous cesarean delivery: Principal | ICD-10-CM | POA: Diagnosis present

## 2016-08-16 DIAGNOSIS — Z3483 Encounter for supervision of other normal pregnancy, third trimester: Secondary | ICD-10-CM

## 2016-08-16 DIAGNOSIS — O9962 Diseases of the digestive system complicating childbirth: Secondary | ICD-10-CM | POA: Diagnosis present

## 2016-08-16 DIAGNOSIS — Z6841 Body Mass Index (BMI) 40.0 and over, adult: Secondary | ICD-10-CM

## 2016-08-16 DIAGNOSIS — IMO0002 Reserved for concepts with insufficient information to code with codable children: Secondary | ICD-10-CM

## 2016-08-16 DIAGNOSIS — Z3A38 38 weeks gestation of pregnancy: Secondary | ICD-10-CM

## 2016-08-16 DIAGNOSIS — Z833 Family history of diabetes mellitus: Secondary | ICD-10-CM

## 2016-08-16 DIAGNOSIS — O99214 Obesity complicating childbirth: Secondary | ICD-10-CM | POA: Diagnosis present

## 2016-08-16 DIAGNOSIS — Z8249 Family history of ischemic heart disease and other diseases of the circulatory system: Secondary | ICD-10-CM

## 2016-08-16 DIAGNOSIS — G4730 Sleep apnea, unspecified: Secondary | ICD-10-CM | POA: Diagnosis present

## 2016-08-16 DIAGNOSIS — O9902 Anemia complicating childbirth: Secondary | ICD-10-CM | POA: Diagnosis present

## 2016-08-16 DIAGNOSIS — O99354 Diseases of the nervous system complicating childbirth: Secondary | ICD-10-CM | POA: Diagnosis present

## 2016-08-16 DIAGNOSIS — O4292 Full-term premature rupture of membranes, unspecified as to length of time between rupture and onset of labor: Secondary | ICD-10-CM | POA: Diagnosis present

## 2016-08-16 LAB — PREPARE RBC (CROSSMATCH)

## 2016-08-16 LAB — CBC
HEMATOCRIT: 27.9 % — AB (ref 36.0–46.0)
HEMOGLOBIN: 8.3 g/dL — AB (ref 12.0–15.0)
MCH: 20.4 pg — ABNORMAL LOW (ref 26.0–34.0)
MCHC: 29.7 g/dL — AB (ref 30.0–36.0)
MCV: 68.7 fL — AB (ref 78.0–100.0)
Platelets: 335 10*3/uL (ref 150–400)
RBC: 4.06 MIL/uL (ref 3.87–5.11)
RDW: 18 % — AB (ref 11.5–15.5)
WBC: 12.8 10*3/uL — ABNORMAL HIGH (ref 4.0–10.5)

## 2016-08-16 LAB — ABO/RH: ABO/RH(D): O POS

## 2016-08-16 LAB — POCT FERN TEST: POCT Fern Test: POSITIVE

## 2016-08-16 MED ORDER — PHENYLEPHRINE 8 MG IN D5W 100 ML (0.08MG/ML) PREMIX OPTIME
INJECTION | INTRAVENOUS | Status: AC
  Filled 2016-08-16: qty 100

## 2016-08-16 MED ORDER — OXYTOCIN 10 UNIT/ML IJ SOLN
INTRAMUSCULAR | Status: AC
Start: 2016-08-16 — End: 2016-08-16
  Filled 2016-08-16: qty 4

## 2016-08-16 MED ORDER — ONDANSETRON HCL 4 MG/2ML IJ SOLN
INTRAMUSCULAR | Status: AC
  Filled 2016-08-16: qty 2

## 2016-08-16 MED ORDER — ACETAMINOPHEN 160 MG/5ML PO SOLN
975.0000 mg | Freq: Once | ORAL | Status: AC
  Administered 2016-08-16: 975 mg via ORAL
  Filled 2016-08-16: qty 40

## 2016-08-16 MED ORDER — LACTATED RINGERS IV SOLN
INTRAVENOUS | Status: DC
  Administered 2016-08-16: 22:00:00 via INTRAVENOUS

## 2016-08-16 MED ORDER — FENTANYL CITRATE (PF) 100 MCG/2ML IJ SOLN
INTRAMUSCULAR | Status: AC
  Filled 2016-08-16: qty 2

## 2016-08-16 MED ORDER — MORPHINE SULFATE (PF) 0.5 MG/ML IJ SOLN
INTRAMUSCULAR | Status: AC
  Filled 2016-08-16: qty 10

## 2016-08-16 MED ORDER — DEXTROSE 5 % IV SOLN
3.0000 g | INTRAVENOUS | Status: AC
  Administered 2016-08-17: 3 g via INTRAVENOUS
  Filled 2016-08-16: qty 3000

## 2016-08-16 MED ORDER — LACTATED RINGERS IV BOLUS (SEPSIS): 1000.0000 mL | Freq: Once | INTRAVENOUS | Status: DC

## 2016-08-16 NOTE — MAU Note (Addendum)
Leaking fld since yesterday at lunch time. More fld today. Some cramping. Clear fld. Scheduled for repeat C/S this Weds 1/31

## 2016-08-16 NOTE — Anesthesia Preprocedure Evaluation (Addendum)
Anesthesia Evaluation  Patient identified by MRN, date of birth, ID band Patient awake    Reviewed: Allergy & Precautions, H&P , Patient's Chart, lab work & pertinent test results, reviewed documented beta blocker date and time   Airway Mallampati: III  TM Distance: >3 FB Neck ROM: full    Dental no notable dental hx.    Pulmonary sleep apnea ,    Pulmonary exam normal breath sounds clear to auscultation       Cardiovascular Exercise Tolerance: Good  Rhythm:regular Rate:Normal     Neuro/Psych    GI/Hepatic Medicated,  Endo/Other    Renal/GU      Musculoskeletal   Abdominal   Peds  Hematology  (+) anemia ,   Anesthesia Other Findings   Reproductive/Obstetrics                            Anesthesia Physical Anesthesia Plan  ASA: III  Anesthesia Plan: Spinal, Epidural and Combined Spinal and Epidural   Post-op Pain Management:    Induction:   Airway Management Planned:   Additional Equipment:   Intra-op Plan:   Post-operative Plan:   Informed Consent: I have reviewed the patients History and Physical, chart, labs and discussed the procedure including the risks, benefits and alternatives for the proposed anesthesia with the patient or authorized representative who has indicated his/her understanding and acceptance.   Dental Advisory Given  Plan Discussed with: CRNA  Anesthesia Plan Comments: (Lab work and procedure  confirmed with CRNA in room; platelets okay. Discussed spinal anesthetic, and patient consents to the procedure:  included risk of possible headache,backache, failed block, allergic reaction, and nerve injury. This patient was asked if she had any questions or concerns before the procedure started.  )        Anesthesia Quick Evaluation

## 2016-08-16 NOTE — H&P (Signed)
Sierra Heath is a 26 y.o. female G2P1001 presenting at [redacted]w[redacted]d weeks IUP with report of rupture of membranes on 08/15/16 at 0630.  Reports feeling contractions that reported as "increased urge to urinate'.  Received prenatal care at [facility] with onset at [redacted] weeks gestation.  Pregnancy dated by 1st trimester ultrasound.  Pregnancy complicated by previous csection due to chorio and failed IOL.  Maternal history complicated by morbid obesity, sleep apnea, and anemia.  Pt Body mass index is 57.56 kg/m.  Last hemoglobin 7.6 on 08/13/16.  Reports eating last at 1700 today.  OB History    Gravida Para Term Preterm AB Living   2 1 1     1    SAB TAB Ectopic Multiple Live Births           1     Past Medical History:  Diagnosis Date  . Acid reflux   . Back pain, chronic   . BV (bacterial vaginosis) 09/11/2014  . Contraceptive management 09/11/2014  . Diarrhea 10/11/2014  . Hemorrhoids 10/03/2014  . History of cesarean section 12/25/2015  . History of chlamydia   . Migraine   . Nausea 10/11/2014  . Obesity   . Pregnant 12/25/2015  . Rectal fissure 10/03/2014  . Sleep apnea   . Vaginal discharge 09/11/2014   Past Surgical History:  Procedure Laterality Date  . CESAREAN SECTION    . HERNIA REPAIR     age 2  . TONSILLECTOMY    . WISDOM TOOTH EXTRACTION     Family History: family history includes Cancer in her paternal grandmother; Diabetes in her paternal grandmother; Hyperlipidemia in her mother; Hypertension in her mother. Social History:  reports that she has never smoked. She has never used smokeless tobacco. She reports that she does not drink alcohol or use drugs.     Maternal Diabetes: No Genetic Screening: Normal Maternal Ultrasounds/Referrals: Normal Fetal Ultrasounds or other Referrals:  None Maternal Substance Abuse:  No Significant Maternal Medications:  None Significant Maternal Lab Results:  Lab values include: Group B Strep negative Other Comments:  None  ROS Maternal  Medical History:  Reason for admission: Rupture of membranes.   Contractions: Onset was 3-5 hours ago.   Frequency: irregular.   Perceived severity is mild.    Fetal activity: Perceived fetal activity is normal.   Last perceived fetal movement was within the past hour.    Prenatal complications: no prenatal complications No bleeding, infection, polyhydramnios, pre-eclampsia, preterm labor or substance abuse.   Prenatal Complications - Diabetes: none.    Dilation: 1 Effacement (%): Thick Station: -3 Exam by:: cwicker,rnc Blood pressure 114/64, pulse 81, temperature 98.2 F (36.8 C), resp. rate 20, height 5\' 6"  (1.676 m), weight (!) 356 lb 9.6 oz (161.8 kg), last menstrual period 11/12/2015. Maternal Exam:  Uterine Assessment: Contraction strength is mild.  Contraction frequency is regular.   Abdomen: Patient reports no abdominal tenderness. Estimated fetal weight is 8.5-9lbs.    Introitus: Ferning test: positive.      Fetal Exam Fetal Monitor Review: Mode: ultrasound.   Baseline rate: 135.  Variability: moderate (6-25 bpm).   Pattern: accelerations present and no decelerations.    Fetal State Assessment: Category I - tracings are normal.     Physical Exam  Constitutional: She is oriented to person, place, and time. She appears well-developed and well-nourished.  HENT:  Head: Normocephalic.  Neck: Normal range of motion. Neck supple.  Cardiovascular: Normal rate, regular rhythm and normal heart sounds.  Respiratory: Effort normal and breath sounds normal. No respiratory distress.  GI: Soft. There is no tenderness.  Genitourinary: No bleeding in the vagina.  Musculoskeletal: Normal range of motion. She exhibits no edema.  Neurological: She is alert and oriented to person, place, and time.  Skin: Skin is warm and dry.    Prenatal labs: ABO, Rh: O/Positive/-- (06/29 1431) Antibody: Negative (11/07 0848) Rubella: <0.90 (06/29 1431) RPR: Non Reactive (11/07 0848)   HBsAg: Negative (06/29 1431)  HIV: Non Reactive (11/07 0848)  GBS: Negative (01/19 0000)   Assessment/Plan: 26 y.o. G2P1001 at 5425w5d IUP Premature Rupture of Membranes Morbid Obesity Anemia Hx of Previous C-Section  Plan: Report given to Dr. Jolayne Pantheronstant who plans to come and consent patient for surgery  Rochele PagesWalidah Karim 08/16/2016, 9:40 PM

## 2016-08-17 ENCOUNTER — Encounter (HOSPITAL_COMMUNITY)
Admission: AD | Disposition: A | Discharge status: Home / Self Care | Payer: Self-pay | Source: Ambulatory Visit | Attending: Obstetrics and Gynecology

## 2016-08-17 ENCOUNTER — Encounter (HOSPITAL_COMMUNITY): Payer: Self-pay | Admitting: General Practice

## 2016-08-17 ENCOUNTER — Inpatient Hospital Stay (HOSPITAL_COMMUNITY): Payer: Medicaid Other | Admitting: Anesthesiology

## 2016-08-17 DIAGNOSIS — O4292 Full-term premature rupture of membranes, unspecified as to length of time between rupture and onset of labor: Secondary | ICD-10-CM | POA: Diagnosis present

## 2016-08-17 DIAGNOSIS — Z3A38 38 weeks gestation of pregnancy: Secondary | ICD-10-CM

## 2016-08-17 DIAGNOSIS — Z98891 History of uterine scar from previous surgery: Secondary | ICD-10-CM

## 2016-08-17 DIAGNOSIS — O99214 Obesity complicating childbirth: Secondary | ICD-10-CM | POA: Diagnosis present

## 2016-08-17 DIAGNOSIS — O34211 Maternal care for low transverse scar from previous cesarean delivery: Secondary | ICD-10-CM | POA: Diagnosis present

## 2016-08-17 DIAGNOSIS — G4730 Sleep apnea, unspecified: Secondary | ICD-10-CM | POA: Diagnosis present

## 2016-08-17 DIAGNOSIS — Z8249 Family history of ischemic heart disease and other diseases of the circulatory system: Secondary | ICD-10-CM | POA: Diagnosis not present

## 2016-08-17 DIAGNOSIS — O9902 Anemia complicating childbirth: Secondary | ICD-10-CM | POA: Diagnosis present

## 2016-08-17 DIAGNOSIS — O99354 Diseases of the nervous system complicating childbirth: Secondary | ICD-10-CM | POA: Diagnosis present

## 2016-08-17 DIAGNOSIS — O9962 Diseases of the digestive system complicating childbirth: Secondary | ICD-10-CM | POA: Diagnosis present

## 2016-08-17 DIAGNOSIS — Z833 Family history of diabetes mellitus: Secondary | ICD-10-CM | POA: Diagnosis not present

## 2016-08-17 DIAGNOSIS — D649 Anemia, unspecified: Secondary | ICD-10-CM | POA: Diagnosis present

## 2016-08-17 DIAGNOSIS — Z6841 Body Mass Index (BMI) 40.0 and over, adult: Secondary | ICD-10-CM | POA: Diagnosis not present

## 2016-08-17 LAB — CBC
HEMATOCRIT: 22.1 % — AB (ref 36.0–46.0)
HEMOGLOBIN: 6.8 g/dL — AB (ref 12.0–15.0)
MCH: 21.1 pg — ABNORMAL LOW (ref 26.0–34.0)
MCHC: 30.8 g/dL (ref 30.0–36.0)
MCV: 68.4 fL — AB (ref 78.0–100.0)
Platelets: 304 10*3/uL (ref 150–400)
RBC: 3.23 MIL/uL — AB (ref 3.87–5.11)
RDW: 17.9 % — AB (ref 11.5–15.5)
WBC: 11.9 10*3/uL — AB (ref 4.0–10.5)

## 2016-08-17 LAB — PREPARE RBC (CROSSMATCH)

## 2016-08-17 LAB — RPR: RPR Ser Ql: NONREACTIVE

## 2016-08-17 SURGERY — Surgical Case
Anesthesia: Spinal | Wound class: Clean Contaminated

## 2016-08-17 MED ORDER — ONDANSETRON HCL 4 MG/2ML IJ SOLN
INTRAMUSCULAR | Status: DC | PRN
  Administered 2016-08-17: 4 mg via INTRAVENOUS

## 2016-08-17 MED ORDER — SIMETHICONE 80 MG PO CHEW
80.0000 mg | CHEWABLE_TABLET | ORAL | Status: DC | PRN
  Administered 2016-08-17: 80 mg via ORAL
  Filled 2016-08-17: qty 1

## 2016-08-17 MED ORDER — SODIUM CHLORIDE 0.9% FLUSH: 3.0000 mL | INTRAVENOUS | Status: DC | PRN

## 2016-08-17 MED ORDER — COCONUT OIL OIL
1.0000 | TOPICAL_OIL | Status: DC | PRN
Start: 2016-08-17 — End: 2016-08-18

## 2016-08-17 MED ORDER — SODIUM CHLORIDE 0.9 % IV SOLN: Freq: Once | INTRAVENOUS | Status: DC

## 2016-08-17 MED ORDER — NALBUPHINE HCL 10 MG/ML IJ SOLN: 5.0000 mg | Freq: Once | INTRAMUSCULAR | Status: DC | PRN

## 2016-08-17 MED ORDER — FENTANYL CITRATE (PF) 100 MCG/2ML IJ SOLN: 25.0000 ug | INTRAMUSCULAR | Status: DC | PRN

## 2016-08-17 MED ORDER — NALBUPHINE HCL 10 MG/ML IJ SOLN: 5.0000 mg | INTRAMUSCULAR | Status: DC | PRN

## 2016-08-17 MED ORDER — SCOPOLAMINE 1 MG/3DAYS TD PT72
1.0000 | MEDICATED_PATCH | Freq: Once | TRANSDERMAL | Status: DC
  Filled 2016-08-17: qty 1

## 2016-08-17 MED ORDER — ACETAMINOPHEN 325 MG PO TABS
650.0000 mg | ORAL_TABLET | ORAL | Status: DC | PRN
  Administered 2016-08-18: 650 mg via ORAL
  Filled 2016-08-17: qty 2

## 2016-08-17 MED ORDER — PRENATAL MULTIVITAMIN CH
1.0000 | ORAL_TABLET | Freq: Every day | ORAL | Status: DC
  Administered 2016-08-17 – 2016-08-18 (×2): 1 via ORAL
  Filled 2016-08-17 (×2): qty 1

## 2016-08-17 MED ORDER — OXYTOCIN 40 UNITS IN LACTATED RINGERS INFUSION - SIMPLE MED: 2.5000 [IU]/h | INTRAVENOUS | Status: AC

## 2016-08-17 MED ORDER — PANTOPRAZOLE SODIUM 40 MG PO TBEC
40.0000 mg | DELAYED_RELEASE_TABLET | Freq: Every day | ORAL | Status: DC
  Administered 2016-08-17 – 2016-08-18 (×2): 40 mg via ORAL
  Filled 2016-08-17 (×2): qty 1

## 2016-08-17 MED ORDER — DIPHENHYDRAMINE HCL 50 MG/ML IJ SOLN: 12.5000 mg | INTRAMUSCULAR | Status: DC | PRN

## 2016-08-17 MED ORDER — OXYCODONE HCL 5 MG PO TABS
5.0000 mg | ORAL_TABLET | ORAL | Status: DC | PRN
  Administered 2016-08-17: 5 mg via ORAL
  Filled 2016-08-17: qty 1

## 2016-08-17 MED ORDER — PHENYLEPHRINE 40 MCG/ML (10ML) SYRINGE FOR IV PUSH (FOR BLOOD PRESSURE SUPPORT)
PREFILLED_SYRINGE | INTRAVENOUS | Status: DC | PRN
  Administered 2016-08-17: 80 ug via INTRAVENOUS

## 2016-08-17 MED ORDER — NALBUPHINE HCL 10 MG/ML IJ SOLN
5.0000 mg | INTRAMUSCULAR | Status: DC | PRN
  Administered 2016-08-17: 5 mg via SUBCUTANEOUS
  Filled 2016-08-17: qty 1

## 2016-08-17 MED ORDER — DIPHENHYDRAMINE HCL 25 MG PO CAPS: 25.0000 mg | ORAL_CAPSULE | Freq: Four times a day (QID) | ORAL | Status: DC | PRN

## 2016-08-17 MED ORDER — PHENYLEPHRINE 40 MCG/ML (10ML) SYRINGE FOR IV PUSH (FOR BLOOD PRESSURE SUPPORT)
PREFILLED_SYRINGE | INTRAVENOUS | Status: AC
  Filled 2016-08-17: qty 10

## 2016-08-17 MED ORDER — LACTATED RINGERS IV SOLN
INTRAVENOUS | Status: DC | PRN
  Administered 2016-08-17 (×3): via INTRAVENOUS

## 2016-08-17 MED ORDER — OXYCODONE HCL 5 MG PO TABS
10.0000 mg | ORAL_TABLET | ORAL | Status: DC | PRN
  Administered 2016-08-18 (×2): 10 mg via ORAL
  Filled 2016-08-17 (×2): qty 2

## 2016-08-17 MED ORDER — SIMETHICONE 80 MG PO CHEW
80.0000 mg | CHEWABLE_TABLET | ORAL | Status: DC
  Filled 2016-08-17: qty 1

## 2016-08-17 MED ORDER — OXYTOCIN 10 UNIT/ML IJ SOLN
INTRAVENOUS | Status: DC | PRN
  Administered 2016-08-17: 40 [IU] via INTRAVENOUS

## 2016-08-17 MED ORDER — SCOPOLAMINE 1 MG/3DAYS TD PT72
MEDICATED_PATCH | TRANSDERMAL | Status: DC | PRN
  Administered 2016-08-17: 1 via TRANSDERMAL

## 2016-08-17 MED ORDER — SODIUM CHLORIDE 0.9 % IR SOLN
Status: DC | PRN
  Administered 2016-08-17: 1000 mL

## 2016-08-17 MED ORDER — NALOXONE HCL 0.4 MG/ML IJ SOLN: 0.4000 mg | INTRAMUSCULAR | Status: DC | PRN

## 2016-08-17 MED ORDER — DIBUCAINE 1 % RE OINT: 1.0000 "application " | TOPICAL_OINTMENT | RECTAL | Status: DC | PRN

## 2016-08-17 MED ORDER — BUPIVACAINE IN DEXTROSE 0.75-8.25 % IT SOLN
INTRATHECAL | Status: AC
  Filled 2016-08-17: qty 2

## 2016-08-17 MED ORDER — MEPERIDINE HCL 25 MG/ML IJ SOLN: 6.2500 mg | INTRAMUSCULAR | Status: DC | PRN

## 2016-08-17 MED ORDER — LACTATED RINGERS IV SOLN
INTRAVENOUS | Status: DC
  Administered 2016-08-17 (×2): via INTRAVENOUS

## 2016-08-17 MED ORDER — TETANUS-DIPHTH-ACELL PERTUSSIS 5-2.5-18.5 LF-MCG/0.5 IM SUSP: 0.5000 mL | Freq: Once | INTRAMUSCULAR | Status: DC

## 2016-08-17 MED ORDER — SCOPOLAMINE 1 MG/3DAYS TD PT72
MEDICATED_PATCH | TRANSDERMAL | Status: AC
  Filled 2016-08-17: qty 1

## 2016-08-17 MED ORDER — SIMETHICONE 80 MG PO CHEW
80.0000 mg | CHEWABLE_TABLET | Freq: Three times a day (TID) | ORAL | Status: DC
  Administered 2016-08-17 – 2016-08-18 (×6): 80 mg via ORAL
  Filled 2016-08-17 (×6): qty 1

## 2016-08-17 MED ORDER — WITCH HAZEL-GLYCERIN EX PADS: 1.0000 "application " | MEDICATED_PAD | CUTANEOUS | Status: DC | PRN

## 2016-08-17 MED ORDER — IBUPROFEN 600 MG PO TABS
600.0000 mg | ORAL_TABLET | Freq: Four times a day (QID) | ORAL | Status: DC
  Administered 2016-08-17 – 2016-08-18 (×7): 600 mg via ORAL
  Filled 2016-08-17 (×7): qty 1

## 2016-08-17 MED ORDER — NALOXONE HCL 2 MG/2ML IJ SOSY
1.0000 ug/kg/h | PREFILLED_SYRINGE | INTRAVENOUS | Status: DC | PRN
  Filled 2016-08-17: qty 2

## 2016-08-17 MED ORDER — PHENYLEPHRINE 8 MG IN D5W 100 ML (0.08MG/ML) PREMIX OPTIME
INJECTION | INTRAVENOUS | Status: DC | PRN
  Administered 2016-08-17: 50 ug/min via INTRAVENOUS

## 2016-08-17 MED ORDER — FAMOTIDINE IN NACL 20-0.9 MG/50ML-% IV SOLN
INTRAVENOUS | Status: AC
  Administered 2016-08-17: 20 mg
  Filled 2016-08-17: qty 50

## 2016-08-17 MED ORDER — MENTHOL 3 MG MT LOZG: 1.0000 | LOZENGE | OROMUCOSAL | Status: DC | PRN

## 2016-08-17 MED ORDER — DIPHENHYDRAMINE HCL 25 MG PO CAPS: 25.0000 mg | ORAL_CAPSULE | ORAL | Status: DC | PRN

## 2016-08-17 MED ORDER — SOD CITRATE-CITRIC ACID 500-334 MG/5ML PO SOLN
ORAL | Status: AC
  Administered 2016-08-17: 30 mL
  Filled 2016-08-17: qty 15

## 2016-08-17 MED ORDER — ACETAMINOPHEN 500 MG PO TABS
1000.0000 mg | ORAL_TABLET | Freq: Four times a day (QID) | ORAL | Status: AC
  Administered 2016-08-17 (×4): 1000 mg via ORAL
  Filled 2016-08-17 (×4): qty 2

## 2016-08-17 MED ORDER — ONDANSETRON HCL 4 MG/2ML IJ SOLN: 4.0000 mg | Freq: Three times a day (TID) | INTRAMUSCULAR | Status: DC | PRN

## 2016-08-17 MED ORDER — SENNOSIDES-DOCUSATE SODIUM 8.6-50 MG PO TABS
2.0000 | ORAL_TABLET | ORAL | Status: DC
  Administered 2016-08-17: 2 via ORAL
  Filled 2016-08-17: qty 2

## 2016-08-17 SURGICAL SUPPLY — 31 items
BRR ADH 6X5 SEPRAFILM 1 SHT (MISCELLANEOUS)
CHLORAPREP W/TINT 26ML (MISCELLANEOUS) ×3 IMPLANT
CLAMP CORD UMBIL (MISCELLANEOUS) IMPLANT
CONTAINER PREFILL 10% NBF 15ML (MISCELLANEOUS) IMPLANT
DRESSING DISP NPWT PICO 4X12 (MISCELLANEOUS) ×2 IMPLANT
DRSG OPSITE POSTOP 4X10 (GAUZE/BANDAGES/DRESSINGS) ×3 IMPLANT
ELECT REM PT RETURN 9FT ADLT (ELECTROSURGICAL) ×3
ELECTRODE REM PT RTRN 9FT ADLT (ELECTROSURGICAL) ×1 IMPLANT
EXTRACTOR VACUUM M CUP 4 TUBE (SUCTIONS) IMPLANT
EXTRACTOR VACUUM M CUP 4' TUBE (SUCTIONS)
GLOVE BIOGEL PI IND STRL 6.5 (GLOVE) ×1 IMPLANT
GLOVE BIOGEL PI IND STRL 7.0 (GLOVE) ×1 IMPLANT
GLOVE BIOGEL PI INDICATOR 6.5 (GLOVE) ×2
GLOVE BIOGEL PI INDICATOR 7.0 (GLOVE) ×2
GLOVE SURG SS PI 6.0 STRL IVOR (GLOVE) ×3 IMPLANT
GOWN STRL REUS W/TWL LRG LVL3 (GOWN DISPOSABLE) ×6 IMPLANT
KIT ABG SYR 3ML LUER SLIP (SYRINGE) IMPLANT
NDL HYPO 25X5/8 SAFETYGLIDE (NEEDLE) IMPLANT
NEEDLE HYPO 25X5/8 SAFETYGLIDE (NEEDLE) IMPLANT
NS IRRIG 1000ML POUR BTL (IV SOLUTION) ×3 IMPLANT
PACK C SECTION WH (CUSTOM PROCEDURE TRAY) ×3 IMPLANT
PAD OB MATERNITY 4.3X12.25 (PERSONAL CARE ITEMS) ×3 IMPLANT
PENCIL SMOKE EVAC W/HOLSTER (ELECTROSURGICAL) ×3 IMPLANT
RTRCTR C-SECT PINK 25CM LRG (MISCELLANEOUS) IMPLANT
SEPRAFILM MEMBRANE 5X6 (MISCELLANEOUS) IMPLANT
SUT PLAIN 0 NONE (SUTURE) IMPLANT
SUT PLAIN 2 0 XLH (SUTURE) ×2 IMPLANT
SUT VIC AB 0 CT1 36 (SUTURE) ×12 IMPLANT
SUT VIC AB 4-0 KS 27 (SUTURE) ×3 IMPLANT
TOWEL OR 17X24 6PK STRL BLUE (TOWEL DISPOSABLE) ×3 IMPLANT
TRAY FOLEY CATH SILVER 14FR (SET/KITS/TRAYS/PACK) ×3 IMPLANT

## 2016-08-17 NOTE — Anesthesia Postprocedure Evaluation (Signed)
Anesthesia Post Note  Patient: Sierra Heath  Procedure(s) Performed: Procedure(s) (LRB): CESAREAN SECTION (N/A)  Patient location during evaluation: PACU Anesthesia Type: Spinal Level of consciousness: awake Pain management: satisfactory to patient Vital Signs Assessment: post-procedure vital signs reviewed and stable Respiratory status: spontaneous breathing Cardiovascular status: blood pressure returned to baseline Postop Assessment: no headache and spinal receding Anesthetic complications: no       Last Vitals:  Vitals:   08/17/16 0515 08/17/16 0615  BP: (!) 118/58 (!) 104/47  Pulse: 92 92  Resp: 20 18  Temp: 37.3 C 37.2 C    Last Pain:  Vitals:   08/17/16 0610  TempSrc:   PainSc: 4                  Jahmarion Popoff EDWARD

## 2016-08-17 NOTE — Progress Notes (Signed)
CRITICAL VALUE ALERT  Critical value received:Hg 6.8  Date of notification: 08/17/2016  Time of notification: 1730  Critical value read back: yes  Nurse who received alert:  Jamesetta OrleansBrook Shavaughn Seidl RN  MD notified (1st page):    Time of first page  MD notified (2nd page):  Time of second page:  Responding MD:  Zerita Boersarlene Lawson CNM  Time MD responded:  [phone]

## 2016-08-17 NOTE — Anesthesia Procedure Notes (Signed)
Epidural Patient location during procedure: OR Start time: 08/17/2016 1:14 AM End time: 08/17/2016 1:36 AM  Staffing Anesthesiologist: Cristela BlueJACKSON, Naliya Gish  Preanesthetic Checklist Completed: patient identified, site marked, surgical consent, pre-op evaluation, timeout performed, IV checked, risks and benefits discussed and monitors and equipment checked  Epidural Patient position: sitting Prep: site prepped and draped and DuraPrep Patient monitoring: continuous pulse ox and blood pressure Approach: midline Location: L3-L4 Injection technique: LOR air  Needle:  Needle type: Tuohy  Needle gauge: 17 G Needle length: 15 cm and 9 Needle insertion depth: 11 cm Catheter type: closed end flexible Catheter size: 19 Gauge Catheter at skin depth: 19 cm Test dose: negative  Assessment Events: blood not aspirated, injection not painful, no injection resistance, negative IV test and no paresthesia  Additional Notes 200mm 24 GA sprotte thru tuouhy to Clear CSF Dosed easily Spinal Dosage in OR  Bupivicaine ml       1.6 PFMS04   mcg        100 Fentanyl mcg            25  Catheter passed easily (-) asp Heme/CSF

## 2016-08-17 NOTE — Op Note (Signed)
Sierra Heath PROCEDURE DATE: 08/16/2016 - 08/17/2016  PREOPERATIVE DIAGNOSIS: Intrauterine pregnancy at  3642w6d weeks gestation; patient declines vag del attempt  POSTOPERATIVE DIAGNOSIS: The same  PROCEDURE:     Cesarean Section  SURGEON:  Dr. Catalina AntiguaPeggy Autym Siess  ASSISTANT: none  INDICATIONS: Sierra Heath is a 26 y.o. [id number] at 4542w6d scheduled for cesarean section secondary to patient declines vag del attempt.  The risks of cesarean section discussed with the patient included but were not limited to: bleeding which may require transfusion or reoperation; infection which may require antibiotics; injury to bowel, bladder, ureters or other surrounding organs; injury to the fetus; need for additional procedures including hysterectomy in the event of a life-threatening hemorrhage; placental abnormalities wth subsequent pregnancies, incisional problems, thromboembolic phenomenon and other postoperative/anesthesia complications. The patient concurred with the proposed plan, giving informed written consent for the procedure.    FINDINGS:  Viable female infant in cephalic presentation, vacuum assisted. A vacuum was used to assist with fetal head delivery due to maternal body habitus.  Apgars 9 and 9.  Clear amniotic fluid.  Intact placenta, three vessel cord.  Normal uterus, fallopian tubes and ovaries bilaterally.  ANESTHESIA:    Spinal INTRAVENOUS FLUIDS:2500 ml ESTIMATED BLOOD LOSS: 700 ml URINE OUTPUT:  100 ml SPECIMENS: Placenta sent to L&D COMPLICATIONS: None immediate  PROCEDURE IN DETAIL:  The patient received intravenous antibiotics and had sequential compression devices applied to her lower extremities while in the preoperative area.  She was then taken to the operating room where anesthesia was induced and was found to be adequate. A foley catheter was placed into her bladder and attached to Brandis Wixted gravity. She was then placed in a dorsal supine position with a leftward tilt, and  prepped and draped in a sterile manner. After an adequate timeout was performed, a Pfannenstiel skin incision was made with scalpel and carried through to the underlying layer of fascia. The fascia was incised in the midline and this incision was extended bilaterally using the Mayo scissors. Kocher clamps were applied to the superior aspect of the fascial incision and the underlying rectus muscles were dissected off bluntly. A similar process was carried out on the inferior aspect of the facial incision. The rectus muscles were separated in the midline bluntly and the peritoneum was entered bluntly. The Alexis self-retaining retractor was introduced into the abdominal cavity. Attention was turned to the lower uterine segment where a transverse hysterotomy was made with a scalpel and extended bilaterally bluntly. The infant was successfully delivered, and cord was clamped and cut and infant was handed over to awaiting neonatology team. Uterine massage was then administered and the placenta delivered intact with three-vessel cord. The uterus was cleared of clot and debris.  The hysterotomy was closed with 0 Vicryl in a running locked fashion, and an imbricating layer was also placed with a 0 Vicryl. Overall, excellent hemostasis was noted. The pelvis copiously irrigated and cleared of all clot and debris. Hemostasis was confirmed on all surfaces.  The peritoneum and the muscles were reapproximated using 0 vicryl interrupted stitches. The fascia was then closed using 0 Vicryl in a running fashion.  The subcutaneous layer was reapproximated with plain gut and the skin was closed in a subcuticular fashion using 3.0 Vicryl. A PICO wound vac dressing was applied over the incision. The patient tolerated the procedure well. Sponge, lap, instrument and needle counts were correct x 2. She was taken to the recovery room in stable condition.    Sierra Heath  ConstantMD  08/17/2016 2:37 AM

## 2016-08-17 NOTE — Anesthesia Postprocedure Evaluation (Signed)
Anesthesia Post Note  Patient: Sierra Heath  Procedure(s) Performed: Procedure(s) (LRB): CESAREAN SECTION (N/A)  Patient location during evaluation: Mother Baby Anesthesia Type: Spinal Level of consciousness: awake and alert, oriented and patient cooperative Pain management: pain level controlled Vital Signs Assessment: post-procedure vital signs reviewed and stable Respiratory status: spontaneous breathing Cardiovascular status: stable Postop Assessment: no headache, patient able to bend at knees, no signs of nausea or vomiting and spinal receding Anesthetic complications: no Comments: Pain score 4.  Pt stated waiting on RN to help her get up and change position.        Last Vitals:  Vitals:   08/17/16 0515 08/17/16 0615  BP: (!) 118/58 (!) 104/47  Pulse: 92 92  Resp: 20 18  Temp: 37.3 C 37.2 C    Last Pain:  Vitals:   08/17/16 0610  TempSrc:   PainSc: 4    Pain Goal: Patients Stated Pain Goal: 0 (08/16/16 2038)               Merrilyn PumaWRINKLE,Maleke Feria

## 2016-08-17 NOTE — Transfer of Care (Signed)
Immediate Anesthesia Transfer of Care Note  Patient: Sierra Heath  Procedure(s) Performed: Procedure(s) with comments: CESAREAN SECTION (N/A) - repeat  Patient Location: PACU  Anesthesia Type:Spinal and Epidural  Level of Consciousness: awake  Airway & Oxygen Therapy: Patient Spontanous Breathing  Post-op Assessment: Report given to RN and Post -op Vital signs reviewed and stable  Post vital signs: stable  Last Vitals:  Vitals:   08/16/16 2039 08/16/16 2228  BP: 114/64 (!) 126/51  Pulse:  105  Resp:  18  Temp:  36.8 C    Last Pain:  Vitals:   08/16/16 2228  TempSrc: Oral  PainSc:       Patients Stated Pain Goal: 0 (08/16/16 2038)  Complications: No apparent anesthesia complications

## 2016-08-18 LAB — CBC WITH DIFFERENTIAL/PLATELET
BASOS ABS: 0 10*3/uL (ref 0.0–0.1)
BASOS PCT: 0 %
EOS ABS: 0.1 10*3/uL (ref 0.0–0.7)
EOS PCT: 0 %
HCT: 25.5 % — ABNORMAL LOW (ref 36.0–46.0)
Hemoglobin: 8 g/dL — ABNORMAL LOW (ref 12.0–15.0)
Lymphocytes Relative: 23 %
Lymphs Abs: 3.1 10*3/uL (ref 0.7–4.0)
MCH: 22.3 pg — ABNORMAL LOW (ref 26.0–34.0)
MCHC: 31.4 g/dL (ref 30.0–36.0)
MCV: 71 fL — ABNORMAL LOW (ref 78.0–100.0)
MONO ABS: 0.5 10*3/uL (ref 0.1–1.0)
Monocytes Relative: 4 %
Neutro Abs: 9.9 10*3/uL — ABNORMAL HIGH (ref 1.7–7.7)
Neutrophils Relative %: 73 %
PLATELETS: 301 10*3/uL (ref 150–400)
RBC: 3.59 MIL/uL — ABNORMAL LOW (ref 3.87–5.11)
RDW: 19.5 % — AB (ref 11.5–15.5)
WBC: 13.5 10*3/uL — ABNORMAL HIGH (ref 4.0–10.5)

## 2016-08-18 LAB — TYPE AND SCREEN
BLOOD PRODUCT EXPIRATION DATE: 201802262359
BLOOD PRODUCT EXPIRATION DATE: 201802262359
Blood Product Expiration Date: 201802072359
Blood Product Expiration Date: 201802262359
Blood Product Expiration Date: 201803022359
ISSUE DATE / TIME: 201801281238
ISSUE DATE / TIME: 201801281842
ISSUE DATE / TIME: 201801282052
UNIT TYPE AND RH: 5100
UNIT TYPE AND RH: 5100
Unit Type and Rh: 5100
Unit Type and Rh: 5100
Unit Type and Rh: 5100

## 2016-08-18 LAB — BIRTH TISSUE RECOVERY COLLECTION (PLACENTA DONATION)

## 2016-08-18 MED ORDER — IBUPROFEN 600 MG PO TABS: 600.0000 mg | ORAL_TABLET | Freq: Four times a day (QID) | ORAL | 0 refills | Status: DC

## 2016-08-18 MED ORDER — OXYCODONE HCL 5 MG PO TABS: 5.0000 mg | ORAL_TABLET | ORAL | 0 refills | Status: DC | PRN

## 2016-08-18 MED ORDER — MEASLES, MUMPS & RUBELLA VAC ~~LOC~~ INJ
0.5000 mL | INJECTION | Freq: Once | SUBCUTANEOUS | Status: AC
  Administered 2016-08-18: 0.5 mL via SUBCUTANEOUS
  Filled 2016-08-18: qty 0.5

## 2016-08-18 MED ORDER — FERROUS SULFATE 325 (65 FE) MG PO TABS: 325.0000 mg | ORAL_TABLET | Freq: Two times a day (BID) | ORAL | 3 refills | Status: DC

## 2016-08-18 MED ORDER — PRENATAL MULTIVITAMIN CH: 1.0000 | ORAL_TABLET | Freq: Every day | ORAL | 11 refills | Status: DC

## 2016-08-18 MED ORDER — FERROUS SULFATE 325 (65 FE) MG PO TABS: 325.0000 mg | ORAL_TABLET | Freq: Two times a day (BID) | ORAL | 1 refills | Status: DC

## 2016-08-18 NOTE — Progress Notes (Signed)
Baby to stay overnight due to watching jaundice levels. Sierra BoersDarlene Heath stated this morning that discharge could be cancelled if baby could not go. Called Sierra BourgeoisMarie Heath who ordered to cancel discharge now that we know for sure that baby will stay a patient. Earl Galasborne, Linda HedgesStefanie West LineHudspeth

## 2016-08-18 NOTE — Discharge Summary (Signed)
OB Discharge Summary  Patient Name: Sierra Heath DOB: Apr 30, 1991 MRN: [id number]  Date of admission: 08/16/2016 Delivering MD: Catalina Antigua   Date of discharge: 08/18/2016  Admitting diagnosis: 39 WKS, LEAKING Intrauterine pregnancy: [redacted]w[redacted]d     Secondary diagnosis:Active Problems:   S/P cesarean section  Additional problems:anemia     Discharge diagnosis: Term Pregnancy Delivered and Anemia                                                                     Post partum procedures:blood transfusion  Augmentation: n/a  Complications: None  Hospital course:  Sceduled C/S   26 y.o. yo G2P2002 at [redacted]w[redacted]d was admitted to the hospital 08/16/2016 for scheduled cesarean section with the following indication:Elective Repeat.  Membrane Rupture Time/Date: 6:00 AM ,08/15/2016   Patient delivered a Viable infant.08/17/2016  Details of operation can be found in separate operative note.  Pateint had an uncomplicated postpartum course.  She is ambulating, tolerating a regular diet, passing flatus, and urinating well. Patient is discharged home in stable condition on  08/18/16         Physical exam  Vitals:   08/17/16 2113 08/17/16 2128 08/17/16 2322 08/18/16 0356  BP: (!) 107/47 (!) 110/53 (!) 112/44 (!) 112/48  Pulse: 78 75 66 79  Resp: 16 17 16 17   Temp: 98.3 F (36.8 C) 97.8 F (36.6 C) 98.4 F (36.9 C) 98.3 F (36.8 C)  TempSrc: Oral Oral Oral Oral  SpO2:  97% 99%   Weight:      Height:       General: alert, cooperative and no distress Lochia: appropriate Uterine Fundus: firm Incision: Healing well with no significant drainage, No significant erythema, Dressing is clean, dry, and intact DVT Evaluation: No evidence of DVT seen on physical exam. Labs: Lab Results  Component Value Date   WBC 11.9 (H) 08/17/2016   HGB 6.8 (LL) 08/17/2016   HCT 22.1 (L) 08/17/2016   MCV 68.4 (L) 08/17/2016   PLT 304 08/17/2016   CMP Latest Ref Rng & Units 08/21/2010  Glucose 70 - 99  mg/dL 782(N)  BUN 6 - 23 mg/dL 3(L)  Creatinine 0.4 - 1.2 mg/dL 5.62  Sodium 130 - 865 mEq/L 137  Potassium 3.5 - 5.1 mEq/L 3.8  Chloride 96 - 112 mEq/L 107  CO2 19 - 32 mEq/L 22  Calcium 8.4 - 10.5 mg/dL 8.6    Discharge instruction: per After Visit Summary and "Baby and Me Booklet".  After Visit Meds:  Allergies as of 08/18/2016      Reactions   Hydrocodone Hives      Medication List    TAKE these medications   ferrous sulfate 325 (65 FE) MG tablet Take 1 tablet (325 mg total) by mouth 2 (two) times daily with a meal.   ibuprofen 600 MG tablet Commonly known as:  ADVIL,MOTRIN Take 1 tablet (600 mg total) by mouth every 6 (six) hours.   omeprazole 20 MG capsule Commonly known as:  PRILOSEC Take 20 mg by mouth 2 (two) times daily before a meal.   oxyCODONE 5 MG immediate release tablet Commonly known as:  Oxy IR/ROXICODONE Take 1 tablet (5 mg total) by mouth every 4 (four) hours  as needed (pain scale 4-7).   prenatal multivitamin Tabs tablet Take 1 tablet by mouth daily at 12 noon.       Diet: routine diet  Activity: Advance as tolerated. Pelvic rest for 6 weeks.   Outpatient follow up:1 week Follow up Appt:Future Appointments Date Time Provider Department Center  08/19/2016 9:45 AM [facility] PAT 5 [facility] None  08/27/2016 10:30 AM Cheral MarkerKimberly R Booker, CNM [facility]   Follow up visit: No Follow-up on file.  Postpartum contraception: Undecided  Newborn Data: Live born female  Birth Weight: 9 lb 1.3 oz (4120 g) APGAR: 8, 9  Baby Feeding: Bottle Disposition:home with mother   08/18/2016 Wyvonnia DuskyMarie Lawson, CNM

## 2016-08-18 NOTE — Plan of Care (Signed)
Problem: Tissue Perfusion: Goal: Risk factors for ineffective tissue perfusion will decrease Outcome: Completed/Met Date Met: 08/18/16 Patient has had SCD hose in place; however, removed after waking up this morning to ambulate in room. Patient has understanding to replace when back in bed.

## 2016-08-19 ENCOUNTER — Encounter (HOSPITAL_COMMUNITY)
Admission: RE | Admit: 2016-08-19 | Discharge: 2016-08-19 | Disposition: A | Discharge status: Home / Self Care | Payer: Medicaid Other | Source: Ambulatory Visit | Attending: Family Medicine | Admitting: Family Medicine

## 2016-08-19 ENCOUNTER — Telehealth: Payer: Self-pay | Admitting: Obstetrics & Gynecology

## 2016-08-19 NOTE — Telephone Encounter (Signed)
Pt called stating that she had a c-section and she is going to run out of her pain medication before her appointment on the 2/8. Pt would lie to know if she could get a  Refill before then.

## 2016-08-19 NOTE — Telephone Encounter (Signed)
Patient called with concerns about her wound vac to her incision. States she wants to know what to do when the battery is not working anymore. Informed patient to shower and pull dressing off and to keep her appointment to be seen for follow-up. Pt verbalized understanding.

## 2016-08-20 ENCOUNTER — Inpatient Hospital Stay (HOSPITAL_COMMUNITY)
Admission: RE | Admit: 2016-08-20 | Payer: Medicaid Other | Source: Ambulatory Visit | Admitting: Obstetrics & Gynecology

## 2016-08-20 ENCOUNTER — Encounter (HOSPITAL_COMMUNITY): Admission: RE | Payer: Self-pay | Source: Ambulatory Visit

## 2016-08-20 SURGERY — Surgical Case
Anesthesia: Regional

## 2016-08-27 ENCOUNTER — Encounter: Payer: Medicaid Other | Admitting: Women's Health

## 2016-08-28 ENCOUNTER — Ambulatory Visit (INDEPENDENT_AMBULATORY_CARE_PROVIDER_SITE_OTHER): Payer: Medicaid Other | Admitting: Obstetrics & Gynecology

## 2016-08-28 ENCOUNTER — Encounter: Payer: Self-pay | Admitting: Obstetrics & Gynecology

## 2016-08-28 VITALS — BP 104/64 | HR 72 | Wt 318.0 lb

## 2016-08-28 DIAGNOSIS — Z98891 History of uterine scar from previous surgery: Secondary | ICD-10-CM | POA: Diagnosis not present

## 2016-08-28 DIAGNOSIS — Z4889 Encounter for other specified surgical aftercare: Secondary | ICD-10-CM | POA: Diagnosis not present

## 2016-08-28 MED ORDER — OXYCODONE HCL 5 MG PO TABS: 5.0000 mg | ORAL_TABLET | ORAL | 0 refills | Status: DC | PRN

## 2016-08-28 NOTE — Progress Notes (Signed)
  HPI: Patient returns for routine postoperative follow-up having undergone repeat Caesarean section on 08/16/2016.  The patient's immediate postoperative recovery has been unremarkable. Since hospital discharge the patient reports no problems.   Current Outpatient Prescriptions: ibuprofen (ADVIL,MOTRIN) 600 MG tablet, Take 1 tablet (600 mg total) by mouth every 6 (six) hours., Disp: 30 tablet, Rfl: 0 omeprazole (PRILOSEC) 20 MG capsule, Take 20 mg by mouth 2 (two) times daily before a meal. , Disp: , Rfl:  ferrous sulfate 325 (65 FE) MG tablet, Take 1 tablet (325 mg total) by mouth 2 (two) times daily with a meal. (Patient not taking: Reported on 08/28/2016), Disp: 60 tablet, Rfl: 1 ferrous sulfate 325 (65 FE) MG tablet, Take 1 tablet (325 mg total) by mouth 2 (two) times daily with a meal. (Patient not taking: Reported on 08/28/2016), Disp: 60 tablet, Rfl: 3 oxyCODONE (OXY IR/ROXICODONE) 5 MG immediate release tablet, Take 1 tablet (5 mg total) by mouth every 4 (four) hours as needed (pain scale 4-7). (Patient not taking: Reported on 08/28/2016), Disp: 30 tablet, Rfl: 0 Prenatal Vit-Fe Fumarate-FA (PRENATAL MULTIVITAMIN) TABS tablet, Take 1 tablet by mouth daily at 12 noon. (Patient not taking: Reported on 08/28/2016), Disp: 30 tablet, Rfl: 11  No current facility-administered medications for this visit.     Blood pressure 104/64, pulse 72, weight (!) 318 lb (144.2 kg), not currently breastfeeding.  Physical Exam: Incision clean dry intact  Diagnostic Tests:   Pathology:   Impression: S/p repeat section  Plan: Meds ordered this encounter  Medications  . oxyCODONE (OXY IR/ROXICODONE) 5 MG immediate release tablet    Sig: Take 1 tablet (5 mg total) by mouth every 4 (four) hours as needed (pain scale 4-7).    Dispense:  30 tablet    Refill:  0     Follow up: 4  weeks  Lazaro ArmsEURE,LUTHER H, MD

## 2016-09-08 ENCOUNTER — Telehealth: Payer: Self-pay | Admitting: *Deleted

## 2016-09-08 NOTE — Telephone Encounter (Signed)
Patient called with complaints of left sided incision pain and is requesting a refill on ibuprofen. She would also like the oxycodone refilled if you will. Please advise.

## 2016-09-09 ENCOUNTER — Other Ambulatory Visit: Payer: Self-pay | Admitting: Obstetrics & Gynecology

## 2016-09-09 ENCOUNTER — Telehealth: Payer: Self-pay | Admitting: *Deleted

## 2016-09-09 MED ORDER — IBUPROFEN 600 MG PO TABS: 600.0000 mg | ORAL_TABLET | Freq: Four times a day (QID) | ORAL | 0 refills | Status: DC

## 2016-09-09 MED ORDER — OXYCODONE HCL 5 MG PO TABS: 5.0000 mg | ORAL_TABLET | ORAL | 0 refills | Status: DC | PRN

## 2016-09-09 NOTE — Telephone Encounter (Signed)
Patient called with complaints of left sided incision pain and is requesting a refill on ibuprofen. She would also like the oxycodone refilled if you will. Please advise.  

## 2016-09-11 ENCOUNTER — Ambulatory Visit (INDEPENDENT_AMBULATORY_CARE_PROVIDER_SITE_OTHER): Payer: Medicaid Other | Admitting: Family Medicine

## 2016-09-11 ENCOUNTER — Encounter: Payer: Self-pay | Admitting: Family Medicine

## 2016-09-11 VITALS — BP 113/61 | HR 85 | Temp 97.1°F | Ht 66.0 in | Wt 315.6 lb

## 2016-09-11 DIAGNOSIS — F419 Anxiety disorder, unspecified: Principal | ICD-10-CM

## 2016-09-11 DIAGNOSIS — F418 Other specified anxiety disorders: Secondary | ICD-10-CM

## 2016-09-11 DIAGNOSIS — K2100 Gastro-esophageal reflux disease with esophagitis, without bleeding: Secondary | ICD-10-CM

## 2016-09-11 DIAGNOSIS — G4733 Obstructive sleep apnea (adult) (pediatric): Secondary | ICD-10-CM | POA: Diagnosis not present

## 2016-09-11 DIAGNOSIS — G4730 Sleep apnea, unspecified: Secondary | ICD-10-CM | POA: Insufficient documentation

## 2016-09-11 DIAGNOSIS — F329 Major depressive disorder, single episode, unspecified: Secondary | ICD-10-CM

## 2016-09-11 MED ORDER — OMEPRAZOLE 20 MG PO CPDR: 20.0000 mg | DELAYED_RELEASE_CAPSULE | Freq: Two times a day (BID) | ORAL | 2 refills | Status: DC

## 2016-09-11 MED ORDER — SERTRALINE HCL 100 MG PO TABS: 100.0000 mg | ORAL_TABLET | Freq: Every day | ORAL | 1 refills | Status: DC

## 2016-09-11 NOTE — Progress Notes (Signed)
BP 113/61   Pulse 85   Temp 97.1 F (36.2 C) (Oral)   Ht 5\' 6"  (1.676 m)   Wt (!) 315 lb 9.6 oz (143.2 kg)   BMI 50.94 kg/m    Subjective:    Patient ID: Sierra MiresAllison N Heath, female    DOB: Oct 15, 1990, 26 y.o.   MRN: [id number]  HPI: Sierra Heath is a 26 y.o. female presenting on 09/11/2016 for Medication Refill (xanax & add meds); Gastroesophageal Reflux; and Establish Care (provider semi retired, can not see him till April so changing providers)   HPI Anxiety and depression Patient is coming in for anxiety and depression and possible ADHD. She has been on medications for both including Xanax and Adderall prior to her pregnancy. She just delivered her child 3 months ago and has been off all his medications before the pregnancy and since the pregnancy. She is coming in today because she has been having a lot more mood swings and anxiety and difficulty sleeping at night. She says she also has some focus and attention issues as well. She denies any suicidal ideations or thoughts of hurting herself. She is relatively happy with life and happy with her new child who is doing very well healthwise. She did have a C-section 3 weeks ago and everything is going well with that and she does have a follow-up with OB/GYN in 3 weeks. She has had difficulty with anxiety and focus since she was 15. She has been on medication since that time.  GERD Patient is coming in for fill on her GERD medication. She is currently on omeprazole 20 twice a day and has been on it for quite some time. She does say that it controls it very well and she denies any abdominal pain or heartburn or acid issues while she is taking it. She denies any blood in her stool. She has had an EGD  Sleep apnea Patient is coming in for sleep apnea to establish care. She currently uses her sleep machine very consistently and denies any issues with it.  Relevant past medical, surgical, family and social history reviewed and updated as  indicated. Interim medical history since our last visit reviewed. Allergies and medications reviewed and updated.  Review of Systems  Constitutional: Negative for chills and fever.  HENT: Negative for congestion, ear discharge and ear pain.   Respiratory: Negative for chest tightness and shortness of breath.   Cardiovascular: Negative for chest pain and leg swelling.  Gastrointestinal: Negative for abdominal distention, abdominal pain, blood in stool and constipation.  Musculoskeletal: Negative for back pain and gait problem.  Skin: Negative for rash.  Neurological: Negative for dizziness, light-headedness and headaches.  Psychiatric/Behavioral: Positive for decreased concentration, dysphoric mood and sleep disturbance. Negative for agitation and behavioral problems. The patient is nervous/anxious.   All other systems reviewed and are negative.   Per HPI unless specifically indicated above  Social History   Social History  . Marital status: Single    Spouse name: N/A  . Number of children: N/A  . Years of education: N/A   Occupational History  . Not on file.   Social History Main Topics  . Smoking status: Never Smoker  . Smokeless tobacco: Never Used  . Alcohol use No  . Drug use: Yes    Frequency: 1.0 time per week    Types: Marijuana     Comment: once per week  . Sexual activity: Not Currently   Other Topics Concern  . Not  on file   Social History Narrative  . No narrative on file    Past Surgical History:  Procedure Laterality Date  . CESAREAN SECTION    . CESAREAN SECTION N/A 08/17/2016   Procedure: CESAREAN SECTION;  Surgeon: Catalina Antigua, MD;  Location: [facility] BIRTHING SU[facility];  Service: Obstetrics;  Laterality: N/A;  repeat  . HERNIA REPAIR     age 88  . TONSILLECTOMY    . WISDOM TOOTH EXTRACTION      Family History  Problem Relation Age of Onset  . Hypertension Mother   . Hyperlipidemia Mother   . Cancer Paternal Grandmother   . Diabetes Paternal  Grandmother   . Colon cancer Neg Hx     Allergies as of 09/11/2016      Reactions   Hydrocodone Hives      Medication List       Accurate as of 09/11/16  9:59 AM. Always use your most recent med list.          amphetamine-dextroamphetamine 30 MG tablet Commonly known as:  ADDERALL Take 30 mg by mouth daily.   amphetamine-dextroamphetamine 30 MG tablet Commonly known as:  ADDERALL Take 30 mg by mouth daily.   amphetamine-dextroamphetamine 30 MG tablet Commonly known as:  ADDERALL Take 30 mg by mouth daily.   ibuprofen 600 MG tablet Commonly known as:  ADVIL,MOTRIN Take 1 tablet (600 mg total) by mouth every 6 (six) hours.   omeprazole 20 MG capsule Commonly known as:  PRILOSEC Take 1 capsule (20 mg total) by mouth 2 (two) times daily before a meal.   sertraline 100 MG tablet Commonly known as:  ZOLOFT Take 1 tablet (100 mg total) by mouth daily.          Objective:    BP 113/61   Pulse 85   Temp 97.1 F (36.2 C) (Oral)   Ht 5\' 6"  (1.676 m)   Wt (!) 315 lb 9.6 oz (143.2 kg)   BMI 50.94 kg/m   Wt Readings from Last 3 Encounters:  09/11/16 (!) 315 lb 9.6 oz (143.2 kg)  08/28/16 (!) 318 lb (144.2 kg)  08/12/16 (!) 352 lb (159.7 kg)    Physical Exam  Constitutional: She is oriented to person, place, and time. She appears well-developed and well-nourished. No distress.  Eyes: Conjunctivae are normal.  Neck: Neck supple. No thyromegaly present.  Cardiovascular: Normal rate, regular rhythm, normal heart sounds and intact distal pulses.   No murmur heard. Pulmonary/Chest: Effort normal and breath sounds normal. No respiratory distress. She has no wheezes. She has no rales.  Abdominal: Soft. Bowel sounds are normal. She exhibits no distension. There is no tenderness. There is no rebound.  Musculoskeletal: Normal range of motion. She exhibits no edema or tenderness.  Lymphadenopathy:    She has no cervical adenopathy.  Neurological: She is alert and oriented  to person, place, and time. Coordination normal.  Skin: Skin is warm and dry. No rash noted. She is not diaphoretic.  Psychiatric: Her behavior is normal. Judgment normal. Her mood appears anxious. She exhibits a depressed mood. She expresses no suicidal ideation. She expresses no suicidal plans.  Nursing note and vitals reviewed.     Assessment & Plan:   Problem List Items Addressed This Visit      Respiratory   Sleep apnea    Patient has had sleep apnea since being on child and she has had difficulty with being severely overweight since she was young child and she does use  her CPAP machine consistently       Other Visit Diagnoses    Anxiety and depression    -  Primary   Relevant Medications   sertraline (ZOLOFT) 100 MG tablet   Other Relevant Orders   Ambulatory referral to Psychiatry   Gastroesophageal reflux disease with esophagitis       Relevant Medications   omeprazole (PRILOSEC) 20 MG capsule       Follow up plan: Return in about 4 weeks (around 10/09/2016), or if symptoms worsen or fail to improve, for anxiety and depression.  Arville Care, MD [facility] 09/11/2016, 9:59 AM

## 2016-09-11 NOTE — Assessment & Plan Note (Signed)
Patient has had sleep apnea since being on child and she has had difficulty with being severely overweight since she was young child and she does use her CPAP machine consistently

## 2016-09-25 ENCOUNTER — Ambulatory Visit: Payer: Medicaid Other | Admitting: Obstetrics & Gynecology

## 2016-09-30 ENCOUNTER — Ambulatory Visit: Payer: Medicaid Other | Admitting: Adult Health

## 2016-10-08 ENCOUNTER — Telehealth (HOSPITAL_COMMUNITY): Payer: Self-pay | Admitting: *Deleted

## 2016-10-08 NOTE — Telephone Encounter (Signed)
[facility] pt [facility] out[facility] tms to sch new pt appt. Unable to resch pt and lmtcb. Office number was provided on voicemail.

## 2016-10-09 ENCOUNTER — Telehealth: Payer: Self-pay | Admitting: Family Medicine

## 2016-10-09 ENCOUNTER — Ambulatory Visit: Payer: Medicaid Other | Admitting: Family Medicine

## 2016-10-09 ENCOUNTER — Telehealth (HOSPITAL_COMMUNITY): Payer: Self-pay | Admitting: *Deleted

## 2016-10-09 NOTE — Telephone Encounter (Signed)
SPOKE WITH PATIENT, SHE SAID SHE DON'T NEED AN APPOINTMENT.

## 2016-10-10 NOTE — Telephone Encounter (Signed)
Mental Health records in chart.  Pt notified

## 2016-10-13 ENCOUNTER — Ambulatory Visit (INDEPENDENT_AMBULATORY_CARE_PROVIDER_SITE_OTHER): Payer: Medicaid Other | Admitting: Family Medicine

## 2016-10-13 ENCOUNTER — Encounter: Payer: Self-pay | Admitting: Family Medicine

## 2016-10-13 VITALS — BP 108/67 | HR 82 | Temp 97.4°F | Ht 66.0 in | Wt 313.0 lb

## 2016-10-13 DIAGNOSIS — F419 Anxiety disorder, unspecified: Principal | ICD-10-CM

## 2016-10-13 DIAGNOSIS — F418 Other specified anxiety disorders: Secondary | ICD-10-CM

## 2016-10-13 DIAGNOSIS — F339 Major depressive disorder, recurrent, unspecified: Secondary | ICD-10-CM | POA: Insufficient documentation

## 2016-10-13 DIAGNOSIS — F329 Major depressive disorder, single episode, unspecified: Secondary | ICD-10-CM

## 2016-10-13 MED ORDER — DULOXETINE HCL 30 MG PO CPEP: 30.0000 mg | ORAL_CAPSULE | Freq: Every day | ORAL | 1 refills | Status: DC

## 2016-10-13 NOTE — Progress Notes (Signed)
BP 108/67   Pulse 82   Temp 97.4 F (36.3 C) (Oral)   Ht 5\' 6"  (1.676 m)   Wt (!) 313 lb (142 kg)   BMI 50.52 kg/m    Subjective:    Patient ID: Sierra MiresAllison N Heath, female    DOB: [birth year], 26 y.o.   MRN: [id number]  HPI: Sierra Heath is a 26 y.o. female presenting on 10/13/2016 for Anxiety & Depression (followup; stopped Zoloft after a week because she broke out in a rash; patient reports she has had no depression in the last 2 weeks)   HPI Anxiety depression Patient is coming back for anxiety depression recheck and ADHD. She wanted to get started on ADHD medications that we still do not have her records from her psychiatry. She Zoloft for 2 weeks that she had a rash just on her anterior legs but she thought was an allergy and stop the Zoloft. She says she does not feel as depressed as what she was previously but she is having anxiety and panic attacks about every other night when the anxiety builds up. She describes palpitations and flutters and jittery and very anxious. She says her depression is doing a lot better and thinks it was only initially because of postpartum. She denies any suicidal ideations or thoughts. Yourself. [facility] 10/13/2016 09/11/2016  Decreased Interest 0 2  Down, Depressed, Hopeless 0 2  PHQ - 2 Score 0 4  Altered sleeping - 1  Tired, decreased energy - 3  Change in appetite - 3  Feeling bad or failure about yourself  - 1  Trouble concentrating - 3  Moving slowly or fidgety/restless - 0  Suicidal thoughts - 0  PHQ-9 Score - 15     Relevant past medical, surgical, family and social history reviewed and updated as indicated. Interim medical history since our last visit reviewed. Allergies and medications reviewed and updated.  Review of Systems  Constitutional: Negative for chills and fever.  HENT: Negative for congestion, ear discharge and ear pain.   Respiratory: Negative for chest tightness and shortness of breath.     Cardiovascular: Negative for chest pain and leg swelling.  Gastrointestinal: Negative for abdominal distention, abdominal pain, blood in stool and constipation.  Musculoskeletal: Negative for back pain and gait problem.  Skin: Negative for rash.  Neurological: Negative for dizziness, light-headedness and headaches.  Psychiatric/Behavioral: Positive for decreased concentration, dysphoric mood and sleep disturbance. Negative for agitation and behavioral problems. The patient is nervous/anxious.   All other systems reviewed and are negative.   Per HPI unless specifically indicated above     Objective:    BP 108/67   Pulse 82   Temp 97.4 F (36.3 C) (Oral)   Ht 5\' 6"  (1.676 m)   Wt (!) 313 lb (142 kg)   BMI 50.52 kg/m   Wt Readings from Last 3 Encounters:  10/13/16 (!) 313 lb (142 kg)  09/11/16 (!) 315 lb 9.6 oz (143.2 kg)  08/28/16 (!) 318 lb (144.2 kg)    Physical Exam  Constitutional: She is oriented to person, place, and time. She appears well-developed and well-nourished. No distress.  Eyes: Conjunctivae are normal.  Cardiovascular: Normal rate, regular rhythm, normal heart sounds and intact distal pulses.   No murmur heard. Pulmonary/Chest: Effort normal and breath sounds normal. No respiratory distress. She has no wheezes. She has no rales.  Musculoskeletal: Normal range of motion. She exhibits no edema or tenderness.  Neurological: She is alert  and oriented to person, place, and time. Coordination normal.  Skin: Skin is warm and dry. No rash noted. She is not diaphoretic.  Psychiatric: Her behavior is normal. Judgment normal. Her mood appears anxious. She exhibits a depressed mood. She expresses no suicidal ideation. She expresses no suicidal plans.  Nursing note and vitals reviewed.     Assessment & Plan:   Problem List Items Addressed This Visit      Other   Anxiety and depression - Primary   Relevant Medications   DULoxetine (CYMBALTA) 30 MG capsule      We'll still request records for the ADHD evaluation that she supposedly had with her psychiatrist. Discussed her possibly going back there but she does not want see for a reason that she does not want to discuss with [place].   Follow up plan: Return in about 4 weeks (around 11/10/2016), or if symptoms worsen or fail to improve, for Recheck anxiety.  Counseling provided for all of the vaccine components No orders of the defined types were placed in this encounter.   Arville Care, MD [facility] 10/13/2016, 10:07 AM

## 2016-10-15 ENCOUNTER — Other Ambulatory Visit: Payer: Self-pay | Admitting: Family Medicine

## 2016-10-15 DIAGNOSIS — R41840 Attention and concentration deficit: Secondary | ICD-10-CM

## 2016-10-15 DIAGNOSIS — F329 Major depressive disorder, single episode, unspecified: Secondary | ICD-10-CM

## 2016-10-15 DIAGNOSIS — F419 Anxiety disorder, unspecified: Principal | ICD-10-CM

## 2016-11-07 ENCOUNTER — Telehealth: Payer: Self-pay | Admitting: *Deleted

## 2016-11-07 NOTE — Telephone Encounter (Signed)
Pt called stating that she had not had a period since she delivered 3 months ago. She states that she has not had sex so she knows that she is not pregnant. She states that she is not breast feeding. She stated that this was very concerning to her and she would like to be seen. I connected her to front desk to schedule an appointment.

## 2016-11-13 ENCOUNTER — Ambulatory Visit: Payer: Medicaid Other | Admitting: Family Medicine

## 2016-11-19 ENCOUNTER — Ambulatory Visit: Payer: Medicaid Other | Admitting: Family Medicine

## 2016-11-20 ENCOUNTER — Telehealth: Payer: Self-pay | Admitting: Family Medicine

## 2016-11-20 ENCOUNTER — Encounter: Payer: Self-pay | Admitting: Family Medicine

## 2016-11-27 NOTE — Telephone Encounter (Signed)
lmtcb jkp 5/10

## 2016-12-01 ENCOUNTER — Ambulatory Visit (INDEPENDENT_AMBULATORY_CARE_PROVIDER_SITE_OTHER): Payer: Medicaid Other | Admitting: Family Medicine

## 2016-12-01 ENCOUNTER — Encounter: Payer: Self-pay | Admitting: Family Medicine

## 2016-12-01 VITALS — BP 119/77 | HR 81 | Temp 98.0°F | Ht 66.0 in | Wt 307.0 lb

## 2016-12-01 DIAGNOSIS — G43009 Migraine without aura, not intractable, without status migrainosus: Secondary | ICD-10-CM | POA: Diagnosis not present

## 2016-12-01 DIAGNOSIS — Z111 Encounter for screening for respiratory tuberculosis: Secondary | ICD-10-CM | POA: Diagnosis not present

## 2016-12-01 MED ORDER — SUMATRIPTAN SUCCINATE 100 MG PO TABS: 100.0000 mg | ORAL_TABLET | ORAL | 6 refills | Status: DC | PRN

## 2016-12-01 NOTE — Progress Notes (Signed)
BP 119/77   Pulse 81   Temp 98 F (36.7 C) (Oral)   Ht 5\' 6"  (1.676 m)   Wt (!) 307 lb (139.3 kg)   BMI 49.55 kg/m    Subjective:    Patient ID: Sierra Heath, female    DOB: Jan 10, 1991, 26 y.o.   MRN: [id number]  HPI: Sierra Heath is a 26 y.o. female presenting on 12/01/2016 for Medication Refill (Sumatriptan ) and PPD Placement   HPI Migraine headache follow-up Patient is coming in for migraine headache follow-up. She did not like the Cymbalta and only took it for a week so we did not note for work for prevention or not. She was given sumatriptan at an urgent care and like it worked very well for like to continue with the sumatriptan. Her headaches are mostly frontal and right sided and are occasionally associated with photophobia. She says a sumatriptan usually stops it pretty quickly and does well for her. She denies any anxiety or depression and any need for medication for that at this point.  Relevant past medical, surgical, family and social history reviewed and updated as indicated. Interim medical history since our last visit reviewed. Allergies and medications reviewed and updated.  Review of Systems  Constitutional: Negative for chills and fever.  Respiratory: Negative for chest tightness and shortness of breath.   Cardiovascular: Negative for chest pain and leg swelling.  Musculoskeletal: Negative for back pain and gait problem.  Skin: Negative for rash.  Neurological: Positive for headaches. Negative for dizziness and light-headedness.  Psychiatric/Behavioral: Negative for agitation, behavioral problems and dysphoric mood. The patient is not nervous/anxious.   All other systems reviewed and are negative.   Per HPI unless specifically indicated above        Objective:    BP 119/77   Pulse 81   Temp 98 F (36.7 C) (Oral)   Ht 5\' 6"  (1.676 m)   Wt (!) 307 lb (139.3 kg)   BMI 49.55 kg/m   Wt Readings from Last 3 Encounters:  12/01/16 (!) 307 lb  (139.3 kg)  10/13/16 (!) 313 lb (142 kg)  09/11/16 (!) 315 lb 9.6 oz (143.2 kg)    Physical Exam  Constitutional: She is oriented to person, place, and time. She appears well-developed and well-nourished. No distress.  Eyes: Conjunctivae are normal.  Cardiovascular: Normal rate, regular rhythm, normal heart sounds and intact distal pulses.   No murmur heard. Pulmonary/Chest: Effort normal and breath sounds normal. No respiratory distress. She has no wheezes.  Musculoskeletal: Normal range of motion.  Neurological: She is alert and oriented to person, place, and time. No cranial nerve deficit. Coordination normal.  Skin: Skin is warm and dry. No rash noted. She is not diaphoretic.  Psychiatric: She has a normal mood and affect. Her behavior is normal. Thought content normal.  Nursing note and vitals reviewed.       Assessment & Plan:   Problem List Items Addressed This Visit      Cardiovascular and Mediastinum   Migraine headache without aura   Relevant Medications   SUMAtriptan (IMITREX) 100 MG tablet    Other Visit Diagnoses    Encounter for TB tine test    -  Primary   Relevant Orders   PPD (Completed)       Follow up plan: Return if symptoms worsen or fail to improve.  Counseling provided for all of the vaccine components Orders Placed This Encounter  Procedures  . PPD  Arville CareJoshua Reiss Mowrey, MD [facility] 12/01/2016, 10:11 AM

## 2016-12-03 ENCOUNTER — Encounter: Payer: Self-pay | Admitting: *Deleted

## 2016-12-24 ENCOUNTER — Telehealth: Payer: Self-pay | Admitting: Family Medicine

## 2016-12-24 NOTE — Telephone Encounter (Signed)
Please advise I only see migraines in her chart as far as pain

## 2016-12-25 NOTE — Telephone Encounter (Signed)
I honestly cannot remember why either, she may have to come in so we can address this issue.

## 2016-12-25 NOTE — Telephone Encounter (Signed)
Left detailed regarding pt's need to schedule appt with Dr Louanne Skyeettinger

## 2016-12-29 ENCOUNTER — Telehealth: Payer: Self-pay | Admitting: Family Medicine

## 2016-12-29 NOTE — Telephone Encounter (Signed)
Please advise 

## 2016-12-31 NOTE — Telephone Encounter (Signed)
lmtcb 12/31/16

## 2016-12-31 NOTE — Telephone Encounter (Signed)
Notified pt, we have not seen any paperwork. She will call [facility] again.

## 2017-01-09 ENCOUNTER — Ambulatory Visit: Payer: Medicaid Other | Admitting: Family Medicine

## 2017-05-09 ENCOUNTER — Other Ambulatory Visit: Payer: Self-pay | Admitting: Family Medicine

## 2017-05-09 DIAGNOSIS — K2100 Gastro-esophageal reflux disease with esophagitis, without bleeding: Secondary | ICD-10-CM

## 2017-06-26 ENCOUNTER — Other Ambulatory Visit: Payer: Self-pay | Admitting: Family Medicine

## 2017-06-26 DIAGNOSIS — K2100 Gastro-esophageal reflux disease with esophagitis, without bleeding: Secondary | ICD-10-CM

## 2017-08-14 ENCOUNTER — Other Ambulatory Visit: Payer: Self-pay | Admitting: Family Medicine

## 2017-08-14 ENCOUNTER — Telehealth: Payer: Self-pay | Admitting: Family Medicine

## 2017-08-14 DIAGNOSIS — K2100 Gastro-esophageal reflux disease with esophagitis, without bleeding: Secondary | ICD-10-CM

## 2017-08-17 MED ORDER — OMEPRAZOLE 20 MG PO CPDR: DELAYED_RELEASE_CAPSULE | ORAL | 0 refills | Status: DC

## 2017-08-17 NOTE — Telephone Encounter (Signed)
Med sent to pharmacy per patients request 

## 2017-08-17 NOTE — Telephone Encounter (Signed)
Last seen 12/01/16  Dr Dettinger

## 2018-03-11 ENCOUNTER — Other Ambulatory Visit: Payer: Self-pay | Admitting: Family Medicine

## 2018-03-11 DIAGNOSIS — K2100 Gastro-esophageal reflux disease with esophagitis, without bleeding: Secondary | ICD-10-CM

## 2018-03-12 ENCOUNTER — Telehealth: Payer: Self-pay | Admitting: Family Medicine

## 2018-03-12 NOTE — Telephone Encounter (Signed)
appt scheduled

## 2018-03-15 ENCOUNTER — Ambulatory Visit: Payer: Medicaid Other | Admitting: Family Medicine

## 2018-03-15 ENCOUNTER — Encounter: Payer: Self-pay | Admitting: Family Medicine

## 2018-03-15 VITALS — BP 129/67 | HR 76 | Temp 97.6°F | Ht 66.0 in | Wt 298.8 lb

## 2018-03-15 DIAGNOSIS — K2100 Gastro-esophageal reflux disease with esophagitis, without bleeding: Secondary | ICD-10-CM

## 2018-03-15 MED ORDER — OMEPRAZOLE 20 MG PO CPDR: DELAYED_RELEASE_CAPSULE | ORAL | 3 refills | Status: DC

## 2018-03-15 NOTE — Progress Notes (Signed)
BP 129/67   Pulse 76   Temp 97.6 F (36.4 C) (Oral)   Ht 5' 6"  (1.676 m)   Wt 298 lb 12.8 oz (135.5 kg)   BMI 48.23 kg/m    Subjective:    Patient ID: Sierra Heath, female    DOB: 02-May-1991, 27 y.o.   MRN: [id number]  HPI: Sierra Heath is a 27 y.o. female presenting on 03/15/2018 for Gastroesophageal Reflux (Patient needs refill of omprazole)   HPI GERD Patient is currently on omeprazole.  She denies any major symptoms or abdominal pain or belching or burping. She denies any blood in her stool or lightheadedness or dizziness.   Relevant past medical, surgical, family and social history reviewed and updated as indicated. Interim medical history since our last visit reviewed. Allergies and medications reviewed and updated.  Review of Systems  Constitutional: Negative for chills and fever.  Eyes: Negative for visual disturbance.  Respiratory: Negative for chest tightness and shortness of breath.   Cardiovascular: Negative for chest pain and leg swelling.  Gastrointestinal: Negative for abdominal distention, abdominal pain and constipation.  Musculoskeletal: Negative for back pain and gait problem.  Skin: Negative for rash.  Neurological: Negative for light-headedness and headaches.  Psychiatric/Behavioral: Negative for agitation and behavioral problems.  All other systems reviewed and are negative.   Per HPI unless specifically indicated above   Allergies as of 03/15/2018      Reactions   Hydrocodone Hives      Medication List        Accurate as of 03/15/18  2:15 PM. Always use your most recent med list.          omeprazole 20 MG capsule Commonly known as:  PRILOSEC TAKE (1) CAPSULE TWICE DAILY BEFORE MEALS.          Objective:    BP 129/67   Pulse 76   Temp 97.6 F (36.4 C) (Oral)   Ht 5' 6"  (1.676 m)   Wt 298 lb 12.8 oz (135.5 kg)   BMI 48.23 kg/m   Wt Readings from Last 3 Encounters:  03/15/18 298 lb 12.8 oz (135.5 kg)  12/01/16 (!)  307 lb (139.3 kg)  10/13/16 (!) 313 lb (142 kg)    Physical Exam  Constitutional: She is oriented to person, place, and time. She appears well-developed and well-nourished. No distress.  Eyes: Conjunctivae are normal.  Cardiovascular: Normal rate, regular rhythm, normal heart sounds and intact distal pulses.  No murmur heard. Pulmonary/Chest: Effort normal and breath sounds normal. No respiratory distress. She has no wheezes.  Abdominal: Soft. Bowel sounds are normal. She exhibits no distension and no mass. There is no tenderness. There is no guarding.  Neurological: She is alert and oriented to person, place, and time. Coordination normal.  Skin: Skin is warm and dry. No rash noted. She is not diaphoretic.  Psychiatric: She has a normal mood and affect. Her behavior is normal.  Nursing note and vitals reviewed.       Assessment & Plan:   Problem List Items Addressed This Visit      Other   Morbid obesity ([place])   Relevant Orders   Lipid panel    Other Visit Diagnoses    Gastroesophageal reflux disease with esophagitis    -  Primary   Relevant Medications   omeprazole (PRILOSEC) 20 MG capsule   Other Relevant Orders   CBC with Differential/Platelet   BMP8+EGFR       Follow up plan: Return  in about 1 year (around 03/16/2019), or if symptoms worsen or fail to improve.  Counseling provided for all of the vaccine components Orders Placed This Encounter  Procedures  . CBC with Differential/Platelet  . BMP8+EGFR  . Lipid panel    Caryl Pina, MD [facility] 03/15/2018, 2:15 PM

## 2018-07-09 ENCOUNTER — Ambulatory Visit: Payer: Medicaid Other | Admitting: Family Medicine

## 2018-07-12 ENCOUNTER — Ambulatory Visit: Payer: Medicaid Other | Admitting: Family Medicine

## 2018-07-12 ENCOUNTER — Encounter: Payer: Self-pay | Admitting: Family Medicine

## 2018-07-12 VITALS — BP 103/55 | HR 76 | Temp 97.7°F | Ht 66.0 in | Wt 309.4 lb

## 2018-07-12 DIAGNOSIS — F321 Major depressive disorder, single episode, moderate: Secondary | ICD-10-CM

## 2018-07-12 DIAGNOSIS — M62838 Other muscle spasm: Secondary | ICD-10-CM | POA: Diagnosis not present

## 2018-07-12 MED ORDER — BUPROPION HCL ER (XL) 150 MG PO TB24: 150.0000 mg | ORAL_TABLET | Freq: Every day | ORAL | 1 refills | Status: DC

## 2018-07-12 NOTE — Addendum Note (Signed)
Addended by: Arville CareETTINGER, JOSHUA on: 07/12/2018 09:45 AM   Modules accepted: Orders

## 2018-07-12 NOTE — Progress Notes (Signed)
BP (!) 103/55   Pulse 76   Temp 97.7 F (36.5 C) (Oral)   Ht 5\' 6"  (1.676 m)   Wt (!) 309 lb 6.4 oz (140.3 kg)   BMI 49.94 kg/m    Subjective:    Patient ID: Sierra MiresAllison N Heath, female    DOB: 02-19-1991, [age] y.o.   MRN: [id number]  HPI: Sierra Heath is a 27 y.o. female presenting on 07/12/2018 for Abdominal Cramping (Patient states that last few months once a week she will get a cramp under her left breast that will last no more than 10 mins and then go away.); Depression (Patient states its been going on x 1 year since her dad passed away); and Anxiety   HPI Depression and anxiety Patient is coming in for depression anxiety.  She says she is been struggling a lot over the past year since her dad passed away and is just built up to the point where she is crying every day and cannot control it.  She says she is at home with her 27-year-old and that is not helping the situation that she cannot get out or do anything.  She has been feeling more and more depressed.  She denies any suicidal ideations and definitely has motivation still to live.  She would like to try something [facility] screen [facility] 2/9 07/12/2018 03/15/2018 12/01/2016 10/13/2016 09/11/2016  Decreased Interest 3 2 0 0 2  Down, Depressed, Hopeless 3 2 0 0 2  PHQ - 2 Score 6 4 0 0 4  Altered sleeping 3 2 - - 1  Tired, decreased energy 3 2 - - 3  Change in appetite 1 0 - - 3  Feeling bad or failure about yourself  0 0 - - 1  Trouble concentrating 3 0 - - 3  Moving slowly or fidgety/restless 1 0 - - 0  Suicidal thoughts 0 0 - - 0  PHQ-9 Score 17 8 - - 15    Abdominal wall pain/cramping Patient has been having some left upper abdominal wall pain/cramping especially when she twists or rotates to one side.  She says the pain does not go anywhere else and she only gets it lasts like 5 minutes and then will ease up.  She says she mainly gets it when she is rotated or twisted to that side and then the muscles will get tight and  start cramping.  Relevant past medical, surgical, family and social history reviewed and updated as indicated. Interim medical history since our last visit reviewed. Allergies and medications reviewed and updated.  Review of Systems  Constitutional: Negative for chills and fever.  HENT: Negative for congestion, ear discharge and ear pain.   Eyes: Negative for visual disturbance.  Respiratory: Negative for chest tightness and shortness of breath.   Cardiovascular: Negative for chest pain and leg swelling.  Gastrointestinal: Positive for abdominal pain. Negative for abdominal distention, blood in stool, constipation, diarrhea, nausea and vomiting.  Genitourinary: Negative for difficulty urinating and dysuria.  Musculoskeletal: Negative for back pain and gait problem.  Skin: Negative for rash.  Neurological: Negative for light-headedness and headaches.  Psychiatric/Behavioral: Positive for decreased concentration, dysphoric mood and sleep disturbance. Negative for agitation, behavioral problems, self-injury and suicidal ideas. The patient is nervous/anxious.   All other systems reviewed and are negative.   Per HPI unless specifically indicated above   Allergies as of 07/12/2018      Reactions   Hydrocodone Hives      Medication List  Accurate as of July 12, 2018  9:43 AM. Always use your most recent med list.        buPROPion 150 MG 24 hr tablet Commonly known as:  WELLBUTRIN XL Take 1 tablet (150 mg total) by mouth daily.   omeprazole 20 MG capsule Commonly known as:  PRILOSEC TAKE (1) CAPSULE TWICE DAILY BEFORE MEALS.          Objective:    BP (!) 103/55   Pulse 76   Temp 97.7 F (36.5 C) (Oral)   Ht 5\' 6"  (1.676 m)   Wt (!) 309 lb 6.4 oz (140.3 kg)   BMI 49.94 kg/m   Wt Readings from Last 3 Encounters:  07/12/18 (!) 309 lb 6.4 oz (140.3 kg)  03/15/18 298 lb 12.8 oz (135.5 kg)  12/01/16 (!) 307 lb (139.3 kg)    Physical Exam Vitals signs and  nursing note reviewed.  Constitutional:      General: She is not in acute distress.    Appearance: She is well-developed. She is not diaphoretic.  Eyes:     Conjunctiva/sclera: Conjunctivae normal.  Cardiovascular:     Rate and Rhythm: Normal rate and regular rhythm.     Heart sounds: Normal heart sounds. No murmur.  Pulmonary:     Effort: Pulmonary effort is normal. No respiratory distress.     Breath sounds: Normal breath sounds. No wheezing.  Abdominal:     General: Abdomen is flat. Bowel sounds are normal. There is no distension.     Palpations: There is no mass.     Tenderness: There is no abdominal tenderness. There is no right CVA tenderness, left CVA tenderness, guarding or rebound.     Hernia: No hernia is present.  Musculoskeletal: Normal range of motion.        General: No tenderness.  Skin:    General: Skin is warm and dry.     Findings: No rash.  Neurological:     Mental Status: She is alert and oriented to person, place, and time.     Coordination: Coordination normal.  Psychiatric:        Mood and Affect: Mood is anxious and depressed.        Behavior: Behavior normal.        Thought Content: Thought content does not include suicidal ideation. Thought content does not include suicidal plan.         Assessment & Plan:   Problem List Items Addressed This Visit    None    Visit Diagnoses    Depression, major, single episode, moderate (HCC)    -  Primary   Relevant Medications   buPROPion (WELLBUTRIN XL) 150 MG 24 hr tablet   Muscle spasm       Sounds like abdominal wall muscle spasms, recommended potassium and magnesium and stretching and exercise       Follow up plan: Return in about 4 weeks (around 08/09/2018), or if symptoms worsen or fail to improve, for Depression recheck.  Counseling provided for all of the vaccine components No orders of the defined types were placed in this encounter.   Arville CareJoshua Veretta Sabourin, MD [facility] 07/12/2018, 9:43 AM

## 2018-07-13 ENCOUNTER — Other Ambulatory Visit: Payer: Self-pay | Admitting: Family

## 2018-07-13 LAB — CMP14+EGFR
ALT: 36 IU/L — ABNORMAL HIGH (ref 0–32)
AST: 25 IU/L (ref 0–40)
Albumin/Globulin Ratio: 2 (ref 1.2–2.2)
Albumin: 3.8 g/dL (ref 3.5–5.5)
Alkaline Phosphatase: 84 IU/L (ref 39–117)
BUN/Creatinine Ratio: 11 (ref 9–23)
BUN: 6 mg/dL (ref 6–20)
Bilirubin Total: 0.2 mg/dL (ref 0.0–1.2)
CALCIUM: 9 mg/dL (ref 8.7–10.2)
CO2: 22 mmol/L (ref 20–29)
Chloride: 100 mmol/L (ref 96–106)
Creatinine, Ser: 0.54 mg/dL — ABNORMAL LOW (ref 0.57–1.00)
GFR calc non Af Amer: 130 mL/min/{1.73_m2} (ref >59)
GFR, EST AFRICAN AMERICAN: 150 mL/min/{1.73_m2} (ref >59)
Globulin, Total: 1.9 g/dL (ref 1.5–4.5)
Glucose: 96 mg/dL (ref 65–99)
Potassium: 4.3 mmol/L (ref 3.5–5.2)
Sodium: 137 mmol/L (ref 134–144)
TOTAL PROTEIN: 5.7 g/dL — AB (ref 6.0–8.5)

## 2018-07-13 LAB — CBC WITH DIFFERENTIAL/PLATELET
BASOS: 1 %
Basophils Absolute: 0.1 10*3/uL (ref 0.0–0.2)
EOS (ABSOLUTE): 0.1 10*3/uL (ref 0.0–0.4)
Eos: 1 %
HEMOGLOBIN: 7.5 g/dL — AB (ref 11.1–15.9)
Hematocrit: 26.7 % — ABNORMAL LOW (ref 34.0–46.6)
IMMATURE GRANS (ABS): 0 10*3/uL (ref 0.0–0.1)
Immature Granulocytes: 0 %
LYMPHS: 23 %
Lymphocytes Absolute: 1.7 10*3/uL (ref 0.7–3.1)
MCH: 18.1 pg — ABNORMAL LOW (ref 26.6–33.0)
MCHC: 28.1 g/dL — ABNORMAL LOW (ref 31.5–35.7)
MCV: 65 fL — ABNORMAL LOW (ref 79–97)
Monocytes Absolute: 0.4 10*3/uL (ref 0.1–0.9)
Monocytes: 6 %
Neutrophils Absolute: 5.1 10*3/uL (ref 1.4–7.0)
Neutrophils: 69 %
Platelets: 348 10*3/uL (ref 150–450)
RBC: 4.14 x10E6/uL (ref 3.77–5.28)
RDW: 18.4 % — ABNORMAL HIGH (ref 12.3–15.4)
WBC: 7.3 10*3/uL (ref 3.4–10.8)

## 2018-07-13 LAB — TSH: TSH: 1.3 u[IU]/mL (ref 0.450–4.500)

## 2018-07-19 ENCOUNTER — Telehealth: Payer: Self-pay | Admitting: Family Medicine

## 2018-07-19 DIAGNOSIS — D509 Iron deficiency anemia, unspecified: Secondary | ICD-10-CM

## 2018-07-19 MED ORDER — VITAMIN C 500 MG PO TABS: 500.0000 mg | ORAL_TABLET | Freq: Every day | ORAL | 3 refills | Status: DC

## 2018-07-19 MED ORDER — FERROUS SULFATE 325 (65 FE) MG PO TABS: 325.0000 mg | ORAL_TABLET | Freq: Every day | ORAL | 3 refills | Status: DC

## 2018-07-19 NOTE — Telephone Encounter (Signed)
I sent an iron and vitamin C for her, please have her take both together once a day

## 2018-07-19 NOTE — Telephone Encounter (Signed)
Please advise on request for iron medication.

## 2018-07-27 NOTE — Telephone Encounter (Signed)
Patient aware.

## 2018-08-16 ENCOUNTER — Ambulatory Visit: Payer: Medicaid Other | Admitting: Family Medicine

## 2018-08-18 ENCOUNTER — Encounter: Payer: Self-pay | Admitting: Family Medicine

## 2018-09-09 ENCOUNTER — Ambulatory Visit: Payer: Medicaid Other | Admitting: Adult Health

## 2018-09-14 ENCOUNTER — Ambulatory Visit: Payer: Medicaid Other | Admitting: Adult Health

## 2018-09-14 ENCOUNTER — Encounter: Payer: Self-pay | Admitting: Adult Health

## 2018-09-14 VITALS — BP 116/70 | HR 79 | Ht 66.0 in | Wt 301.0 lb

## 2018-09-14 DIAGNOSIS — Z3201 Encounter for pregnancy test, result positive: Secondary | ICD-10-CM | POA: Diagnosis not present

## 2018-09-14 DIAGNOSIS — Z98891 History of uterine scar from previous surgery: Secondary | ICD-10-CM | POA: Insufficient documentation

## 2018-09-14 DIAGNOSIS — N926 Irregular menstruation, unspecified: Secondary | ICD-10-CM | POA: Diagnosis not present

## 2018-09-14 DIAGNOSIS — O3680X0 Pregnancy with inconclusive fetal viability, not applicable or unspecified: Secondary | ICD-10-CM

## 2018-09-14 DIAGNOSIS — Z3A01 Less than 8 weeks gestation of pregnancy: Secondary | ICD-10-CM

## 2018-09-14 LAB — POCT URINE PREGNANCY: Preg Test, Ur: POSITIVE — AB

## 2018-09-14 MED ORDER — PROMETHAZINE HCL 25 MG PO TABS: 25.0000 mg | ORAL_TABLET | Freq: Four times a day (QID) | ORAL | 1 refills | Status: DC | PRN

## 2018-09-14 MED ORDER — PRENATAL PLUS 27-1 MG PO TABS: 1.0000 | ORAL_TABLET | Freq: Every day | ORAL | 12 refills | Status: DC

## 2018-09-14 NOTE — Progress Notes (Signed)
Patient ID: Sierra Heath, female   DOB: Nov 22, 1990, 28 y.o.   MRN: [id number] History of Present Illness:  Sierra Heath is a 28 year old white female, single, in for UPT, has missed a period and had 6+HPTs.She had 2  Csections.  PCP is Dr Louanne Skye.   Current Medications, Allergies, Past Medical History, Past Surgical History, Family History and Social History were reviewed in [employer] record.     Review of Systems: +missed period 6+HPTs +nasuea    Physical Exam:BP 116/70 (BP Location: Right Arm, Patient Position: Sitting, Cuff Size: Large)   Pulse 79   Ht 5\' 6"  (1.676 m)   Wt (!) 301 lb (136.5 kg)   LMP 07/24/2018   BMI 48.58 kg/m   UPT + about 7+3 weeks by LMP with EDD 05/01/2019 General:  Well developed, well nourished, no acute distress Skin:  Warm and dry Neck:  Midline trachea, normal thyroid, good ROM, no lymphadenopathy Lungs; Clear to auscultation bilaterally Cardiovascular: Regular rate and rhythm Abdomen:  Soft, non tender  Psych:  No mood changes, alert and cooperative,seems happy   Impression: 1. Pregnancy examination or test, positive result   2. Less than [redacted] weeks gestation of pregnancy   3. Encounter to determine fetal viability of pregnancy, single or unspecified fetus   4. History of C-section       Plan: Meds ordered this encounter  Medications  . prenatal vitamin w/FE, FA (PRENATAL 1 + 1) 27-1 MG TABS tablet    Sig: Take 1 tablet by mouth daily at 12 noon.    Dispense:  30 each    Refill:  12    Order Specific Question:   Supervising Provider    Answer:   Despina Hidden, LUTHER H [[id number]]  . promethazine (PHENERGAN) 25 MG tablet    Sig: Take 1 tablet (25 mg total) by mouth every 6 (six) hours as needed for nausea or vomiting.    Dispense:  30 tablet    Refill:  1    Order Specific Question:   Supervising Provider    Answer:   Lazaro Arms [[id number]]  Eat often Return in 1 week for dating US/3 weeks for new OB Review handout on First  trimester and by [facility]

## 2018-09-14 NOTE — Patient Instructions (Signed)
First Trimester of Pregnancy  The first trimester of pregnancy is from week 1 until the end of week 13 (months 1 through 3). A week after a sperm fertilizes an egg, the egg will implant on the wall of the uterus. This embryo will begin to develop into a baby. Genes from you and your partner will form the baby. The female genes will determine whether the baby will be a boy or a girl. At 6-8 weeks, the eyes and face will be formed, and the heartbeat can be seen on ultrasound. At the end of 12 weeks, all the baby's organs will be formed.  Now that you are pregnant, you will want to do everything you can to have a healthy baby. Two of the most important things are to get good prenatal care and to follow your health care provider's instructions. Prenatal care is all the medical care you receive before the baby's birth. This care will help prevent, find, and treat any problems during the pregnancy and childbirth.  Body changes during your first trimester  Your body goes through many changes during pregnancy. The changes vary from woman to woman.   You may gain or lose a couple of pounds at first.   You may feel sick to your stomach (nauseous) and you may throw up (vomit). If the vomiting is uncontrollable, call your health care provider.   You may tire easily.   You may develop headaches that can be relieved by medicines. All medicines should be approved by your health care provider.   You may urinate more often. Painful urination may mean you have a bladder infection.   You may develop heartburn as a result of your pregnancy.   You may develop constipation because certain hormones are causing the muscles that push stool through your intestines to slow down.   You may develop hemorrhoids or swollen veins (varicose veins).   Your breasts may begin to grow larger and become tender. Your nipples may stick out more, and the tissue that surrounds them (areola) may become darker.   Your gums may bleed and may be  sensitive to brushing and flossing.   Dark spots or blotches (chloasma, mask of pregnancy) may develop on your face. This will likely fade after the baby is born.   Your menstrual periods will stop.   You may have a loss of appetite.   You may develop cravings for certain kinds of food.   You may have changes in your emotions from day to day, such as being excited to be pregnant or being concerned that something may go wrong with the pregnancy and baby.   You may have more vivid and strange dreams.   You may have changes in your hair. These can include thickening of your hair, rapid growth, and changes in texture. Some women also have hair loss during or after pregnancy, or hair that feels dry or thin. Your hair will most likely return to normal after your baby is born.  What to expect at prenatal visits  During a routine prenatal visit:   You will be weighed to make sure you and the baby are growing normally.   Your blood pressure will be taken.   Your abdomen will be measured to track your baby's growth.   The fetal heartbeat will be listened to between weeks 10 and 14 of your pregnancy.   Test results from any previous visits will be discussed.  Your health care provider may ask you:     How you are feeling.   If you are feeling the baby move.   If you have had any abnormal symptoms, such as leaking fluid, bleeding, severe headaches, or abdominal cramping.   If you are using any tobacco products, including cigarettes, chewing tobacco, and electronic cigarettes.   If you have any questions.  Other tests that may be performed during your first trimester include:   Blood tests to find your blood type and to check for the presence of any previous infections. The tests will also be used to check for low iron levels (anemia) and protein on red blood cells (Rh antibodies). Depending on your risk factors, or if you previously had diabetes during pregnancy, you may have tests to check for high blood sugar  that affects pregnant women (gestational diabetes).   Urine tests to check for infections, diabetes, or protein in the urine.   An ultrasound to confirm the proper growth and development of the baby.   Fetal screens for spinal cord problems (spina bifida) and Down syndrome.   HIV (human immunodeficiency virus) testing. Routine prenatal testing includes screening for HIV, unless you choose not to have this test.   You may need other tests to make sure you and the baby are doing well.  Follow these instructions at home:  Medicines   Follow your health care provider's instructions regarding medicine use. Specific medicines may be either safe or unsafe to take during pregnancy.   Take a prenatal vitamin that contains at least 600 micrograms (mcg) of folic acid.   If you develop constipation, try taking a stool softener if your health care provider approves.  Eating and drinking     Eat a balanced diet that includes fresh fruits and vegetables, whole grains, good sources of protein such as meat, eggs, or tofu, and low-fat dairy. Your health care provider will help you determine the amount of weight gain that is right for you.   Avoid raw meat and uncooked cheese. These carry germs that can cause birth defects in the baby.   Eating four or five small meals rather than three large meals a day may help relieve nausea and vomiting. If you start to feel nauseous, eating a few soda crackers can be helpful. Drinking liquids between meals, instead of during meals, also seems to help ease nausea and vomiting.   Limit foods that are high in fat and processed sugars, such as fried and sweet foods.   To prevent constipation:  ? Eat foods that are high in fiber, such as fresh fruits and vegetables, whole grains, and beans.  ? Drink enough fluid to keep your urine clear or pale yellow.  Activity   Exercise only as directed by your health care provider. Most women can continue their usual exercise routine during  pregnancy. Try to exercise for 30 minutes at least 5 days a week. Exercising will help you:  ? Control your weight.  ? Stay in shape.  ? Be prepared for labor and delivery.   Experiencing pain or cramping in the lower abdomen or lower back is a good sign that you should stop exercising. Check with your health care provider before continuing with normal exercises.   Try to avoid standing for long periods of time. Move your legs often if you must stand in one place for a long time.   Avoid heavy lifting.   Wear low-heeled shoes and practice good posture.   You may continue to have sex unless your health care   provider tells you not to.  Relieving pain and discomfort   Wear a good support bra to relieve breast tenderness.   Take warm sitz baths to soothe any pain or discomfort caused by hemorrhoids. Use hemorrhoid cream if your health care provider approves.   Rest with your legs elevated if you have leg cramps or low back pain.   If you develop varicose veins in your legs, wear support hose. Elevate your feet for 15 minutes, 3-4 times a day. Limit salt in your diet.  Prenatal care   Schedule your prenatal visits by the twelfth week of pregnancy. They are usually scheduled monthly at first, then more often in the last 2 months before delivery.   Write down your questions. Take them to your prenatal visits.   Keep all your prenatal visits as told by your health care provider. This is important.  Safety   Wear your seat belt at all times when driving.   Make a list of emergency phone numbers, including numbers for family, friends, the hospital, and police and fire departments.  General instructions   Ask your health care provider for a referral to a local prenatal education class. Begin classes no later than the beginning of month 6 of your pregnancy.   Ask for help if you have counseling or nutritional needs during pregnancy. Your health care provider can offer advice or refer you to specialists for help  with various needs.   Do not use hot tubs, steam rooms, or saunas.   Do not douche or use tampons or scented sanitary pads.   Do not cross your legs for long periods of time.   Avoid cat litter boxes and soil used by cats. These carry germs that can cause birth defects in the baby and possibly loss of the fetus by miscarriage or stillbirth.   Avoid all smoking, herbs, alcohol, and medicines not prescribed by your health care provider. Chemicals in these products affect the formation and growth of the baby.   Do not use any products that contain nicotine or tobacco, such as cigarettes and e-cigarettes. If you need help quitting, ask your health care provider. You may receive counseling support and other resources to help you quit.   Schedule a dentist appointment. At home, brush your teeth with a soft toothbrush and be gentle when you floss.  Contact a health care provider if:   You have dizziness.   You have mild pelvic cramps, pelvic pressure, or nagging pain in the abdominal area.   You have persistent nausea, vomiting, or diarrhea.   You have a bad smelling vaginal discharge.   You have pain when you urinate.   You notice increased swelling in your face, hands, legs, or ankles.   You are exposed to fifth disease or chickenpox.   You are exposed to German measles (rubella) and have never had it.  Get help right away if:   You have a fever.   You are leaking fluid from your vagina.   You have spotting or bleeding from your vagina.   You have severe abdominal cramping or pain.   You have rapid weight gain or loss.   You vomit blood or material that looks like coffee grounds.   You develop a severe headache.   You have shortness of breath.   You have any kind of trauma, such as from a fall or a car accident.  Summary   The first trimester of pregnancy is from week 1 until   the end of week 13 (months 1 through 3).   Your body goes through many changes during pregnancy. The changes vary from  woman to woman.   You will have routine prenatal visits. During those visits, your health care provider will examine you, discuss any test results you may have, and talk with you about how you are feeling.  This information is not intended to replace advice given to you by your health care provider. Make sure you discuss any questions you have with your health care provider.  Document Released: 07/01/2001 Document Revised: 06/18/2016 Document Reviewed: 06/18/2016  Elsevier Interactive Patient Education  2019 [employer]

## 2018-09-20 ENCOUNTER — Ambulatory Visit (INDEPENDENT_AMBULATORY_CARE_PROVIDER_SITE_OTHER): Payer: Medicaid Other

## 2018-09-20 DIAGNOSIS — O3680X0 Pregnancy with inconclusive fetal viability, not applicable or unspecified: Secondary | ICD-10-CM

## 2018-09-20 DIAGNOSIS — Z3A08 8 weeks gestation of pregnancy: Secondary | ICD-10-CM

## 2018-09-20 NOTE — Progress Notes (Signed)
[place] 8+2 wks,single IUP w/ys,positive fht 153 bpm,crl 16.76 mm

## 2018-10-05 ENCOUNTER — Ambulatory Visit: Payer: Medicaid Other | Admitting: *Deleted

## 2018-10-05 ENCOUNTER — Encounter: Payer: Medicaid Other | Admitting: Advanced Practice Midwife

## 2018-10-05 ENCOUNTER — Encounter: Payer: Self-pay | Admitting: Advanced Practice Midwife

## 2018-10-05 ENCOUNTER — Ambulatory Visit: Payer: Self-pay | Admitting: *Deleted

## 2018-10-05 ENCOUNTER — Telehealth: Payer: Self-pay | Admitting: Women's Health

## 2018-10-05 NOTE — Telephone Encounter (Signed)
LMOVM returning patient's call.  

## 2018-10-05 NOTE — Telephone Encounter (Signed)
Patient called, was having car trouble, had to reschedule her intake and appointment.  She is 10 weeks and spotting, very anxious.  [phone]

## 2018-10-06 NOTE — Telephone Encounter (Signed)
Patient states she has been having some light spotting on/off this weekend. Not having any today and no cramping is associated with it.  Just worried about the bleeding.  Informed patient that since she is not having any bleeding today or cramping, to just continue to monitor and if bleeding became heavier, passing clots or cramping, to let us know.  Informed unfortunately if she was experiencing a miscarriage, there is not anything that can be done at this time to prevent if from happening.  Encouraged her to push fluids and continue to monitor.  Verbalized understanding.

## 2018-10-13 ENCOUNTER — Telehealth: Payer: Self-pay | Admitting: *Deleted

## 2018-10-13 NOTE — Telephone Encounter (Signed)
Spoke with pt. Pt is [redacted] weeks pregnant. She is having headaches. Headache happens when she stands up to walk or when bending over. When she is sitting down, she don't have a headache. Has been short of breath with this pregnancy. Spoke with JAG. Advised can try Tylenol for headache to see if that helps. If headache continues or if she develops any new symptoms, call office. Pt voiced understanding. JSY

## 2018-10-13 NOTE — Telephone Encounter (Signed)
Patient called stating she is [redacted] weeks pregnant and she is having head aches. Patient wants to make sure this is okay. Please advise [phone]

## 2018-10-14 ENCOUNTER — Telehealth: Payer: Self-pay | Admitting: Family Medicine

## 2018-10-14 NOTE — Telephone Encounter (Signed)
Patient states she has checked her BP with readings as follows: 133/70 and 126/70, BS 104.  Says she was experiencing headaches yesterday.  Encouraged patient to eat foods high in protein and increase fluids. Verbalized understanding.

## 2018-10-14 NOTE — Telephone Encounter (Signed)
PT is wanting to talk to nurse about her CPAP machine it's not working properly and needs a new one, states she is currently pregnant.

## 2018-10-14 NOTE — Telephone Encounter (Signed)
Patient states she needs a new CPAP machine sent to [facility] pharmacy- patient is pregnant - FYI  Please advise and send if approved

## 2018-10-14 NOTE — Telephone Encounter (Signed)
PT has called back, message was put in this morning and routed to pool A another nurse from some other [facility] office put a comment in the original message and it was closed out. PT has called this afternoon due to not hearing back from original call this morning.   Original message:   PT is wanting to talk to nurse about her CPAP machine it's not working properly and needs a new one, states she is currently pregnant.

## 2018-10-15 NOTE — Telephone Encounter (Signed)
I do not know when she last had a sleep study because I cannot find one but I printed out the order of the previous one and I do not know if we can reorder it based off of this or not.

## 2018-10-18 ENCOUNTER — Encounter: Payer: Medicaid Other | Admitting: Women's Health

## 2018-10-18 ENCOUNTER — Ambulatory Visit: Payer: Medicaid Other | Admitting: *Deleted

## 2018-10-18 NOTE — Telephone Encounter (Signed)
Pt now going through [facility] instead of [facility] d/t insurance. Will place order on providers desk

## 2018-10-22 NOTE — Telephone Encounter (Signed)
Pt aware faxed to [facility]

## 2018-10-25 ENCOUNTER — Encounter: Payer: Medicaid Other | Admitting: Women's Health

## 2018-10-25 ENCOUNTER — Other Ambulatory Visit: Payer: Medicaid Other

## 2018-10-26 ENCOUNTER — Other Ambulatory Visit: Payer: Self-pay | Admitting: Obstetrics & Gynecology

## 2018-10-26 ENCOUNTER — Telehealth: Payer: Self-pay | Admitting: *Deleted

## 2018-10-26 DIAGNOSIS — Z3682 Encounter for antenatal screening for nuchal translucency: Secondary | ICD-10-CM

## 2018-10-26 NOTE — Telephone Encounter (Signed)
LMOVM that we are not allowing visitors or children to come to appointments at this time. Advised to call if has had any contact with anyone suspected or confirmed of having COVID-19; or if has fever, cough, sob, muscle pain, diarrhea, rash, vomiting, abdominal pain, red eye, weakness, bruising or bleeding, joint pain or severe headache.  

## 2018-10-27 ENCOUNTER — Encounter: Payer: Self-pay | Admitting: Advanced Practice Midwife

## 2018-10-27 ENCOUNTER — Other Ambulatory Visit: Payer: Self-pay

## 2018-10-27 ENCOUNTER — Ambulatory Visit: Payer: Medicaid Other | Admitting: *Deleted

## 2018-10-27 ENCOUNTER — Other Ambulatory Visit: Payer: Medicaid Other

## 2018-10-27 ENCOUNTER — Ambulatory Visit (INDEPENDENT_AMBULATORY_CARE_PROVIDER_SITE_OTHER): Payer: Medicaid Other | Admitting: Advanced Practice Midwife

## 2018-10-27 ENCOUNTER — Ambulatory Visit (INDEPENDENT_AMBULATORY_CARE_PROVIDER_SITE_OTHER): Payer: Medicaid Other

## 2018-10-27 VITALS — BP 112/70 | HR 82 | Wt 304.0 lb

## 2018-10-27 DIAGNOSIS — Z23 Encounter for immunization: Secondary | ICD-10-CM | POA: Diagnosis not present

## 2018-10-27 DIAGNOSIS — O0990 Supervision of high risk pregnancy, unspecified, unspecified trimester: Secondary | ICD-10-CM | POA: Insufficient documentation

## 2018-10-27 DIAGNOSIS — Z3682 Encounter for antenatal screening for nuchal translucency: Secondary | ICD-10-CM | POA: Diagnosis not present

## 2018-10-27 DIAGNOSIS — Z1389 Encounter for screening for other disorder: Secondary | ICD-10-CM

## 2018-10-27 DIAGNOSIS — Z1371 Encounter for nonprocreative screening for genetic disease carrier status: Secondary | ICD-10-CM

## 2018-10-27 DIAGNOSIS — Z3490 Encounter for supervision of normal pregnancy, unspecified, unspecified trimester: Secondary | ICD-10-CM

## 2018-10-27 DIAGNOSIS — Z3482 Encounter for supervision of other normal pregnancy, second trimester: Secondary | ICD-10-CM

## 2018-10-27 DIAGNOSIS — Z3A13 13 weeks gestation of pregnancy: Secondary | ICD-10-CM

## 2018-10-27 DIAGNOSIS — Z3481 Encounter for supervision of other normal pregnancy, first trimester: Secondary | ICD-10-CM | POA: Diagnosis not present

## 2018-10-27 DIAGNOSIS — Z331 Pregnant state, incidental: Secondary | ICD-10-CM

## 2018-10-27 DIAGNOSIS — Z124 Encounter for screening for malignant neoplasm of cervix: Secondary | ICD-10-CM

## 2018-10-27 LAB — POCT URINALYSIS DIPSTICK OB
Blood, UA: NEGATIVE
Glucose, UA: NEGATIVE
Ketones, UA: NEGATIVE
Leukocytes, UA: NEGATIVE
Nitrite, UA: NEGATIVE
POC,PROTEIN,UA: NEGATIVE

## 2018-10-27 NOTE — Progress Notes (Signed)
nINITIAL OBSTETRICAL VISIT Patient name: Sierra Miresllison N Oliveira MRN [id number]  Date of birth: [birth year] Chief Complaint:   Initial Prenatal Visit  History of Present Illness:   Sierra Heath is a 28 y.o. 193P2002 Caucasian female at 7524w4d by LMP/8 week US with an Estimated Date of Delivery: 04/30/19 being seen today for her initial obstetrical visit.   Her obstetrical history is significant for CS X2 (first one for failed IOL/chorio, 2nd was repeat after PROM @ 38.5 weeks).  Wants repeat, may do BTL if boy, paragard if girl..   Today she reports no problems. Still smokes MJ sometimes to help appetite, plans on stopping all together soon. .  Patient's last menstrual period was 07/24/2018. Last pap 2016. Results were: normal.  Pt had internal pelvic US just prior to seeing me, so pap deferred until next visit. Review of Systems:   Pertinent items are noted in HPI Denies cramping/contractions, leakage of fluid, vaginal bleeding, abnormal vaginal discharge w/ itching/odor/irritation, headaches, visual changes, shortness of breath, chest pain, abdominal pain, severe nausea/vomiting, or problems with urination or bowel movements unless otherwise stated above.  Pertinent History Reviewed:  Reviewed past medical,surgical, social, obstetrical and family history.  Reviewed problem list, medications and allergies. OB History  Gravida Para Term Preterm AB Living  3 2 2     2   SAB TAB Ectopic Multiple Live Births        0 2    # Outcome Date GA Lbr Len/2nd Weight Sex Delivery Anes PTL Lv  3 Current           2 Term 08/17/16 8117w6d  9 lb 1.3 oz (4.12 kg) F CS-Vac Spinal N LIV  1 Term 09/24/10 1058w6d  8 lb 5 oz (3.771 kg) F CS-LTranv EPI N LIV   Physical Assessment:   Vitals:   10/27/18 0938  BP: 112/70  Pulse: 82  Weight: (!) 304 lb (137.9 kg)  Body mass index is 49.07 kg/m.       Physical Examination:  General appearance - well appearing, and in no distress  Mental status - alert, oriented to  person, place, and time  Psych:  She has a normal mood and affect  Skin - warm and dry, normal color, no suspicious lesions noted  Chest - effort normal, all lung fields clear to auscultation bilaterally  Heart - normal rate and regular rhythm  Abdomen - soft, nontender  Extremities:  No swelling or varicosities noted   Fetal Heart Rate (bpm): 161 via US US TA/TV:13+4 wks,measurements c/w dates,normal ovaries bilat,posterior placenta,NB present,unable to obtain NT because of fetal position and body habitus,crl 73.12 mm,fhr 161 bpm Results for orders placed or performed in visit on 10/27/18 (from the past 24 hour(s))  POC Urinalysis Dipstick OB   Collection Time: 10/27/18  9:01 AM  Result Value Ref Range   Color, UA     Clarity, UA     Glucose, UA Negative Negative   Bilirubin, UA     Ketones, UA neg    Spec Grav, UA     Blood, UA neg    pH, UA     POC,PROTEIN,UA Negative Negative, Trace, Small (1+), Moderate (2+), Large (3+), 4+   Urobilinogen, UA     Nitrite, UA neg    Leukocytes, UA Negative Negative   Appearance     Odor      Assessment & Plan:  1) Low-Risk Pregnancy G3P2002 at 8324w4d with an Estimated Date of Delivery: 04/30/19   2)  Initial OB visit  3) Morbid obesity.  Advised to stop smoking pot, and don't feel the need to smoke to increase appetite.  Little to no weight gain recommended.   Meds: No orders of the defined types were placed in this encounter.   Initial labs obtained Continue prenatal vitamins Encouraged well-balanced diet Watched video for carrier screening/genetic testing:  Genetic Screening discussed First Screen: requested Cystic fibrosis screening declined SMA screening requested Fragile X screening requested Ultrasound discussed; fetal survey: requested CCNC completed  Follow-up: Return in about 4 weeks (around 11/24/2018).   Orders Placed This Encounter  Procedures  . Urine Culture  . GC/Chlamydia Probe Amp  . Flu Vaccine QUAD 36+ mos IM   . Obstetric Panel, Including HIV  . Urinalysis, Routine w reflex microscopic  . Sickle cell screen  . SMN1 COPY NUMBER ANALYSIS (SMA Carrier Screen)  . Fragile X, PCR and Southern  . Pain Management Screening Profile (10S)  . MaterniT 21 plus Core, Blood  . POC Urinalysis Dipstick OB    Jacklyn Shell DNP, CNM 10/27/2018 10:29 AM

## 2018-10-27 NOTE — Progress Notes (Signed)
[place] TA/TV:13+4 wks,measurements c/w dates,normal ovaries bilat,posterior placenta,NB present,unable to obtain NT because of fetal position and body habitus,crl 73.12 mm,fhr 161 bpm

## 2018-10-28 LAB — PMP SCREEN PROFILE (10S), URINE
Amphetamine Scrn, Ur: NEGATIVE ng/mL
BARBITURATE SCREEN URINE: NEGATIVE ng/mL
BENZODIAZEPINE SCREEN, URINE: NEGATIVE ng/mL
CANNABINOIDS UR QL SCN: POSITIVE ng/mL — AB
Cocaine (Metab) Scrn, Ur: NEGATIVE ng/mL
Creatinine(Crt), U: 31.1 mg/dL (ref 20.0–300.0)
Methadone Screen, Urine: NEGATIVE ng/mL
OXYCODONE+OXYMORPHONE UR QL SCN: NEGATIVE ng/mL
Opiate Scrn, Ur: NEGATIVE ng/mL
Ph of Urine: 7.7 (ref 4.5–8.9)
Phencyclidine Qn, Ur: NEGATIVE ng/mL
Propoxyphene Scrn, Ur: NEGATIVE ng/mL

## 2018-10-28 LAB — GC/CHLAMYDIA PROBE AMP
Chlamydia trachomatis, NAA: NEGATIVE
Neisseria Gonorrhoeae by PCR: NEGATIVE

## 2018-10-29 LAB — URINE CULTURE

## 2018-10-31 ENCOUNTER — Encounter: Payer: Self-pay | Admitting: Advanced Practice Midwife

## 2018-10-31 ENCOUNTER — Other Ambulatory Visit: Payer: Self-pay | Admitting: Advanced Practice Midwife

## 2018-10-31 DIAGNOSIS — D509 Iron deficiency anemia, unspecified: Secondary | ICD-10-CM | POA: Insufficient documentation

## 2018-10-31 LAB — MATERNIT 21 PLUS CORE, BLOOD
Fetal Fraction: 4
Result (T21): NEGATIVE
Trisomy 13 (Patau syndrome): NEGATIVE
Trisomy 18 (Edwards syndrome): NEGATIVE
Trisomy 21 (Down syndrome): NEGATIVE

## 2018-10-31 MED ORDER — IRON 325 (65 FE) MG PO TABS: 1.0000 | ORAL_TABLET | Freq: Two times a day (BID) | ORAL | 8 refills | Status: DC

## 2018-10-31 NOTE — Progress Notes (Unsigned)
[email or web address] BID

## 2018-11-02 ENCOUNTER — Other Ambulatory Visit: Payer: Self-pay

## 2018-11-02 ENCOUNTER — Encounter: Payer: Self-pay | Admitting: Family

## 2018-11-02 ENCOUNTER — Ambulatory Visit (INDEPENDENT_AMBULATORY_CARE_PROVIDER_SITE_OTHER): Payer: Medicaid Other | Admitting: Family

## 2018-11-02 DIAGNOSIS — H00012 Hordeolum externum right lower eyelid: Secondary | ICD-10-CM

## 2018-11-02 MED ORDER — BACITRACIN-POLYMYXIN B 500-10000 UNIT/GM OP OINT: 1.0000 "application " | TOPICAL_OINTMENT | Freq: Three times a day (TID) | OPHTHALMIC | 0 refills | Status: DC

## 2018-11-02 NOTE — Progress Notes (Signed)
   Virtual Visit via telephone Note  I connected with Sierra Heath on 11/02/18 at 2:00 pm by telephone and verified that I am speaking with the correct person using two identifiers. Sierra Heath is currently located at home and no one is currently with her during visit. The provider, Jannifer Rodney, FNP is located in their office at time of visit.  I discussed the limitations, risks, security and privacy concerns of performing an evaluation and management service by telephone and the availability of in person appointments. I also discussed with the patient that there may be a patient responsible charge related to this service. The patient expressed understanding and agreed to proceed.   History and Present Illness:    HPI PT calls the office today with complaints of a stye of right lower eye lid that she noticed two weeks ago. States at first it was erythemas and then started draining a small amount of white discharge. She states she has tried warm compresses with mild relief.   Denies any pain, fever, or changes in her vision.    Review of Systems  Eyes: Positive for itching (improved). Negative for photophobia, pain, discharge, redness and visual disturbance.  All other systems reviewed and are negative.        Observations/Objective: No SOB or distress noted  Assessment and Plan: 1. Hordeolum externum of right lower eyelid Warm compresses Do not rub eye Call office if area does not improve or worsens - bacitracin-polymyxin b (POLYSPORIN) ophthalmic ointment; Place 1 application into the right eye 3 (three) times daily.  Dispense: 3.5 g; Refill: 0      I discussed the assessment and treatment plan with the patient. The patient was provided an opportunity to ask questions and all were answered. The patient agreed with the plan and demonstrated an understanding of the instructions.   The patient was advised to call back or seek an in-person evaluation if the symptoms  worsen or if the condition fails to improve as anticipated.  The above assessment and management plan was discussed with the patient. The patient verbalized understanding of and has agreed to the management plan. Patient is aware to call the clinic if symptoms persist or worsen. Patient is aware when to return to the clinic for a follow-up visit. Patient educated on when it is appropriate to go to the emergency department.    Call ended 2:12,  I provided 12 minutes of non-face-to-face time during this encounter.    Jannifer Rodney, FNP

## 2018-11-08 LAB — OBSTETRIC PANEL, INCLUDING HIV
Antibody Screen: NEGATIVE
Basophils Absolute: 0 10*3/uL (ref 0.0–0.2)
Basos: 0 %
EOS (ABSOLUTE): 0 10*3/uL (ref 0.0–0.4)
Eos: 0 %
HIV Screen 4th Generation wRfx: NONREACTIVE
Hematocrit: 27.4 % — ABNORMAL LOW (ref 34.0–46.6)
Hemoglobin: 7.9 g/dL — ABNORMAL LOW (ref 11.1–15.9)
Hepatitis B Surface Ag: NEGATIVE
Immature Grans (Abs): 0 10*3/uL (ref 0.0–0.1)
Immature Granulocytes: 0 %
Lymphocytes Absolute: 1.5 10*3/uL (ref 0.7–3.1)
Lymphs: 19 %
MCH: 18.4 pg — ABNORMAL LOW (ref 26.6–33.0)
MCHC: 28.8 g/dL — ABNORMAL LOW (ref 31.5–35.7)
MCV: 64 fL — ABNORMAL LOW (ref 79–97)
Monocytes Absolute: 0.4 10*3/uL (ref 0.1–0.9)
Monocytes: 5 %
Neutrophils Absolute: 5.8 10*3/uL (ref 1.4–7.0)
Neutrophils: 76 %
Platelets: 282 10*3/uL (ref 150–450)
RBC: 4.29 x10E6/uL (ref 3.77–5.28)
RDW: 19.7 % — ABNORMAL HIGH (ref 11.7–15.4)
RPR Ser Ql: NONREACTIVE
Rh Factor: POSITIVE
Rubella Antibodies, IGG: <0.9 index — ABNORMAL LOW (ref >0.99)
WBC: 7.7 10*3/uL (ref 3.4–10.8)

## 2018-11-08 LAB — MICROSCOPIC EXAMINATION: Casts: NONE SEEN /lpf

## 2018-11-08 LAB — SMN1 COPY NUMBER ANALYSIS (SMA CARRIER SCREENING)

## 2018-11-08 LAB — URINALYSIS, ROUTINE W REFLEX MICROSCOPIC
Bilirubin, UA: NEGATIVE
Glucose, UA: NEGATIVE
Ketones, UA: NEGATIVE
Nitrite, UA: NEGATIVE
Protein,UA: NEGATIVE
RBC, UA: NEGATIVE
Specific Gravity, UA: 1.008 (ref 1.005–1.030)
Urobilinogen, Ur: 0.2 mg/dL (ref 0.2–1.0)
pH, UA: 8.5 — ABNORMAL HIGH (ref 5.0–7.5)

## 2018-11-08 LAB — SICKLE CELL SCREEN: Sickle Cell Screen: NEGATIVE

## 2018-11-15 NOTE — Progress Notes (Signed)
REVIEWED-NO ADDITIONAL RECOMMENDATIONS. 

## 2018-11-24 ENCOUNTER — Encounter: Payer: Medicaid Other | Admitting: Obstetrics and Gynecology

## 2018-11-29 ENCOUNTER — Encounter: Payer: Self-pay | Admitting: *Deleted

## 2018-11-29 ENCOUNTER — Other Ambulatory Visit: Payer: Self-pay | Admitting: Advanced Practice Midwife

## 2018-11-29 DIAGNOSIS — Z363 Encounter for antenatal screening for malformations: Secondary | ICD-10-CM

## 2018-11-30 ENCOUNTER — Encounter: Payer: Medicaid Other | Admitting: Obstetrics & Gynecology

## 2018-11-30 ENCOUNTER — Ambulatory Visit (INDEPENDENT_AMBULATORY_CARE_PROVIDER_SITE_OTHER): Payer: Medicaid Other

## 2018-11-30 ENCOUNTER — Other Ambulatory Visit: Payer: Self-pay

## 2018-11-30 DIAGNOSIS — Z363 Encounter for antenatal screening for malformations: Secondary | ICD-10-CM

## 2018-11-30 DIAGNOSIS — Z3402 Encounter for supervision of normal first pregnancy, second trimester: Secondary | ICD-10-CM

## 2018-11-30 NOTE — Progress Notes (Signed)
[place] 18+3 wks,breech,cx 5.2 cm,posterior placenta gr 0,normal ovaries bilat,svp of fluid 4.9 cm,fhr 159 bpm,efw 242 g 46%,limited view of heart,limited ultrasound because of pt body habitus,please repeat anatomy scan

## 2018-12-01 ENCOUNTER — Encounter: Payer: Medicaid Other | Admitting: Women's Health

## 2018-12-03 ENCOUNTER — Ambulatory Visit: Payer: Medicaid Other

## 2018-12-03 ENCOUNTER — Other Ambulatory Visit: Payer: Self-pay

## 2018-12-06 ENCOUNTER — Ambulatory Visit (INDEPENDENT_AMBULATORY_CARE_PROVIDER_SITE_OTHER): Payer: Medicaid Other | Admitting: Women's Health

## 2018-12-06 ENCOUNTER — Ambulatory Visit (INDEPENDENT_AMBULATORY_CARE_PROVIDER_SITE_OTHER): Payer: Medicaid Other | Admitting: *Deleted

## 2018-12-06 ENCOUNTER — Other Ambulatory Visit: Payer: Self-pay

## 2018-12-06 ENCOUNTER — Encounter: Payer: Self-pay | Admitting: Women's Health

## 2018-12-06 ENCOUNTER — Telehealth: Payer: Self-pay | Admitting: Women's Health

## 2018-12-06 VITALS — BP 134/62 | HR 89 | Wt 311.6 lb

## 2018-12-06 DIAGNOSIS — Z3482 Encounter for supervision of other normal pregnancy, second trimester: Secondary | ICD-10-CM | POA: Diagnosis not present

## 2018-12-06 DIAGNOSIS — F32A Depression, unspecified: Secondary | ICD-10-CM

## 2018-12-06 DIAGNOSIS — F419 Anxiety disorder, unspecified: Secondary | ICD-10-CM

## 2018-12-06 DIAGNOSIS — Z111 Encounter for screening for respiratory tuberculosis: Secondary | ICD-10-CM | POA: Diagnosis not present

## 2018-12-06 DIAGNOSIS — O99012 Anemia complicating pregnancy, second trimester: Secondary | ICD-10-CM

## 2018-12-06 DIAGNOSIS — F329 Major depressive disorder, single episode, unspecified: Secondary | ICD-10-CM

## 2018-12-06 DIAGNOSIS — Z362 Encounter for other antenatal screening follow-up: Secondary | ICD-10-CM

## 2018-12-06 DIAGNOSIS — Z98891 History of uterine scar from previous surgery: Secondary | ICD-10-CM

## 2018-12-06 DIAGNOSIS — Z23 Encounter for immunization: Secondary | ICD-10-CM

## 2018-12-06 DIAGNOSIS — Z3A19 19 weeks gestation of pregnancy: Secondary | ICD-10-CM

## 2018-12-06 DIAGNOSIS — O99342 Other mental disorders complicating pregnancy, second trimester: Secondary | ICD-10-CM

## 2018-12-06 LAB — POCT URINALYSIS DIPSTICK OB
Blood, UA: NEGATIVE
Glucose, UA: NEGATIVE
Ketones, UA: NEGATIVE
Leukocytes, UA: NEGATIVE
Nitrite, UA: NEGATIVE
POC,PROTEIN,UA: NEGATIVE

## 2018-12-06 MED ORDER — ESCITALOPRAM OXALATE 10 MG PO TABS: 10.0000 mg | ORAL_TABLET | Freq: Every day | ORAL | 6 refills | Status: DC

## 2018-12-06 MED ORDER — BLOOD PRESSURE MONITOR MISC: 0 refills | Status: DC

## 2018-12-06 NOTE — Addendum Note (Signed)
Addended by: Sherre Lain A on: 12/06/2018 03:39 PM   Modules accepted: Orders

## 2018-12-06 NOTE — Progress Notes (Signed)
LOW-RISK PREGNANCY VISIT Patient name: Sierra Heath MRN [id number]  Date of birth: [birth year] Chief Complaint:   Routine Prenatal Visit  History of Present Illness:   Sierra Heath is a 28 y.o. G10P2002 female at [redacted]w[redacted]d with an Estimated Date of Delivery: 04/30/19 being seen today for ongoing management of a low-risk pregnancy.  Today she reports depression, was on wellbutrin for very short period pre-pregnancy, quit w/ +PT. Denies SI/HI, wants meds.Doesn't want to do pap today, stomach is hurting like she needs to have a bm.  Hasn't been taking fe bid as directed, takes when she remembers. Contractions: Not present. Vag. Bleeding: None.  Movement: Present. denies leaking of fluid. Review of Systems:   Pertinent items are noted in HPI Denies abnormal vaginal discharge w/ itching/odor/irritation, headaches, visual changes, shortness of breath, chest pain, abdominal pain, severe nausea/vomiting, or problems with urination or bowel movements unless otherwise stated above. Pertinent History Reviewed:  Reviewed past medical,surgical, social, obstetrical and family history.  Reviewed problem list, medications and allergies. Physical Assessment:   Vitals:   12/06/18 1048 12/06/18 1137  BP: (!) 143/62 134/62  Pulse: 89   Weight: (!) 311 lb 9.6 oz (141.3 kg)   Body mass index is 50.29 kg/m.        Physical Examination:   General appearance: Well appearing, and in no distress  Mental status: Alert, oriented to person, place, and time  Skin: Warm & dry  Cardiovascular: Normal heart rate noted  Respiratory: Normal respiratory effort, no distress  Abdomen: Soft, gravid, nontender  Pelvic: Cervical exam deferred         Extremities: Edema: Trace  Fetal Status: Fetal Heart Rate (bpm): +u/s   Movement: Present    No results found for this or any previous visit (from the past 24 hour(s)).  Assessment & Plan:  1) Low-risk pregnancy G3P2002 at [redacted]w[redacted]d with an Estimated Date of Delivery:  04/30/19   2) Initally elevated bp, normal on recheck, doesn't have home bp cuff, rx faxed to [facility], check QID for a little while, then weekly if all normal, let us know if consistently >140/90  3) Anemia> last hgb 7.9 @ 14wks, not taking fe bid as directed, recheck cbc today, take fe bid w/ OJ  4) Prev c/s x 2> for RCS  5) Dep/anx> rx Lexapro 10mg  daily, understands can take a few weeks to notice improvement   Meds:  Meds ordered this encounter  Medications  . Blood Pressure Monitor MISC    Sig: For regular home bp monitoring during pregnancy    Dispense:  1 each    Refill:  0    Dx: z34.90    Order Specific Question:   Supervising Provider    Answer:   Despina Hidden, LUTHER H [[id number]]  . escitalopram (LEXAPRO) 10 MG tablet    Sig: Take 1 tablet (10 mg total) by mouth daily.    Dispense:  30 tablet    Refill:  6    Order Specific Question:   Supervising Provider    Answer:   Lazaro Arms [[id number]]   Labs/procedures today: cbc  Plan:  Continue routine obstetrical care   Reviewed: Preterm labor symptoms and general obstetric precautions including but not limited to vaginal bleeding, contractions, leaking of fluid and fetal movement were reviewed in detail with the patient.  All questions were answered  Follow-up: Return for asap for anatomy f/u u/s (no visit), then 4wks for LROB webex.  Orders Placed This Encounter  Procedures  . US OB Follow Up  . CBC   Cheral MarkerKimberly R Maybel Dambrosio CNM, [facility] M[facility] HospitalWHNP-BC 12/06/2018 12:55 PM

## 2018-12-06 NOTE — Progress Notes (Signed)
PPD placed L forearm Pt tolerated well 

## 2018-12-06 NOTE — Patient Instructions (Signed)
[name], I greatly value your feedback.  If you receive a survey following your visit with [place] today, we appreciate you taking the time to fill it out.  Thanks, Joellyn Haff, CNM, [facility]  [facility] H[facility] HAS MOVED!!! It is now [facility] ([address] [place], [place] [address]) Entrance located off of [address] Free 24/7 valet parking    Home Blood Pressure Monitoring for Patients   Your provider has recommended that you check your blood pressure (BP) at least once a week at home. If you do not have a blood pressure cuff at home, one will be provided for you. Contact your provider if you have not received your monitor within 1 week.   Helpful Tips for Accurate Home Blood Pressure Checks  . Don't smoke, exercise, or drink caffeine 30 minutes before checking your BP . Use the restroom before checking your BP (a full bladder can raise your pressure) . Relax in a comfortable upright chair . Feet on the ground . Left arm resting comfortably on a flat surface at the level of your heart . Legs uncrossed . Back supported . Sit quietly and don't talk . Place the cuff on your bare arm . Adjust snuggly, so that only two fingertips can fit between your skin and the top of the cuff . Check 2 readings separated by at least one minute . Keep a log of your BP readings . For a visual, please reference this diagram: http://ccnc.care/bpdiagram  Provider Name: [facility]     Phone: [phone]  Zone 1: ALL CLEAR  Continue to monitor your symptoms:  . BP reading is less than 140 (top number) or less than 90 (bottom number)  . No right upper stomach pain . No headaches or seeing spots . No feeling nauseated or throwing up . No swelling in face and hands  Zone 2: CAUTION Call your doctor's office for any of the following:  . BP reading is greater than 140 (top number) or greater than 90 (bottom number)  . Stomach pain under your ribs in the  middle or right side . Headaches or seeing spots . Feeling nauseated or throwing up . Swelling in face and hands  Zone 3: EMERGENCY  Seek immediate medical care if you have any of the following:  . BP reading is greater than160 (top number) or greater than 110 (bottom number) . Severe headaches not improving with Tylenol . Serious difficulty catching your breath . Any worsening symptoms from Zone 2   Second Trimester of Pregnancy The second trimester is from week 14 through week 27 (months 4 through 6). The second trimester is often a time when you feel your best. Your body has adjusted to being pregnant, and you begin to feel better physically. Usually, morning sickness has lessened or quit completely, you may have more energy, and you may have an increase in appetite. The second trimester is also a time when the fetus is growing rapidly. At the end of the sixth month, the fetus is about 9 inches long and weighs about 1 pounds. You will likely begin to feel the baby move (quickening) between 16 and 20 weeks of pregnancy. Body changes during your second trimester Your body continues to go through many changes during your second trimester. The changes vary from woman to woman.  Your weight will continue to increase. You will notice your lower abdomen bulging out.  You may begin to get stretch  marks on your hips, abdomen, and breasts.  You may develop headaches that can be relieved by medicines. The medicines should be approved by your health care provider.  You may urinate more often because the fetus is pressing on your bladder.  You may develop or continue to have heartburn as a result of your pregnancy.  You may develop constipation because certain hormones are causing the muscles that push waste through your intestines to slow down.  You may develop hemorrhoids or swollen, bulging veins (varicose veins).  You may have back pain. This is caused by: ? Weight gain. ? Pregnancy  hormones that are relaxing the joints in your pelvis. ? A shift in weight and the muscles that support your balance.  Your breasts will continue to grow and they will continue to become tender.  Your gums may bleed and may be sensitive to brushing and flossing.  Dark spots or blotches (chloasma, mask of pregnancy) may develop on your face. This will likely fade after the baby is born.  A dark line from your belly button to the pubic area (linea nigra) may appear. This will likely fade after the baby is born.  You may have changes in your hair. These can include thickening of your hair, rapid growth, and changes in texture. Some women also have hair loss during or after pregnancy, or hair that feels dry or thin. Your hair will most likely return to normal after your baby is born.  What to expect at prenatal visits During a routine prenatal visit:  You will be weighed to make sure you and the fetus are growing normally.  Your blood pressure will be taken.  Your abdomen will be measured to track your baby's growth.  The fetal heartbeat will be listened to.  Any test results from the previous visit will be discussed.  Your health care provider may ask you:  How you are feeling.  If you are feeling the baby move.  If you have had any abnormal symptoms, such as leaking fluid, bleeding, severe headaches, or abdominal cramping.  If you are using any tobacco products, including cigarettes, chewing tobacco, and electronic cigarettes.  If you have any questions.  Other tests that may be performed during your second trimester include:  Blood tests that check for: ? Low iron levels (anemia). ? High blood sugar that affects pregnant women (gestational diabetes) between 21 and 28 weeks. ? Rh antibodies. This is to check for a protein on red blood cells (Rh factor).  Urine tests to check for infections, diabetes, or protein in the urine.  An ultrasound to confirm the proper growth and  development of the baby.  An amniocentesis to check for possible genetic problems.  Fetal screens for spina bifida and Down syndrome.  HIV (human immunodeficiency virus) testing. Routine prenatal testing includes screening for HIV, unless you choose not to have this test.  Follow these instructions at home: Medicines  Follow your health care provider's instructions regarding medicine use. Specific medicines may be either safe or unsafe to take during pregnancy.  Take a prenatal vitamin that contains at least 600 micrograms (mcg) of folic acid.  If you develop constipation, try taking a stool softener if your health care provider approves. Eating and drinking  Eat a balanced diet that includes fresh fruits and vegetables, whole grains, good sources of protein such as meat, eggs, or tofu, and low-fat dairy. Your health care provider will help you determine the amount of weight gain that is  right for you.  Avoid raw meat and uncooked cheese. These carry germs that can cause birth defects in the baby.  If you have low calcium intake from food, talk to your health care provider about whether you should take a daily calcium supplement.  Limit foods that are high in fat and processed sugars, such as fried and sweet foods.  To prevent constipation: ? Drink enough fluid to keep your urine clear or pale yellow. ? Eat foods that are high in fiber, such as fresh fruits and vegetables, whole grains, and beans. Activity  Exercise only as directed by your health care provider. Most women can continue their usual exercise routine during pregnancy. Try to exercise for 30 minutes at least 5 days a week. Stop exercising if you experience uterine contractions.  Avoid heavy lifting, wear low heel shoes, and practice good posture.  A sexual relationship may be continued unless your health care provider directs you otherwise. Relieving pain and discomfort  Wear a good support bra to prevent discomfort  from breast tenderness.  Take warm sitz baths to soothe any pain or discomfort caused by hemorrhoids. Use hemorrhoid cream if your health care provider approves.  Rest with your legs elevated if you have leg cramps or low back pain.  If you develop varicose veins, wear support hose. Elevate your feet for 15 minutes, 3-4 times a day. Limit salt in your diet. Prenatal Care  Write down your questions. Take them to your prenatal visits.  Keep all your prenatal visits as told by your health care provider. This is important. Safety  Wear your seat belt at all times when driving.  Make a list of emergency phone numbers, including numbers for family, friends, the hospital, and police and fire departments. General instructions  Ask your health care provider for a referral to a local prenatal education class. Begin classes no later than the beginning of month 6 of your pregnancy.  Ask for help if you have counseling or nutritional needs during pregnancy. Your health care provider can offer advice or refer you to specialists for help with various needs.  Do not use hot tubs, steam rooms, or saunas.  Do not douche or use tampons or scented sanitary pads.  Do not cross your legs for long periods of time.  Avoid cat litter boxes and soil used by cats. These carry germs that can cause birth defects in the baby and possibly loss of the fetus by miscarriage or stillbirth.  Avoid all smoking, herbs, alcohol, and unprescribed drugs. Chemicals in these products can affect the formation and growth of the baby.  Do not use any products that contain nicotine or tobacco, such as cigarettes and e-cigarettes. If you need help quitting, ask your health care provider.  Visit your dentist if you have not gone yet during your pregnancy. Use a soft toothbrush to brush your teeth and be gentle when you floss. Contact a health care provider if:  You have dizziness.  You have mild pelvic cramps, pelvic  pressure, or nagging pain in the abdominal area.  You have persistent nausea, vomiting, or diarrhea.  You have a bad smelling vaginal discharge.  You have pain when you urinate. Get help right away if:  You have a fever.  You are leaking fluid from your vagina.  You have spotting or bleeding from your vagina.  You have severe abdominal cramping or pain.  You have rapid weight gain or weight loss.  You have shortness of breath with  chest pain.  You notice sudden or extreme swelling of your face, hands, ankles, feet, or legs.  You have not felt your baby move in over an hour.  You have severe headaches that do not go away when you take medicine.  You have vision changes. Summary  The second trimester is from week 14 through week 27 (months 4 through 6). It is also a time when the fetus is growing rapidly.  Your body goes through many changes during pregnancy. The changes vary from woman to woman.  Avoid all smoking, herbs, alcohol, and unprescribed drugs. These chemicals affect the formation and growth your baby.  Do not use any tobacco products, such as cigarettes, chewing tobacco, and e-cigarettes. If you need help quitting, ask your health care provider.  Contact your health care provider if you have any questions. Keep all prenatal visits as told by your health care provider. This is important. This information is not intended to replace advice given to you by your health care provider. Make sure you discuss any questions you have with your health care provider. Document Released: 07/01/2001 Document Revised: 12/13/2015 Document Reviewed: 09/07/2012 Elsevier Interactive Patient Education  2017 Elsevier Inc.  Coronavirus (COVID-19) Are you at risk?  Are you at risk for the Coronavirus (COVID-19)?  To be considered HIGH RISK for Coronavirus (COVID-19), you have to meet the following criteria:  . Traveled to [place], [place], [place], [place] or [place]; or in the [place] to [place], [place], [place], or [place]; and have fever, cough, and shortness of breath within the last 2 weeks of travel OR . Been in close contact with a person diagnosed with COVID-19 within the last 2 weeks and have fever, cough, and shortness of breath . IF YOU DO NOT MEET THESE CRITERIA, YOU ARE CONSIDERED LOW RISK FOR COVID-19.  What to do if you are HIGH RISK for COVID-19?  Marland Kitchen. If you are having a medical emergency, call 911. . Seek medical care right away. Before you go to a doctor's office, urgent care or emergency department, call ahead and tell them about your recent travel, contact with someone diagnosed with COVID-19, and your symptoms. You should receive instructions from your physician's office regarding next steps of care.  . When you arrive at healthcare provider, tell the healthcare staff immediately you have returned from visiting [place], [place], [place], [place] or [place]; or traveled in the [place] to [place], [place], [place], or [place]; in the last two weeks or you have been in close contact with a person diagnosed with COVID-19 in the last 2 weeks.   . Tell the health care staff about your symptoms: fever, cough and shortness of breath. . After you have been seen by a medical provider, you will be either: o Tested for (COVID-19) and discharged home on quarantine except to seek medical care if symptoms worsen, and asked to  - Stay home and avoid contact with others until you get your results (4-5 days)  - Avoid travel on public transportation if possible (such as bus, train, or airplane) or o Sent to the Emergency Department by EMS for evaluation, COVID-19 testing, and possible admission depending on your condition and test results.  What to do if you are LOW RISK for COVID-19?  Reduce your risk of any infection by using the same precautions used for avoiding the common cold or flu:  Marland Kitchen. Wash your hands often with soap and warm water for at  least 20 seconds.  If soap and water are not readily available, use an alcohol-based hand sanitizer with at least 60% alcohol.  . If coughing or sneezing, cover your mouth and nose by coughing or sneezing into the elbow areas of your shirt or coat, into a tissue or into your sleeve (not your hands). . Avoid shaking hands with others and consider head nods or verbal greetings only. . Avoid touching your eyes, nose, or mouth with unwashed hands.  . Avoid close contact with people who are sick. . Avoid places or events with large numbers of people in one location, like concerts or sporting events. . Carefully consider travel plans you have or are making. . If you are planning any travel outside or inside the [place], visit the CDC's Travelers' Health webpage for the latest health notices. . If you have some symptoms but not all symptoms, continue to monitor at home and seek medical attention if your symptoms worsen. . If you are having a medical emergency, call 911.   ADDITIONAL HEALTHCARE OPTIONS FOR PATIENTS  [place] [facility] / e-Visit: [email or web address]         [facility]: [phone]  [facility]: [phone]                   [facility]: [phone]

## 2018-12-06 NOTE — Telephone Encounter (Signed)
Pt states when she got home for her appt today her urine burned when she went to the restroom. She would like to see if the urine sample she left for today can be tested for a UTI.

## 2018-12-07 ENCOUNTER — Other Ambulatory Visit: Payer: Self-pay | Admitting: Women's Health

## 2018-12-07 LAB — CBC
Hematocrit: 27.3 % — ABNORMAL LOW (ref 34.0–46.6)
Hemoglobin: 7.9 g/dL — ABNORMAL LOW (ref 11.1–15.9)
MCH: 18.9 pg — ABNORMAL LOW (ref 26.6–33.0)
MCHC: 28.9 g/dL — ABNORMAL LOW (ref 31.5–35.7)
MCV: 65 fL — ABNORMAL LOW (ref 79–97)
Platelets: 258 10*3/uL (ref 150–450)
RBC: 4.19 x10E6/uL (ref 3.77–5.28)
RDW: 19.3 % — ABNORMAL HIGH (ref 11.7–15.4)
WBC: 8.7 10*3/uL (ref 3.4–10.8)

## 2018-12-07 NOTE — Addendum Note (Signed)
Addended by: Cheral Marker on: 12/07/2018 10:24 PM   Modules accepted: Orders

## 2018-12-08 ENCOUNTER — Telehealth: Payer: Self-pay | Admitting: Women's Health

## 2018-12-08 LAB — URINE CULTURE

## 2018-12-08 LAB — TB SKIN TEST
Induration: 0 mm
TB Skin Test: NEGATIVE

## 2018-12-08 NOTE — Telephone Encounter (Signed)
Pt has a few questions about the iron infusion she is having tomorrow at [facility] and also someone was to get in touch with her about a BP cuff

## 2018-12-08 NOTE — Telephone Encounter (Signed)
Spoke with patient she is aware of infusion tomorrow. Advised the BP cuff can take a few days but I needed to call them today anyways so I will check for her. No other questions at this time.

## 2018-12-09 ENCOUNTER — Encounter (HOSPITAL_COMMUNITY): Payer: Self-pay

## 2018-12-09 ENCOUNTER — Other Ambulatory Visit: Payer: Self-pay

## 2018-12-09 ENCOUNTER — Encounter (HOSPITAL_COMMUNITY)
Admission: RE | Admit: 2018-12-09 | Discharge: 2018-12-09 | Disposition: A | Discharge status: Home / Self Care | Payer: Medicaid Other | Source: Ambulatory Visit | Attending: Obstetrics & Gynecology | Admitting: Obstetrics & Gynecology

## 2018-12-09 DIAGNOSIS — O99012 Anemia complicating pregnancy, second trimester: Secondary | ICD-10-CM | POA: Insufficient documentation

## 2018-12-09 DIAGNOSIS — Z3A00 Weeks of gestation of pregnancy not specified: Secondary | ICD-10-CM | POA: Diagnosis not present

## 2018-12-09 MED ORDER — SODIUM CHLORIDE 0.9 % IV SOLN
510.0000 mg | INTRAVENOUS | Status: DC
  Administered 2018-12-09: 13:00:00 510 mg via INTRAVENOUS
  Filled 2018-12-09: qty 17

## 2018-12-09 MED ORDER — SODIUM CHLORIDE 0.9 % IV SOLN
Freq: Once | INTRAVENOUS | Status: AC
  Administered 2018-12-09: 13:00:00 via INTRAVENOUS

## 2018-12-09 NOTE — Discharge Instructions (Signed)

## 2018-12-16 ENCOUNTER — Encounter (HOSPITAL_COMMUNITY)
Admission: RE | Admit: 2018-12-16 | Discharge: 2018-12-16 | Disposition: A | Discharge status: Home / Self Care | Payer: Medicaid Other | Source: Ambulatory Visit | Attending: Obstetrics & Gynecology | Admitting: Obstetrics & Gynecology

## 2018-12-16 ENCOUNTER — Other Ambulatory Visit: Payer: Self-pay

## 2018-12-16 DIAGNOSIS — Z3A00 Weeks of gestation of pregnancy not specified: Secondary | ICD-10-CM | POA: Diagnosis not present

## 2018-12-16 DIAGNOSIS — O99012 Anemia complicating pregnancy, second trimester: Secondary | ICD-10-CM | POA: Diagnosis not present

## 2018-12-16 DIAGNOSIS — Z3490 Encounter for supervision of normal pregnancy, unspecified, unspecified trimester: Secondary | ICD-10-CM | POA: Diagnosis not present

## 2018-12-16 MED ORDER — SODIUM CHLORIDE 0.9 % IV SOLN
INTRAVENOUS | Status: DC
  Administered 2018-12-16: 10:00:00 via INTRAVENOUS

## 2018-12-16 MED ORDER — SODIUM CHLORIDE 0.9 % IV SOLN
510.0000 mg | Freq: Once | INTRAVENOUS | Status: AC
  Administered 2018-12-16: 10:00:00 510 mg via INTRAVENOUS
  Filled 2018-12-16 (×2): qty 17

## 2018-12-20 ENCOUNTER — Other Ambulatory Visit: Payer: Medicaid Other

## 2018-12-31 ENCOUNTER — Encounter: Payer: Self-pay | Admitting: *Deleted

## 2019-01-03 ENCOUNTER — Other Ambulatory Visit: Payer: Self-pay

## 2019-01-03 ENCOUNTER — Ambulatory Visit (INDEPENDENT_AMBULATORY_CARE_PROVIDER_SITE_OTHER): Payer: Medicaid Other | Admitting: Advanced Practice Midwife

## 2019-01-03 ENCOUNTER — Encounter: Payer: Self-pay | Admitting: Advanced Practice Midwife

## 2019-01-03 DIAGNOSIS — Z3482 Encounter for supervision of other normal pregnancy, second trimester: Secondary | ICD-10-CM

## 2019-01-03 DIAGNOSIS — Z3A23 23 weeks gestation of pregnancy: Secondary | ICD-10-CM

## 2019-01-03 NOTE — Progress Notes (Signed)
   TELEHEALTH VIRTUAL OBSTETRICS VISIT ENCOUNTER NOTE  I connected with Sierra Heath on 01/03/19 at  9:00 AM EDT by telephone at home and verified that I am speaking with the correct person using two identifiers.   I discussed the limitations, risks, security and privacy concerns of performing an evaluation and management service by telephone and the availability of in person appointments. I also discussed with the patient that there may be a patient responsible charge related to this service. The patient expressed understanding and agreed to proceed.  Subjective:  Sierra Heath is a 28 y.o. G3P2002 at [redacted]w[redacted]d being followed for ongoing prenatal care.  She is currently monitored for the following issues for this low-risk pregnancy and has History of cesarean section; Morbid obesity ([place]); Sleep apnea; Anxiety and depression; Migraine headache without aura; Supervision of normal pregnancy; and Iron deficiency anemia on their problem list.  Patient reports no complaints   SBP has been >140 several times, but DBP normal. Probably going to have to treat as CHTN.  Denies contractions, bleeding or leaking of fluid.   The following portions of the patient's history were reviewed and updated as appropriate: allergies, current medications, past family history, past medical history, past social history, past surgical history and problem list.   Objective:   General:  Alert, oriented and cooperative.   Mental Status: Normal mood and affect perceived. Normal judgment and thought content.  Rest of physical exam deferred due to type of encounter  Assessment and Plan:  Pregnancy: G3P2002 at [redacted]w[redacted]d There are no diagnoses linked to this encounter. Preterm labor symptoms and general obstetric precautions including but not limited to vaginal bleeding, contractions, leaking of fluid and fetal movement were reviewed in detail with the patient.  I discussed the assessment and treatment plan with the patient.  The patient was provided an opportunity to ask questions and all were answered. The patient agreed with the plan and demonstrated an understanding of the instructions. The patient was advised to call back or seek an in-person office evaluation/go to [facility]U at [facility] for any urgent or concerning symptoms. Please refer to After Visit Summary for other counseling recommendations.   I provided 13 minutes of non-face-to-face time during this encounter.  F/U 1 month for PN2/pap/see [facility] per request (wants him to do her CS)  Future Appointments  Date Time Provider [place]  01/12/2019  3:15 PM [facility] [place] [facility] None    Christin Fudge, [place] for [employer], [place]

## 2019-01-03 NOTE — Patient Instructions (Signed)

## 2019-01-05 ENCOUNTER — Other Ambulatory Visit: Payer: Medicaid Other

## 2019-01-11 ENCOUNTER — Telehealth: Payer: Self-pay | Admitting: *Deleted

## 2019-01-11 NOTE — Telephone Encounter (Signed)
125/91 Bp pt # [id number]

## 2019-01-12 ENCOUNTER — Ambulatory Visit (INDEPENDENT_AMBULATORY_CARE_PROVIDER_SITE_OTHER): Payer: Medicaid Other

## 2019-01-12 ENCOUNTER — Other Ambulatory Visit: Payer: Self-pay

## 2019-01-12 DIAGNOSIS — Z362 Encounter for other antenatal screening follow-up: Secondary | ICD-10-CM

## 2019-01-12 DIAGNOSIS — Z3A24 24 weeks gestation of pregnancy: Secondary | ICD-10-CM | POA: Diagnosis not present

## 2019-01-12 DIAGNOSIS — Z3482 Encounter for supervision of other normal pregnancy, second trimester: Secondary | ICD-10-CM

## 2019-01-12 NOTE — Progress Notes (Signed)
[place] 24+4 wks,breech,fhr 150 bpm,cx 4.8 cm,svp of fluid 6 cm,fundal placenta gr 0,bilat adnexa's wnl,efw 804 g 76%,limited ultrasound because of pt body habitus,limited views of the heart,no obvious abnormalities

## 2019-01-18 ENCOUNTER — Telehealth: Payer: Self-pay | Admitting: *Deleted

## 2019-01-18 NOTE — Telephone Encounter (Signed)
Tell her I looked at them  They are certainly bordrline, but we already know she has CHTN, they are not to the point we would add medication, we don't do that until 160/105 range consistently

## 2019-01-18 NOTE — Telephone Encounter (Signed)
[facility] has called several times regarding her BP's.  She is not going to be seen again until 7/14.  Can you review these readings please and let me know if she needs anything? You will need to go under chart review, then encounters, then scroll down to "OB Home Device Data". Click on the right side by flowsheets on the" blue data"   Thanks.

## 2019-01-20 ENCOUNTER — Other Ambulatory Visit: Payer: Self-pay

## 2019-01-20 ENCOUNTER — Other Ambulatory Visit: Payer: Medicaid Other

## 2019-01-20 DIAGNOSIS — D509 Iron deficiency anemia, unspecified: Secondary | ICD-10-CM

## 2019-01-20 NOTE — Progress Notes (Signed)
Patient informed repeat CBC is needed post Feraheme.. Will come this afternoon.

## 2019-01-21 LAB — CBC
Hematocrit: 33.6 % — ABNORMAL LOW (ref 34.0–46.6)
Hemoglobin: 10.5 g/dL — ABNORMAL LOW (ref 11.1–15.9)
MCH: 24.9 pg — ABNORMAL LOW (ref 26.6–33.0)
MCHC: 31.3 g/dL — ABNORMAL LOW (ref 31.5–35.7)
MCV: 80 fL (ref 79–97)
Platelets: 232 10*3/uL (ref 150–450)
RBC: 4.21 x10E6/uL (ref 3.77–5.28)
WBC: 9.6 10*3/uL (ref 3.4–10.8)

## 2019-01-25 ENCOUNTER — Telehealth: Payer: Self-pay | Admitting: Obstetrics & Gynecology

## 2019-01-25 ENCOUNTER — Encounter: Payer: Self-pay | Admitting: *Deleted

## 2019-01-25 ENCOUNTER — Encounter: Payer: Self-pay | Admitting: Women's Health

## 2019-01-25 ENCOUNTER — Telehealth: Payer: Self-pay | Admitting: *Deleted

## 2019-01-25 DIAGNOSIS — O10919 Unspecified pre-existing hypertension complicating pregnancy, unspecified trimester: Secondary | ICD-10-CM | POA: Insufficient documentation

## 2019-01-25 NOTE — Telephone Encounter (Signed)
[facility] called to report a High Blood pressure reading 157/84. Please contact pt

## 2019-01-25 NOTE — Telephone Encounter (Signed)
Unable to leave VM. mychart message sent.

## 2019-02-01 ENCOUNTER — Other Ambulatory Visit (HOSPITAL_COMMUNITY)
Admission: RE | Admit: 2019-02-01 | Discharge: 2019-02-01 | Disposition: A | Discharge status: Home / Self Care | Payer: Medicaid Other | Source: Ambulatory Visit | Attending: Obstetrics & Gynecology | Admitting: Obstetrics & Gynecology

## 2019-02-01 ENCOUNTER — Other Ambulatory Visit: Payer: Medicaid Other

## 2019-02-01 ENCOUNTER — Ambulatory Visit (INDEPENDENT_AMBULATORY_CARE_PROVIDER_SITE_OTHER): Payer: Medicaid Other | Admitting: Obstetrics & Gynecology

## 2019-02-01 ENCOUNTER — Other Ambulatory Visit: Payer: Self-pay

## 2019-02-01 VITALS — BP 119/59 | HR 85 | Wt 324.0 lb

## 2019-02-01 DIAGNOSIS — Z124 Encounter for screening for malignant neoplasm of cervix: Secondary | ICD-10-CM | POA: Insufficient documentation

## 2019-02-01 DIAGNOSIS — Z3482 Encounter for supervision of other normal pregnancy, second trimester: Secondary | ICD-10-CM

## 2019-02-01 DIAGNOSIS — Z3A27 27 weeks gestation of pregnancy: Secondary | ICD-10-CM

## 2019-02-01 DIAGNOSIS — Z331 Pregnant state, incidental: Secondary | ICD-10-CM

## 2019-02-01 DIAGNOSIS — Z1389 Encounter for screening for other disorder: Secondary | ICD-10-CM

## 2019-02-01 LAB — POCT URINALYSIS DIPSTICK OB
Blood, UA: NEGATIVE
Glucose, UA: NEGATIVE
Ketones, UA: NEGATIVE
Leukocytes, UA: NEGATIVE
Nitrite, UA: NEGATIVE
POC,PROTEIN,UA: NEGATIVE

## 2019-02-01 NOTE — Progress Notes (Signed)
   LOW-RISK PREGNANCY VISIT Patient name: Sierra Heath MRN [id number]  Date of birth: 11-Jul-1991 Chief Complaint:   Routine Prenatal Visit (PN2)  History of Present Illness:   Sierra Heath is a 28 y.o. G35P2002 female at [redacted]w[redacted]d with an Estimated Date of Delivery: 04/30/19 being seen today for ongoing management of a low-risk pregnancy.  Today she reports no complaints. Contractions: Not present. Vag. Bleeding: None.  Movement: Present. denies leaking of fluid. Review of Systems:   Pertinent items are noted in HPI Denies abnormal vaginal discharge w/ itching/odor/irritation, headaches, visual changes, shortness of breath, chest pain, abdominal pain, severe nausea/vomiting, or problems with urination or bowel movements unless otherwise stated above. Pertinent History Reviewed:  Reviewed past medical,surgical, social, obstetrical and family history.  Reviewed problem list, medications and allergies. Physical Assessment:   Vitals:   02/01/19 0941  BP: (!) 119/59  Pulse: 85  Weight: (!) 324 lb (147 kg)  Body mass index is 52.29 kg/m.        Physical Examination:   General appearance: Well appearing, and in no distress  Mental status: Alert, oriented to person, place, and time  Skin: Warm & dry  Cardiovascular: Normal heart rate noted  Respiratory: Normal respiratory effort, no distress  Abdomen: Soft, gravid, nontender  Pelvic: Cervical exam deferred         Extremities: Edema: Trace  Fetal Status: Fetal Heart Rate (bpm): 154 Fundal Height: 31 cm Movement: Present    Results for orders placed or performed in visit on 02/01/19 (from the past 24 hour(s))  POC Urinalysis Dipstick OB   Collection Time: 02/01/19  9:48 AM  Result Value Ref Range   Color, UA     Clarity, UA     Glucose, UA Negative Negative   Bilirubin, UA     Ketones, UA neg    Spec Grav, UA     Blood, UA neg    pH, UA     POC,PROTEIN,UA Negative Negative, Trace, Small (1+), Moderate (2+), Large (3+), 4+    Urobilinogen, UA     Nitrite, UA neg    Leukocytes, UA Negative Negative   Appearance     Odor      Assessment & Plan:  1) Low-risk pregnancy G3P2002 at [redacted]w[redacted]d with an Estimated Date of Delivery: 04/30/19   2) Borderline BP   Meds: No orders of the defined types were placed in this encounter.  Labs/procedures today: PN2 next week  Plan:  Continue routine obstetrical care   Reviewed: Preterm labor symptoms and general obstetric precautions including but not limited to vaginal bleeding, contractions, leaking of fluid and fetal movement were reviewed in detail with the patient.  All questions were answered.  home bp cuff. Rx faxed to . Check bp weekly, let us know if >140/90.   Follow-up: Return in about 3 weeks (around 02/22/2019) for LROB.  Orders Placed This Encounter  Procedures  . POC Urinalysis Dipstick OB   Mertie Clause Trashaun Streight  02/01/2019 10:20 AM

## 2019-02-02 LAB — CYTOLOGY - PAP: Diagnosis: NEGATIVE

## 2019-02-09 ENCOUNTER — Other Ambulatory Visit: Payer: Self-pay

## 2019-02-09 ENCOUNTER — Other Ambulatory Visit: Payer: Medicaid Other

## 2019-02-09 DIAGNOSIS — Z3A28 28 weeks gestation of pregnancy: Secondary | ICD-10-CM

## 2019-02-09 DIAGNOSIS — Z3483 Encounter for supervision of other normal pregnancy, third trimester: Secondary | ICD-10-CM | POA: Diagnosis not present

## 2019-02-10 LAB — CBC
Hematocrit: 31.2 % — ABNORMAL LOW (ref 34.0–46.6)
Hemoglobin: 10 g/dL — ABNORMAL LOW (ref 11.1–15.9)
MCH: 25.3 pg — ABNORMAL LOW (ref 26.6–33.0)
MCHC: 32.1 g/dL (ref 31.5–35.7)
MCV: 79 fL (ref 79–97)
Platelets: 203 10*3/uL (ref 150–450)
RBC: 3.95 x10E6/uL (ref 3.77–5.28)
RDW: 22.8 % — ABNORMAL HIGH (ref 11.7–15.4)
WBC: 7.6 10*3/uL (ref 3.4–10.8)

## 2019-02-10 LAB — RPR: RPR Ser Ql: NONREACTIVE

## 2019-02-10 LAB — GLUCOSE TOLERANCE, 2 HOURS W/ 1HR
Glucose, 1 hour: 175 mg/dL (ref 65–179)
Glucose, 2 hour: 119 mg/dL (ref 65–152)
Glucose, Fasting: 90 mg/dL (ref 65–91)

## 2019-02-10 LAB — ANTIBODY SCREEN: Antibody Screen: NEGATIVE

## 2019-02-10 LAB — HIV ANTIBODY (ROUTINE TESTING W REFLEX): HIV Screen 4th Generation wRfx: NONREACTIVE

## 2019-02-15 ENCOUNTER — Other Ambulatory Visit: Payer: Self-pay

## 2019-02-15 ENCOUNTER — Inpatient Hospital Stay (HOSPITAL_COMMUNITY)
Admission: AD | Admit: 2019-02-15 | Discharge: 2019-02-15 | Disposition: A | Discharge status: Home / Self Care | Payer: Medicaid Other | Attending: Obstetrics and Gynecology | Admitting: Obstetrics and Gynecology

## 2019-02-15 ENCOUNTER — Telehealth: Payer: Self-pay | Admitting: *Deleted

## 2019-02-15 NOTE — Telephone Encounter (Signed)
Pt says that she keeps having to go pee, when she went in labor with her daughter she had to pee frequently and her water was broken. Pt reports good fetal movement. No leaking of fluid, bleeding or discharge. Belly doesn't feel tight. Advised patient to monitor symptoms, gave reassurance and told to call back with any other questions or concerns.

## 2019-02-15 NOTE — MAU Note (Signed)
PT spoke with registration and told them she was going to go to the office to be evaluated.

## 2019-02-16 ENCOUNTER — Encounter: Payer: Medicaid Other | Admitting: Advanced Practice Midwife

## 2019-02-22 ENCOUNTER — Ambulatory Visit (INDEPENDENT_AMBULATORY_CARE_PROVIDER_SITE_OTHER): Payer: Medicaid Other | Admitting: Obstetrics & Gynecology

## 2019-02-22 ENCOUNTER — Encounter: Payer: Self-pay | Admitting: Obstetrics & Gynecology

## 2019-02-22 ENCOUNTER — Other Ambulatory Visit: Payer: Self-pay

## 2019-02-22 VITALS — BP 99/66 | HR 81 | Wt 321.0 lb

## 2019-02-22 DIAGNOSIS — Z23 Encounter for immunization: Secondary | ICD-10-CM | POA: Diagnosis not present

## 2019-02-22 DIAGNOSIS — Z1389 Encounter for screening for other disorder: Secondary | ICD-10-CM

## 2019-02-22 DIAGNOSIS — Z3A30 30 weeks gestation of pregnancy: Secondary | ICD-10-CM

## 2019-02-22 DIAGNOSIS — O0993 Supervision of high risk pregnancy, unspecified, third trimester: Secondary | ICD-10-CM

## 2019-02-22 DIAGNOSIS — Z331 Pregnant state, incidental: Secondary | ICD-10-CM

## 2019-02-22 DIAGNOSIS — O0990 Supervision of high risk pregnancy, unspecified, unspecified trimester: Secondary | ICD-10-CM

## 2019-02-22 LAB — POCT URINALYSIS DIPSTICK OB
Blood, UA: NEGATIVE
Glucose, UA: NEGATIVE
Ketones, UA: NEGATIVE
Leukocytes, UA: NEGATIVE
Nitrite, UA: NEGATIVE

## 2019-02-22 NOTE — Progress Notes (Signed)
   [place] PREGNANCY VISIT Patient name: JOSAPHINE SHIMAMOTO MRN [id number]  Date of birth: Jun 24, 1991 Chief Complaint:   High Risk Gestation  History of Present Illness:   Sierra Heath is a 28 y.o. G58P2002 female at [redacted]w[redacted]d with an Estimated Date of Delivery: 04/30/19 being seen today for ongoing management of a high-risk pregnancy complicated by borderline CHTN but her BP at this point does not warrant antenatal surveillance Today she reports no complaints. Contractions: Not present.  .  Movement: Present. denies leaking of fluid.  Review of Systems:   Pertinent items are noted in HPI Denies abnormal vaginal discharge w/ itching/odor/irritation, headaches, visual changes, shortness of breath, chest pain, abdominal pain, severe nausea/vomiting, or problems with urination or bowel movements unless otherwise stated above. Pertinent History Reviewed:  Reviewed past medical,surgical, social, obstetrical and family history.  Reviewed problem list, medications and allergies. Physical Assessment:   Vitals:   02/22/19 0839  BP: 99/66  Pulse: 81  Weight: (!) 321 lb (145.6 kg)  Body mass index is 51.81 kg/m.           Physical Examination:   General appearance: alert, well appearing, and in no distress  Mental status: alert, oriented to person, place, and time  Skin: warm & dry   Extremities: Edema: None    Cardiovascular: normal heart rate noted  Respiratory: normal respiratory effort, no distress  Abdomen: gravid, soft, non-tender  Pelvic: Cervical exam deferred         Fetal Status:     Movement: Present    Fetal Surveillance Testing today: FHR 142   Results for orders placed or performed in visit on 02/22/19 (from the past 24 hour(s))  POC Urinalysis Dipstick OB   Collection Time: 02/22/19  8:48 AM  Result Value Ref Range   Color, UA     Clarity, UA     Glucose, UA Negative Negative   Bilirubin, UA     Ketones, UA neg    Spec Grav, UA     Blood, UA neg    pH, UA     POC,PROTEIN,UA Small (1+) Negative, Trace, Small (1+), Moderate (2+), Large (3+), 4+   Urobilinogen, UA     Nitrite, UA neg    Leukocytes, UA Negative Negative   Appearance     Odor      Assessment & Plan:  1) High-risk pregnancy G3P2002 at [redacted]w[redacted]d with an Estimated Date of Delivery: 04/30/19   2) Borderline BP, at this point her BP is so good does not warrant antenatal survillance, at least at this point, continue to se in office     Meds: No orders of the defined types were placed in this encounter.   Labs/procedures today:   Treatment Plan:  Routine care t this point if BP worsens begin antenatal survillance  Reviewed:  labor symptoms and general obstetric precautions including but not limited to vaginal bleeding, contractions, leaking of fluid and fetal movement were reviewed in detail with the patient.  All questions were answered.  Has home bp cuff. Rx faxed to . Check bp daily, let us know if >140/90.   Follow-up: No follow-ups on file.  Orders Placed This Encounter  Procedures  . Tdap vaccine greater than or equal to 7yo IM  . POC Urinalysis Dipstick OB   Mertie Clause Eure  02/22/2019 8:59 AM

## 2019-03-15 ENCOUNTER — Encounter: Payer: Medicaid Other | Admitting: Women's Health

## 2019-03-17 ENCOUNTER — Encounter: Payer: Medicaid Other | Admitting: Medical

## 2019-03-24 ENCOUNTER — Ambulatory Visit (INDEPENDENT_AMBULATORY_CARE_PROVIDER_SITE_OTHER): Payer: Medicaid Other | Admitting: Advanced Practice Midwife

## 2019-03-24 ENCOUNTER — Encounter: Payer: Self-pay | Admitting: Advanced Practice Midwife

## 2019-03-24 ENCOUNTER — Other Ambulatory Visit: Payer: Self-pay

## 2019-03-24 VITALS — BP 129/75 | HR 106 | Wt 325.0 lb

## 2019-03-24 DIAGNOSIS — O10913 Unspecified pre-existing hypertension complicating pregnancy, third trimester: Secondary | ICD-10-CM

## 2019-03-24 DIAGNOSIS — Z1389 Encounter for screening for other disorder: Secondary | ICD-10-CM

## 2019-03-24 DIAGNOSIS — O0993 Supervision of high risk pregnancy, unspecified, third trimester: Secondary | ICD-10-CM

## 2019-03-24 DIAGNOSIS — Z331 Pregnant state, incidental: Secondary | ICD-10-CM

## 2019-03-24 DIAGNOSIS — O10919 Unspecified pre-existing hypertension complicating pregnancy, unspecified trimester: Secondary | ICD-10-CM

## 2019-03-24 DIAGNOSIS — O0990 Supervision of high risk pregnancy, unspecified, unspecified trimester: Secondary | ICD-10-CM

## 2019-03-24 DIAGNOSIS — Z3A34 34 weeks gestation of pregnancy: Secondary | ICD-10-CM

## 2019-03-24 NOTE — Addendum Note (Signed)
Addended by: Diona Fanti A on: 03/24/2019 03:29 PM   Modules accepted: Orders

## 2019-03-24 NOTE — Progress Notes (Signed)
[place] PREGNANCY VISIT Patient name: Sierra Heath MRN [id number]  Date of birth: 11-13-1990 Chief Complaint:   High Risk Gestation  History of Present Illness:   Sierra Heath is a 28 y.o. G84P2002 female at [redacted]w[redacted]d with an Estimated Date of Delivery: 04/30/19 being seen today for ongoing management of a high-risk pregnancy. She has hx of HTN but BPs are low and don't warrant testing at this time per LHE .Taking it at home, normal Today she reports no complaints. Contractions: Irregular.  .  Movement: Present. denies leaking of fluid. Review of Systems:   Pertinent items are noted in HPI Denies abnormal vaginal discharge w/ itching/odor/irritation, headaches, visual changes, shortness of breath, chest pain, abdominal pain, severe nausea/vomiting, or problems with urination or bowel movements unless otherwise stated above.  Pertinent History Reviewed:  Medical & Surgical Hx:   Past Medical History:  Diagnosis Date  . Acid reflux   . ADHD   . Anxiety   . Back pain, chronic   . BV (bacterial vaginosis) 09/11/2014  . Contraceptive management 09/11/2014  . Depression   . Diarrhea 10/11/2014  . Hemorrhoids 10/03/2014  . History of cesarean section 12/25/2015  . History of chlamydia   . Migraine   . Nausea 10/11/2014  . Obesity   . Pregnant 12/25/2015  . Rectal fissure 10/03/2014  . Sleep apnea   . Sleep apnea   . Vaginal discharge 09/11/2014   Past Surgical History:  Procedure Laterality Date  . CESAREAN SECTION    . CESAREAN SECTION N/A 08/17/2016   Procedure: CESAREAN SECTION;  Surgeon: Mora Bellman, MD;  Location: [facility];  Service: Obstetrics;  Laterality: N/A;  repeat  . HERNIA REPAIR     age 74  . TONSILLECTOMY    . WISDOM TOOTH EXTRACTION     Family History  Problem Relation Age of Onset  . Heart attack Father   . Hypertension Mother   . Hyperlipidemia Mother   . COPD Maternal Grandmother   . Cancer Paternal Grandmother   . Diabetes Paternal  Grandmother   . Down syndrome Maternal Aunt   . Colon cancer Neg Hx     Current Outpatient Medications:  .  Blood Pressure Monitor MISC, For regular home bp monitoring during pregnancy, Disp: 1 each, Rfl: 0 .  omeprazole (PRILOSEC) 20 MG capsule, TAKE (1) CAPSULE TWICE DAILY BEFORE MEALS. (Patient taking differently: TAKE (1) CAPSULE DAILY BEFORE MEALS.), Disp: 180 capsule, Rfl: 3 .  prenatal vitamin w/FE, FA (PRENATAL 1 + 1) 27-1 MG TABS tablet, Take 1 tablet by mouth daily at 12 noon., Disp: 30 each, Rfl: 12 .  escitalopram (LEXAPRO) 10 MG tablet, Take 1 tablet (10 mg total) by mouth daily. (Patient not taking: Reported on 02/01/2019), Disp: 30 tablet, Rfl: 6 .  Ferrous Sulfate (IRON) 325 (65 Fe) MG TABS, Take 1 tablet (325 mg total) by mouth 2 (two) times daily. (Patient not taking: Reported on 02/01/2019), Disp: 60 each, Rfl: 8 .  promethazine (PHENERGAN) 25 MG tablet, Take 1 tablet (25 mg total) by mouth every 6 (six) hours as needed for nausea or vomiting. (Patient not taking: Reported on 10/27/2018), Disp: 30 tablet, Rfl: 1 Social History: Reviewed -  reports that she has never smoked. She has never used smokeless tobacco.  Physical Assessment:   Vitals:   03/24/19 1436  BP: 129/75  Pulse: (!) 106  Weight: (!) 325 lb (147.4 kg)  Body mass index is 52.46 kg/m.  Physical Examination:   General appearance: Well appearing, and in no distress  Mental status: Alert, oriented to person, place, and time  Skin: Warm & dry  Cardiovascular: Normal heart rate noted  Respiratory: Normal respiratory effort, no distress  Abdomen: Soft, gravid, nontender  Pelvic: Cervical exam deferred         Extremities: Edema: None  Fetal Status: Fetal Heart Rate (bpm): 157 Fundal Height: 38 cm Movement: Present    No results found for this or any previous visit (from the past 24 hour(s)).  Assessment & Plan:  1) High-risk pregnancy G3P2002 at 7033w5d with an Estimated Date of Delivery: 04/30/19    2) Hx CHTN, BPs normal   Labs/procedures/US today:   Plan:  Continue routine obstetrical care    Follow-up: Return in about 2 weeks (around 04/07/2019) for HROB, US:EFW w/LHE.  schedule CS.  Orders Placed This Encounter  Procedures  . US OB Follow Up  . POC Urinalysis Dipstick OB   Jacklyn ShellFrances Cresenzo-Dishmon CNM 03/24/2019 3:09 PM

## 2019-03-29 ENCOUNTER — Telehealth: Payer: Self-pay | Admitting: *Deleted

## 2019-03-29 ENCOUNTER — Other Ambulatory Visit: Payer: Medicaid Other

## 2019-03-29 ENCOUNTER — Other Ambulatory Visit: Payer: Self-pay

## 2019-03-29 DIAGNOSIS — O99013 Anemia complicating pregnancy, third trimester: Secondary | ICD-10-CM

## 2019-03-29 NOTE — Telephone Encounter (Signed)
Patient states she has been having more contractions than usual today but is not hurting.  She states she has also not been drinking much water either.  Advised patient to push fluids as she could be slightly dehydrated.  She also states she has been cold and is concerned her hemoglobin has dropped again and wants to have it checked in case she needs another IV infusion.

## 2019-03-30 LAB — CBC
Hematocrit: 29.6 % — ABNORMAL LOW (ref 34.0–46.6)
Hemoglobin: 9.5 g/dL — ABNORMAL LOW (ref 11.1–15.9)
MCH: 24.7 pg — ABNORMAL LOW (ref 26.6–33.0)
MCHC: 32.1 g/dL (ref 31.5–35.7)
MCV: 77 fL — ABNORMAL LOW (ref 79–97)
Platelets: 203 10*3/uL (ref 150–450)
RBC: 3.84 x10E6/uL (ref 3.77–5.28)
RDW: 15.9 % — ABNORMAL HIGH (ref 11.7–15.4)
WBC: 7.6 10*3/uL (ref 3.4–10.8)

## 2019-04-07 ENCOUNTER — Telehealth: Payer: Self-pay | Admitting: *Deleted

## 2019-04-07 ENCOUNTER — Ambulatory Visit (INDEPENDENT_AMBULATORY_CARE_PROVIDER_SITE_OTHER): Payer: Medicaid Other | Admitting: Family Medicine

## 2019-04-07 ENCOUNTER — Other Ambulatory Visit: Payer: Self-pay

## 2019-04-07 VITALS — Wt 328.0 lb

## 2019-04-07 DIAGNOSIS — Z1389 Encounter for screening for other disorder: Secondary | ICD-10-CM

## 2019-04-07 DIAGNOSIS — O98813 Other maternal infectious and parasitic diseases complicating pregnancy, third trimester: Secondary | ICD-10-CM

## 2019-04-07 DIAGNOSIS — Z3483 Encounter for supervision of other normal pregnancy, third trimester: Secondary | ICD-10-CM

## 2019-04-07 DIAGNOSIS — Z3A36 36 weeks gestation of pregnancy: Secondary | ICD-10-CM | POA: Diagnosis not present

## 2019-04-07 DIAGNOSIS — B379 Candidiasis, unspecified: Secondary | ICD-10-CM

## 2019-04-07 LAB — POCT URINALYSIS DIPSTICK OB
Blood, UA: NEGATIVE
Glucose, UA: NEGATIVE
Ketones, UA: NEGATIVE
Nitrite, UA: NEGATIVE

## 2019-04-07 MED ORDER — CLOTRIMAZOLE 1 % VA CREA: 1.0000 | TOPICAL_CREAM | Freq: Every day | VAGINAL | 2 refills | Status: DC

## 2019-04-07 NOTE — Progress Notes (Signed)
    PRENATAL VISIT NOTE  Subjective:  Sierra Heath is a 28 y.o. G3P2002 at [redacted]w[redacted]d being seen today for ongoing prenatal care.  She is currently monitored for the following issues for this high-risk pregnancy and has History of cesarean section; Morbid obesity ([place]); Sleep apnea; Anxiety and depression; Migraine headache without aura; Supervision of high risk pregnancy, antepartum; Iron deficiency anemia; and Chronic hypertension affecting pregnancy on their problem list.  Patient reports leaking fluid-- started yesterday. did not eat this morning because she thought it was her water breaking and this is how her other labor started. .  Contractions: Irregular. Vag. Bleeding: None.  Movement: Present. Denies leaking of fluid.   The following portions of the patient's history were reviewed and updated as appropriate: allergies, current medications, past family history, past medical history, past social history, past surgical history and problem list. Problem list updated.  Objective:   Vitals:   04/07/19 1003  Weight: (!) 328 lb (148.8 kg)    Fetal Status: Fetal Heart Rate (bpm): 155   Movement: Present  Presentation: Vertex  General:  Alert, oriented and cooperative. Patient is in no acute distress.  Skin: Skin is warm and dry. No rash noted.   Cardiovascular: Normal heart rate noted  Respiratory: Normal respiratory effort, no problems with respiration noted  Abdomen: Soft, gravid, appropriate for gestational age.  Pain/Pressure: Present     Pelvic: Cervical exam performed Dilation: Closed Effacement (%): Thick Station: Ballotable . Sterile speculum performed first, closed appearing, no pooling and fern negative.   Extremities: Normal range of motion.  Edema: None  Mental Status: Normal mood and affect. Normal behavior. Normal judgment and thought content.   Assessment and Plan:  Pregnancy: G3P2002 at [redacted]w[redacted]d  1. [redacted] weeks gestation of pregnancy - GC/Chlamydia Probe Amp - Culture,  beta strep (group b only) - POC Urinalysis Dipstick OB  2. Encounter for supervision of other normal pregnancy in third trimester - ROM work up negative with no pooling, no ferning. Yeast on wet mount- sent clotrimazole to pharmacy - GC/Chlamydia Probe Amp - Culture, beta strep (group b only)  3. Screening for genitourinary condition - POC Urinalysis Dipstick OB   Preterm labor symptoms and general obstetric precautions including but not limited to vaginal bleeding, contractions, leaking of fluid and fetal movement were reviewed in detail with the patient. Please refer to After Visit Summary for other counseling recommendations.  Return in about 1 week (around 04/14/2019) for Routine prenatal care.  Future Appointments  Date Time Provider [place]  04/12/2019  2:00 PM [facility] [place] [facility] None  04/12/2019  2:30 PM Eure, Mertie Clause, MD [facility]    Caren Macadam, MD

## 2019-04-07 NOTE — Telephone Encounter (Signed)
Patient called stating she thinks her water has broken as she has needed to change her panties a few times.  However, she is also concerned she may a yeast infection. States she was hurting a little last night but is not as much this morning.  Will w/i for eval.

## 2019-04-08 ENCOUNTER — Encounter: Payer: Self-pay | Admitting: *Deleted

## 2019-04-11 LAB — CULTURE, BETA STREP (GROUP B ONLY): Strep Gp B Culture: NEGATIVE

## 2019-04-11 LAB — GC/CHLAMYDIA PROBE AMP
Chlamydia trachomatis, NAA: NEGATIVE
Neisseria Gonorrhoeae by PCR: NEGATIVE

## 2019-04-12 ENCOUNTER — Other Ambulatory Visit: Payer: Self-pay

## 2019-04-12 ENCOUNTER — Ambulatory Visit (INDEPENDENT_AMBULATORY_CARE_PROVIDER_SITE_OTHER): Payer: Medicaid Other

## 2019-04-12 ENCOUNTER — Encounter: Payer: Self-pay | Admitting: Obstetrics & Gynecology

## 2019-04-12 ENCOUNTER — Ambulatory Visit (INDEPENDENT_AMBULATORY_CARE_PROVIDER_SITE_OTHER): Payer: Medicaid Other | Admitting: Obstetrics & Gynecology

## 2019-04-12 VITALS — BP 126/73 | HR 104 | Wt 328.4 lb

## 2019-04-12 DIAGNOSIS — O0993 Supervision of high risk pregnancy, unspecified, third trimester: Secondary | ICD-10-CM

## 2019-04-12 DIAGNOSIS — O10919 Unspecified pre-existing hypertension complicating pregnancy, unspecified trimester: Secondary | ICD-10-CM

## 2019-04-12 DIAGNOSIS — O10913 Unspecified pre-existing hypertension complicating pregnancy, third trimester: Secondary | ICD-10-CM

## 2019-04-12 DIAGNOSIS — Z3A37 37 weeks gestation of pregnancy: Secondary | ICD-10-CM

## 2019-04-12 DIAGNOSIS — O0990 Supervision of high risk pregnancy, unspecified, unspecified trimester: Secondary | ICD-10-CM

## 2019-04-12 DIAGNOSIS — Z98891 History of uterine scar from previous surgery: Secondary | ICD-10-CM

## 2019-04-12 NOTE — Progress Notes (Signed)
   [place] PREGNANCY VISIT Patient name: Sierra Heath MRN [id number]  Date of birth: 08-01-1990 Chief Complaint:   High Risk Gestation ([place] today)  History of Present Illness:   Sierra Heath is a 28 y.o. G71P2002 female at [redacted]w[redacted]d with an Estimated Date of Delivery: 04/30/19 being seen today for ongoing management of a low-risk pregnancy.  Today she reports no complaints. Contractions: Irregular. Vag. Bleeding: None.  Movement: Present. denies leaking of fluid. Review of Systems:   Pertinent items are noted in HPI Denies abnormal vaginal discharge w/ itching/odor/irritation, headaches, visual changes, shortness of breath, chest pain, abdominal pain, severe nausea/vomiting, or problems with urination or bowel movements unless otherwise stated above. Pertinent History Reviewed:  Reviewed past medical,surgical, social, obstetrical and family history.  Reviewed problem list, medications and allergies. Physical Assessment:   Vitals:   04/12/19 1445  BP: 126/73  Pulse: (!) 104  Weight: (!) 328 lb 6.4 oz (149 kg)  Body mass index is 53.01 kg/m.        Physical Examination:   General appearance: Well appearing, and in no distress  Mental status: Alert, oriented to person, place, and time  Skin: Warm & dry  Cardiovascular: Normal heart rate noted  Respiratory: Normal respiratory effort, no distress  Abdomen: Soft, gravid, nontender  Pelvic: Cervical exam deferred         Extremities: Edema: Trace  Fetal Status: Fetal Heart Rate (bpm): 146   Movement: Present    Results for orders placed or performed during the hospital encounter of 04/27/19 (from the past 24 hour(s))  CBC   Collection Time: 04/27/19  3:15 PM  Result Value Ref Range   WBC 10.2 4.0 - 10.5 K/uL   RBC 3.48 (L) 3.87 - 5.11 MIL/uL   Hemoglobin 8.3 (L) 12.0 - 15.0 g/dL   HCT 26.7 (L) 36.0 - 46.0 %   MCV 76.7 (L) 80.0 - 100.0 fL   MCH 23.9 (L) 26.0 - 34.0 pg   MCHC 31.1 30.0 - 36.0 g/dL   RDW 16.4 (H) 11.5 - 15.5  %   Platelets 210 150 - 400 K/uL   nRBC 0.0 0.0 - 0.2 %  Creatinine, serum   Collection Time: 04/27/19  3:15 PM  Result Value Ref Range   Creatinine, Ser 0.53 0.44 - 1.00 mg/dL   GFR calc non Af Amer >60 >60 mL/min   GFR calc Af Amer >60 >60 mL/min    Assessment & Plan:  1) Low-risk pregnancy G3P2002 at [redacted]w[redacted]d with an Estimated Date of Delivery: 04/30/19   2) Fetal macrosomia,   3)  Repeat C section scheduled 04/27/2019 Post placental IUD   Meds: No orders of the defined types were placed in this encounter.  Labs/procedures today: sonogram  Plan:  Continue routine obstetrical care   Reviewed:  labor symptoms and general obstetric precautions including but not limited to vaginal bleeding, contractions, leaking of fluid and fetal movement were reviewed in detail with the patient.  All questions were answered.  home bp cuff. Rx faxed to . Check bp weekly, let us know if >140/90.   Follow-up: Return in about 1 week (around 04/19/2019) for [place], with Dr Elonda Husky.  No orders of the defined types were placed in this encounter.  Florian Buff  04/27/2019 6:07 PM

## 2019-04-12 NOTE — Progress Notes (Signed)
[place] 26+8 wks,cephalic,posterior placenta gr 3,afi 15 cm,right renal pelvic dilatation 9.4 mm,LK wnl, FHR 146 BPM,EFW 4298 g 99.8%,limited view

## 2019-04-14 ENCOUNTER — Encounter (HOSPITAL_COMMUNITY): Payer: Self-pay

## 2019-04-14 NOTE — Patient Instructions (Signed)
Sierra Heath  04/14/2019   Your procedure is scheduled on:  04/27/2019  Arrive at 4 at Entrance C on [address] at [facility]  and [address]. You are invited to use the FREE valet parking or use the Visitor's parking deck.  Pick up the phone at the desk and dial [phone].  Call this number if you have problems the morning of surgery: [phone]  Remember:   Do not eat food:(After Midnight) Desps de medianoche.  Do not drink clear liquids: (After Midnight) Desps de medianoche.  Take these medicines the morning of surgery with A SIP OF WATER:  none   Do not wear jewelry, make-up or nail polish.  Do not wear lotions, powders, or perfumes. Do not wear deodorant.  Do not shave 48 hours prior to surgery.  Do not bring valuables to the hospital.  [facility] is not   responsible for any belongings or valuables brought to the hospital.  Contacts, dentures or bridgework may not be worn into surgery.  Leave suitcase in the car. After surgery it may be brought to your room.  For patients admitted to the hospital, checkout time is 11:00 AM the day of              discharge.      Please read over the following fact sheets that you were given:     Preparing for Surgery

## 2019-04-15 ENCOUNTER — Other Ambulatory Visit: Payer: Self-pay | Admitting: Family Medicine

## 2019-04-15 DIAGNOSIS — K2100 Gastro-esophageal reflux disease with esophagitis, without bleeding: Secondary | ICD-10-CM

## 2019-04-18 ENCOUNTER — Other Ambulatory Visit: Payer: Medicaid Other

## 2019-04-21 ENCOUNTER — Other Ambulatory Visit: Payer: Self-pay

## 2019-04-21 ENCOUNTER — Encounter: Payer: Self-pay | Admitting: Obstetrics & Gynecology

## 2019-04-21 ENCOUNTER — Ambulatory Visit (INDEPENDENT_AMBULATORY_CARE_PROVIDER_SITE_OTHER): Payer: Medicaid Other | Admitting: Obstetrics & Gynecology

## 2019-04-21 VITALS — BP 123/74 | HR 111 | Wt 323.0 lb

## 2019-04-21 DIAGNOSIS — O10913 Unspecified pre-existing hypertension complicating pregnancy, third trimester: Secondary | ICD-10-CM

## 2019-04-21 DIAGNOSIS — Z98891 History of uterine scar from previous surgery: Secondary | ICD-10-CM

## 2019-04-21 DIAGNOSIS — O10919 Unspecified pre-existing hypertension complicating pregnancy, unspecified trimester: Secondary | ICD-10-CM

## 2019-04-21 DIAGNOSIS — Z1389 Encounter for screening for other disorder: Secondary | ICD-10-CM

## 2019-04-21 DIAGNOSIS — O2343 Unspecified infection of urinary tract in pregnancy, third trimester: Secondary | ICD-10-CM

## 2019-04-21 DIAGNOSIS — O0993 Supervision of high risk pregnancy, unspecified, third trimester: Secondary | ICD-10-CM

## 2019-04-21 DIAGNOSIS — O0990 Supervision of high risk pregnancy, unspecified, unspecified trimester: Secondary | ICD-10-CM

## 2019-04-21 DIAGNOSIS — Z3A38 38 weeks gestation of pregnancy: Secondary | ICD-10-CM

## 2019-04-21 DIAGNOSIS — Z331 Pregnant state, incidental: Secondary | ICD-10-CM

## 2019-04-21 LAB — POCT URINALYSIS DIPSTICK OB
Glucose, UA: NEGATIVE
Nitrite, UA: POSITIVE

## 2019-04-21 MED ORDER — CEPHALEXIN 500 MG PO CAPS: 500.0000 mg | ORAL_CAPSULE | Freq: Three times a day (TID) | ORAL | 0 refills | Status: DC

## 2019-04-21 NOTE — Progress Notes (Signed)
   [place] PREGNANCY VISIT Patient name: Sierra Heath MRN [id number]  Date of birth: [birth year] Chief Complaint:   High Risk Gestation  History of Present Illness:   Sierra Heath is a 28 y.o. G28P2002 female at [redacted]w[redacted]d with an Estimated Date of Delivery: 04/30/19 being seen today for ongoing management of a high-risk pregnancy complicated by chronic hypertension currently on nothing.  Today she reports no complaints. Contractions: Irregular. Vag. Bleeding: None.  Movement: Present. denies leaking of fluid.  Review of Systems:   Pertinent items are noted in HPI Denies abnormal vaginal discharge w/ itching/odor/irritation, headaches, visual changes, shortness of breath, chest pain, abdominal pain, severe nausea/vomiting, or problems with urination or bowel movements unless otherwise stated above. Pertinent History Reviewed:  Reviewed past medical,surgical, social, obstetrical and family history.  Reviewed problem list, medications and allergies. Physical Assessment:   Vitals:   04/21/19 1154  BP: 123/74  Pulse: (!) 111  Weight: (!) 323 lb (146.5 kg)  Body mass index is 52.13 kg/m.           Physical Examination:   General appearance: alert, well appearing, and in no distress  Mental status: alert, oriented to person, place, and time  Skin: warm & dry   Extremities: Edema: None    Cardiovascular: normal heart rate noted  Respiratory: normal respiratory effort, no distress  Abdomen: gravid, soft, non-tender  Pelvic: Cervical exam deferred         Fetal Status:     Movement: Present    Fetal Surveillance Testing today:    Results for orders placed or performed in visit on 04/21/19 (from the past 24 hour(s))  POC Urinalysis Dipstick OB   Collection Time: 04/21/19 11:56 AM  Result Value Ref Range   Color, UA     Clarity, UA     Glucose, UA Negative Negative   Bilirubin, UA     Ketones, UA 4+    Spec Grav, UA     Blood, UA trace    pH, UA     POC,PROTEIN,UA Small (1+)  Negative, Trace, Small (1+), Moderate (2+), Large (3+), 4+   Urobilinogen, UA     Nitrite, UA positive    Leukocytes, UA Trace (A) Negative   Appearance     Odor      Assessment & Plan:  1) High-risk pregnancy G3P2002 at [redacted]w[redacted]d with an Estimated Date of Delivery: 04/30/19   2) CHTN, borderline, no meds, stable  3) Previous C section, declines BTL, wants post placental IUD  4)Asymptomatic bactiuria today, culture pendig keflex given  Meds:  Meds ordered this encounter  Medications  . cephALEXin (KEFLEX) 500 MG capsule    Sig: Take 1 capsule (500 mg total) by mouth 3 (three) times daily.    Dispense:  21 capsule    Refill:  0    Labs/procedures today:   Treatment Plan:  Repeat section next week scheduled  Reviewed: Term labor symptoms and general obstetric precautions including but not limited to vaginal bleeding, contractions, leaking of fluid and fetal movement were reviewed in detail with the patient.  All questions were answered.  home bp cuff. Rx faxed to . Check bp weekly, let us know if >140/90.   Follow-up: No follow-ups on file.  Orders Placed This Encounter  Procedures  . Urine Culture  . POC Urinalysis Dipstick OB   Mertie Clause   04/21/2019 12:06 PM

## 2019-04-23 LAB — URINE CULTURE

## 2019-04-25 ENCOUNTER — Other Ambulatory Visit (HOSPITAL_COMMUNITY): Payer: Medicaid Other

## 2019-04-25 ENCOUNTER — Other Ambulatory Visit (HOSPITAL_COMMUNITY)
Admission: RE | Admit: 2019-04-25 | Discharge: 2019-04-25 | Disposition: A | Discharge status: Home / Self Care | Payer: Medicaid Other | Source: Ambulatory Visit | Attending: Obstetrics and Gynecology | Admitting: Obstetrics and Gynecology

## 2019-04-25 ENCOUNTER — Other Ambulatory Visit: Payer: Self-pay

## 2019-04-25 ENCOUNTER — Other Ambulatory Visit: Payer: Self-pay | Admitting: Obstetrics & Gynecology

## 2019-04-25 DIAGNOSIS — Z20828 Contact with and (suspected) exposure to other viral communicable diseases: Secondary | ICD-10-CM | POA: Insufficient documentation

## 2019-04-25 DIAGNOSIS — Z01812 Encounter for preprocedural laboratory examination: Secondary | ICD-10-CM | POA: Insufficient documentation

## 2019-04-25 LAB — CBC
HCT: 30 % — ABNORMAL LOW (ref 36.0–46.0)
Hemoglobin: 9.2 g/dL — ABNORMAL LOW (ref 12.0–15.0)
MCH: 23.6 pg — ABNORMAL LOW (ref 26.0–34.0)
MCHC: 30.7 g/dL (ref 30.0–36.0)
MCV: 76.9 fL — ABNORMAL LOW (ref 80.0–100.0)
Platelets: 223 10*3/uL (ref 150–400)
RBC: 3.9 MIL/uL (ref 3.87–5.11)
RDW: 16.6 % — ABNORMAL HIGH (ref 11.5–15.5)
WBC: 6.8 10*3/uL (ref 4.0–10.5)
nRBC: 0 % (ref 0.0–0.2)

## 2019-04-25 LAB — SARS CORONAVIRUS 2 BY RT PCR (HOSPITAL ORDER, PERFORMED IN ~~LOC~~ HOSPITAL LAB): SARS Coronavirus 2: NEGATIVE

## 2019-04-25 LAB — TYPE AND SCREEN
ABO/RH(D): O POS
Antibody Screen: NEGATIVE

## 2019-04-25 LAB — ABO/RH: ABO/RH(D): O POS

## 2019-04-25 NOTE — MAU Note (Signed)
Covid swab collected. PT tolerated well.PT asymptomatic. Sent to lobby to wait for labs to be drawn

## 2019-04-26 LAB — RPR: RPR Ser Ql: NONREACTIVE

## 2019-04-27 ENCOUNTER — Other Ambulatory Visit: Payer: Self-pay

## 2019-04-27 ENCOUNTER — Encounter (HOSPITAL_COMMUNITY)
Admission: RE | Disposition: A | Discharge status: Home / Self Care | Payer: Self-pay | Source: Home / Self Care | Attending: Obstetrics & Gynecology

## 2019-04-27 ENCOUNTER — Inpatient Hospital Stay (HOSPITAL_COMMUNITY): Payer: Medicaid Other | Admitting: Anesthesiology

## 2019-04-27 ENCOUNTER — Inpatient Hospital Stay (HOSPITAL_COMMUNITY)
Admission: RE | Admit: 2019-04-27 | Discharge: 2019-04-28 | DRG: 787 | Disposition: A | Discharge status: Home / Self Care | Payer: Medicaid Other | Attending: Obstetrics & Gynecology | Admitting: Obstetrics & Gynecology

## 2019-04-27 ENCOUNTER — Encounter (HOSPITAL_COMMUNITY): Payer: Self-pay | Admitting: *Deleted

## 2019-04-27 DIAGNOSIS — F419 Anxiety disorder, unspecified: Secondary | ICD-10-CM | POA: Diagnosis present

## 2019-04-27 DIAGNOSIS — O34211 Maternal care for low transverse scar from previous cesarean delivery: Secondary | ICD-10-CM | POA: Diagnosis not present

## 2019-04-27 DIAGNOSIS — O3663X0 Maternal care for excessive fetal growth, third trimester, not applicable or unspecified: Secondary | ICD-10-CM | POA: Diagnosis not present

## 2019-04-27 DIAGNOSIS — F339 Major depressive disorder, recurrent, unspecified: Secondary | ICD-10-CM | POA: Diagnosis present

## 2019-04-27 DIAGNOSIS — D509 Iron deficiency anemia, unspecified: Secondary | ICD-10-CM | POA: Diagnosis not present

## 2019-04-27 DIAGNOSIS — O1002 Pre-existing essential hypertension complicating childbirth: Secondary | ICD-10-CM | POA: Diagnosis present

## 2019-04-27 DIAGNOSIS — Z98891 History of uterine scar from previous surgery: Secondary | ICD-10-CM

## 2019-04-27 DIAGNOSIS — F329 Major depressive disorder, single episode, unspecified: Secondary | ICD-10-CM | POA: Diagnosis not present

## 2019-04-27 DIAGNOSIS — Z30430 Encounter for insertion of intrauterine contraceptive device: Secondary | ICD-10-CM

## 2019-04-27 DIAGNOSIS — Z20828 Contact with and (suspected) exposure to other viral communicable diseases: Secondary | ICD-10-CM | POA: Diagnosis present

## 2019-04-27 DIAGNOSIS — O0990 Supervision of high risk pregnancy, unspecified, unspecified trimester: Secondary | ICD-10-CM

## 2019-04-27 DIAGNOSIS — Z3A39 39 weeks gestation of pregnancy: Secondary | ICD-10-CM | POA: Diagnosis not present

## 2019-04-27 DIAGNOSIS — O99344 Other mental disorders complicating childbirth: Secondary | ICD-10-CM | POA: Diagnosis present

## 2019-04-27 DIAGNOSIS — O99214 Obesity complicating childbirth: Secondary | ICD-10-CM | POA: Diagnosis not present

## 2019-04-27 DIAGNOSIS — O9902 Anemia complicating childbirth: Secondary | ICD-10-CM | POA: Diagnosis not present

## 2019-04-27 LAB — CREATININE, SERUM
Creatinine, Ser: 0.53 mg/dL (ref 0.44–1.00)
GFR calc Af Amer: >60 mL/min (ref >60)
GFR calc non Af Amer: >60 mL/min (ref >60)

## 2019-04-27 LAB — CBC
HCT: 26.7 % — ABNORMAL LOW (ref 36.0–46.0)
Hemoglobin: 8.3 g/dL — ABNORMAL LOW (ref 12.0–15.0)
MCH: 23.9 pg — ABNORMAL LOW (ref 26.0–34.0)
MCHC: 31.1 g/dL (ref 30.0–36.0)
MCV: 76.7 fL — ABNORMAL LOW (ref 80.0–100.0)
Platelets: 210 10*3/uL (ref 150–400)
RBC: 3.48 MIL/uL — ABNORMAL LOW (ref 3.87–5.11)
RDW: 16.4 % — ABNORMAL HIGH (ref 11.5–15.5)
WBC: 10.2 10*3/uL (ref 4.0–10.5)
nRBC: 0 % (ref 0.0–0.2)

## 2019-04-27 SURGERY — Surgical Case
Anesthesia: Spinal | Site: Abdomen | Wound class: Clean Contaminated

## 2019-04-27 MED ORDER — PHENYLEPHRINE HCL-NACL 20-0.9 MG/250ML-% IV SOLN
INTRAVENOUS | Status: AC
  Filled 2019-04-27: qty 250

## 2019-04-27 MED ORDER — KETOROLAC TROMETHAMINE 30 MG/ML IJ SOLN: 30.0000 mg | Freq: Four times a day (QID) | INTRAMUSCULAR | Status: AC | PRN

## 2019-04-27 MED ORDER — SODIUM CHLORIDE 0.9% FLUSH: 3.0000 mL | INTRAVENOUS | Status: DC | PRN

## 2019-04-27 MED ORDER — DEXTROSE 5 % IV SOLN
3.0000 g | INTRAVENOUS | Status: AC
  Administered 2019-04-27: 3 g via INTRAVENOUS
  Filled 2019-04-27: qty 3000

## 2019-04-27 MED ORDER — DIPHENHYDRAMINE HCL 25 MG PO CAPS
25.0000 mg | ORAL_CAPSULE | Freq: Four times a day (QID) | ORAL | Status: DC | PRN
  Administered 2019-04-28: 01:00:00 25 mg via ORAL
  Filled 2019-04-27: qty 1

## 2019-04-27 MED ORDER — SIMETHICONE 80 MG PO CHEW
80.0000 mg | CHEWABLE_TABLET | Freq: Three times a day (TID) | ORAL | Status: DC
  Administered 2019-04-27 – 2019-04-28 (×4): 80 mg via ORAL
  Filled 2019-04-27 (×4): qty 1

## 2019-04-27 MED ORDER — OXYTOCIN 40 UNITS IN NORMAL SALINE INFUSION - SIMPLE MED: 2.5000 [IU]/h | INTRAVENOUS | Status: AC

## 2019-04-27 MED ORDER — DEXTROSE 5 % IV SOLN
INTRAVENOUS | Status: AC
  Filled 2019-04-27: qty 3000

## 2019-04-27 MED ORDER — TETANUS-DIPHTH-ACELL PERTUSSIS 5-2.5-18.5 LF-MCG/0.5 IM SUSP: 0.5000 mL | Freq: Once | INTRAMUSCULAR | Status: DC

## 2019-04-27 MED ORDER — CEFAZOLIN SODIUM-DEXTROSE 2-4 GM/100ML-% IV SOLN: 2.0000 g | INTRAVENOUS | Status: DC

## 2019-04-27 MED ORDER — ZOLPIDEM TARTRATE 5 MG PO TABS: 5.0000 mg | ORAL_TABLET | Freq: Every evening | ORAL | Status: DC | PRN

## 2019-04-27 MED ORDER — MORPHINE SULFATE (PF) 0.5 MG/ML IJ SOLN
INTRAMUSCULAR | Status: AC
  Filled 2019-04-27: qty 10

## 2019-04-27 MED ORDER — FENTANYL CITRATE (PF) 100 MCG/2ML IJ SOLN
INTRAMUSCULAR | Status: AC
  Filled 2019-04-27: qty 2

## 2019-04-27 MED ORDER — PROMETHAZINE HCL 25 MG/ML IJ SOLN: 6.2500 mg | INTRAMUSCULAR | Status: DC | PRN

## 2019-04-27 MED ORDER — SCOPOLAMINE 1 MG/3DAYS TD PT72
1.0000 | MEDICATED_PATCH | Freq: Once | TRANSDERMAL | Status: AC
  Administered 2019-04-27 – 2019-04-28 (×2): 1.5 mg via TRANSDERMAL
  Filled 2019-04-27: qty 1

## 2019-04-27 MED ORDER — WITCH HAZEL-GLYCERIN EX PADS: 1.0000 "application " | MEDICATED_PAD | CUTANEOUS | Status: DC | PRN

## 2019-04-27 MED ORDER — SIMETHICONE 80 MG PO CHEW: 80.0000 mg | CHEWABLE_TABLET | ORAL | Status: DC | PRN

## 2019-04-27 MED ORDER — STERILE WATER FOR IRRIGATION IR SOLN
Status: DC | PRN
  Administered 2019-04-27: 1

## 2019-04-27 MED ORDER — SOD CITRATE-CITRIC ACID 500-334 MG/5ML PO SOLN
30.0000 mL | ORAL | Status: AC
  Administered 2019-04-27: 10:00:00 30 mL via ORAL

## 2019-04-27 MED ORDER — SODIUM CHLORIDE 0.9 % IV SOLN
INTRAVENOUS | Status: DC | PRN
  Administered 2019-04-27: 11:00:00 via INTRAVENOUS

## 2019-04-27 MED ORDER — SCOPOLAMINE 1 MG/3DAYS TD PT72
MEDICATED_PATCH | TRANSDERMAL | Status: AC
  Filled 2019-04-27: qty 1

## 2019-04-27 MED ORDER — ONDANSETRON HCL 4 MG/2ML IJ SOLN
INTRAMUSCULAR | Status: DC | PRN
  Administered 2019-04-27: 4 mg via INTRAVENOUS

## 2019-04-27 MED ORDER — SODIUM CHLORIDE 0.9 % IV SOLN
510.0000 mg | Freq: Once | INTRAVENOUS | Status: DC
  Filled 2019-04-27 (×2): qty 17

## 2019-04-27 MED ORDER — PRENATAL MULTIVITAMIN CH
1.0000 | ORAL_TABLET | Freq: Every day | ORAL | Status: DC
  Administered 2019-04-28: 12:00:00 1 via ORAL
  Filled 2019-04-27: qty 1

## 2019-04-27 MED ORDER — LACTATED RINGERS IV SOLN
INTRAVENOUS | Status: DC
  Administered 2019-04-27 (×2): via INTRAVENOUS

## 2019-04-27 MED ORDER — SODIUM CHLORIDE 0.9 % IR SOLN
Status: DC | PRN
  Administered 2019-04-27: 1

## 2019-04-27 MED ORDER — IBUPROFEN 600 MG PO TABS
600.0000 mg | ORAL_TABLET | Freq: Four times a day (QID) | ORAL | Status: DC | PRN
  Administered 2019-04-27 – 2019-04-28 (×4): 600 mg via ORAL
  Filled 2019-04-27 (×4): qty 1

## 2019-04-27 MED ORDER — LEVONORGESTREL 19.5 MCG/DAY IU IUD
INTRAUTERINE_SYSTEM | Freq: Once | INTRAUTERINE | Status: AC
  Administered 2019-04-27: 11:00:00 via INTRAUTERINE

## 2019-04-27 MED ORDER — NALOXONE HCL 4 MG/10ML IJ SOLN
1.0000 ug/kg/h | INTRAVENOUS | Status: DC | PRN
  Filled 2019-04-27: qty 5

## 2019-04-27 MED ORDER — FENTANYL CITRATE (PF) 100 MCG/2ML IJ SOLN: 25.0000 ug | INTRAMUSCULAR | Status: DC | PRN

## 2019-04-27 MED ORDER — DIBUCAINE (PERIANAL) 1 % EX OINT: 1.0000 "application " | TOPICAL_OINTMENT | CUTANEOUS | Status: DC | PRN

## 2019-04-27 MED ORDER — LACTATED RINGERS IV SOLN
INTRAVENOUS | Status: DC | PRN
  Administered 2019-04-27 (×3): via INTRAVENOUS

## 2019-04-27 MED ORDER — PHENYLEPHRINE HCL-NACL 20-0.9 MG/250ML-% IV SOLN
INTRAVENOUS | Status: DC | PRN
  Administered 2019-04-27: 60 ug/min via INTRAVENOUS

## 2019-04-27 MED ORDER — NALOXONE HCL 0.4 MG/ML IJ SOLN: 0.4000 mg | INTRAMUSCULAR | Status: DC | PRN

## 2019-04-27 MED ORDER — MEPERIDINE HCL 25 MG/ML IJ SOLN
INTRAMUSCULAR | Status: AC
  Filled 2019-04-27: qty 1

## 2019-04-27 MED ORDER — SODIUM CHLORIDE 0.9 % IV SOLN
INTRAVENOUS | Status: DC | PRN
  Administered 2019-04-27: 40 [IU] via INTRAVENOUS

## 2019-04-27 MED ORDER — MENTHOL 3 MG MT LOZG: 1.0000 | LOZENGE | OROMUCOSAL | Status: DC | PRN

## 2019-04-27 MED ORDER — MEPERIDINE HCL 25 MG/ML IJ SOLN: 6.2500 mg | INTRAMUSCULAR | Status: DC | PRN

## 2019-04-27 MED ORDER — KETOROLAC TROMETHAMINE 30 MG/ML IJ SOLN
30.0000 mg | Freq: Once | INTRAMUSCULAR | Status: AC | PRN
  Administered 2019-04-27: 12:00:00 30 mg via INTRAVENOUS

## 2019-04-27 MED ORDER — MORPHINE SULFATE (PF) 0.5 MG/ML IJ SOLN
INTRAMUSCULAR | Status: DC | PRN
  Administered 2019-04-27: .15 mg via EPIDURAL

## 2019-04-27 MED ORDER — ACETAMINOPHEN 500 MG PO TABS
1000.0000 mg | ORAL_TABLET | Freq: Four times a day (QID) | ORAL | Status: AC
  Administered 2019-04-27 – 2019-04-28 (×3): 1000 mg via ORAL
  Filled 2019-04-27 (×3): qty 2

## 2019-04-27 MED ORDER — BUPIVACAINE IN DEXTROSE 0.75-8.25 % IT SOLN
INTRATHECAL | Status: DC | PRN
  Administered 2019-04-27: 7.5 mg via INTRATHECAL

## 2019-04-27 MED ORDER — SIMETHICONE 80 MG PO CHEW
80.0000 mg | CHEWABLE_TABLET | ORAL | Status: DC
  Administered 2019-04-27: 80 mg via ORAL
  Filled 2019-04-27: qty 1

## 2019-04-27 MED ORDER — BUPIVACAINE IN DEXTROSE 0.75-8.25 % IT SOLN
INTRATHECAL | Status: AC
  Filled 2019-04-27: qty 2

## 2019-04-27 MED ORDER — COCONUT OIL OIL: 1.0000 "application " | TOPICAL_OIL | Status: DC | PRN

## 2019-04-27 MED ORDER — GABAPENTIN 100 MG PO CAPS
100.0000 mg | ORAL_CAPSULE | Freq: Three times a day (TID) | ORAL | Status: DC
  Administered 2019-04-27 – 2019-04-28 (×4): 100 mg via ORAL
  Filled 2019-04-27 (×4): qty 1

## 2019-04-27 MED ORDER — KETOROLAC TROMETHAMINE 30 MG/ML IJ SOLN
INTRAMUSCULAR | Status: AC
  Filled 2019-04-27: qty 1

## 2019-04-27 MED ORDER — SOD CITRATE-CITRIC ACID 500-334 MG/5ML PO SOLN
ORAL | Status: AC
  Filled 2019-04-27: qty 15

## 2019-04-27 MED ORDER — ENOXAPARIN SODIUM 80 MG/0.8ML ~~LOC~~ SOLN
70.0000 mg | SUBCUTANEOUS | Status: DC
  Administered 2019-04-28: 05:00:00 70 mg via SUBCUTANEOUS
  Filled 2019-04-27: qty 0.8

## 2019-04-27 MED ORDER — OXYTOCIN 40 UNITS IN NORMAL SALINE INFUSION - SIMPLE MED
INTRAVENOUS | Status: AC
  Filled 2019-04-27: qty 1000

## 2019-04-27 MED ORDER — ONDANSETRON HCL 4 MG/2ML IJ SOLN: 4.0000 mg | Freq: Three times a day (TID) | INTRAMUSCULAR | Status: DC | PRN

## 2019-04-27 MED ORDER — ONDANSETRON HCL 4 MG/2ML IJ SOLN
INTRAMUSCULAR | Status: AC
  Filled 2019-04-27: qty 2

## 2019-04-27 MED ORDER — FENTANYL CITRATE (PF) 100 MCG/2ML IJ SOLN
INTRAMUSCULAR | Status: DC | PRN
  Administered 2019-04-27: 50 ug via INTRAVENOUS
  Administered 2019-04-27: 15 ug via INTRAVENOUS
  Administered 2019-04-27: 35 ug via INTRAVENOUS

## 2019-04-27 MED ORDER — MEPERIDINE HCL 25 MG/ML IJ SOLN
INTRAMUSCULAR | Status: DC | PRN
  Administered 2019-04-27 (×2): 12.5 mg via INTRAVENOUS

## 2019-04-27 MED ORDER — LEVONORGESTREL 19.5 MCG/DAY IU IUD
INTRAUTERINE_SYSTEM | INTRAUTERINE | Status: AC
  Filled 2019-04-27: qty 1

## 2019-04-27 MED ORDER — SENNOSIDES-DOCUSATE SODIUM 8.6-50 MG PO TABS
2.0000 | ORAL_TABLET | ORAL | Status: DC
  Administered 2019-04-27: 23:00:00 2 via ORAL
  Filled 2019-04-27: qty 2

## 2019-04-27 MED ORDER — LACTATED RINGERS IV SOLN
INTRAVENOUS | Status: DC
  Administered 2019-04-27 – 2019-04-28 (×2): via INTRAVENOUS

## 2019-04-27 SURGICAL SUPPLY — 41 items
APL PRP STRL LF DISP 70% ISPRP (MISCELLANEOUS) ×1
CHLORAPREP W/TINT 26 (MISCELLANEOUS) ×2 IMPLANT
CHLORAPREP W/TINT 26ML (MISCELLANEOUS) ×3 IMPLANT
CLAMP CORD UMBIL (MISCELLANEOUS) IMPLANT
CLOTH BEACON ORANGE TIMEOUT ST (SAFETY) ×3 IMPLANT
ELECT REM PT RETURN 9FT ADLT (ELECTROSURGICAL) ×3
ELECTRODE REM PT RTRN 9FT ADLT (ELECTROSURGICAL) ×1 IMPLANT
EXTRACTOR VACUUM BELL STYLE (SUCTIONS) IMPLANT
GLOVE BIOGEL PI IND STRL 7.0 (GLOVE) ×1 IMPLANT
GLOVE BIOGEL PI IND STRL 8 (GLOVE) ×1 IMPLANT
GLOVE BIOGEL PI INDICATOR 7.0 (GLOVE) ×2
GLOVE BIOGEL PI INDICATOR 8 (GLOVE) ×2
GLOVE ECLIPSE 8.0 STRL XLNG CF (GLOVE) ×3 IMPLANT
GOWN STRL REUS W/TWL LRG LVL3 (GOWN DISPOSABLE) ×6 IMPLANT
KIT ABG SYR 3ML LUER SLIP (SYRINGE) ×3 IMPLANT
KIT PREVENA INCISION MGT20CM45 (CANNISTER) ×2 IMPLANT
NDL HYPO 18GX1.5 BLUNT FILL (NEEDLE) ×1 IMPLANT
NDL HYPO 25X5/8 SAFETYGLIDE (NEEDLE) ×1 IMPLANT
NEEDLE HYPO 18GX1.5 BLUNT FILL (NEEDLE) ×3 IMPLANT
NEEDLE HYPO 22GX1.5 SAFETY (NEEDLE) ×3 IMPLANT
NEEDLE HYPO 25X5/8 SAFETYGLIDE (NEEDLE) ×3 IMPLANT
NS IRRIG 1000ML POUR BTL (IV SOLUTION) ×3 IMPLANT
PACK C SECTION WH (CUSTOM PROCEDURE TRAY) ×3 IMPLANT
PAD OB MATERNITY 4.3X12.25 (PERSONAL CARE ITEMS) ×3 IMPLANT
PENCIL SMOKE EVAC W/HOLSTER (ELECTROSURGICAL) ×3 IMPLANT
RTRCTR C-SECT PINK 25CM LRG (MISCELLANEOUS) IMPLANT
STAPLER VISISTAT 35W (STAPLE) ×2 IMPLANT
SUT CHROMIC 0 CT 1 (SUTURE) ×3 IMPLANT
SUT CHROMIC 0 CT 802H (SUTURE) ×2 IMPLANT
SUT MNCRL 0 VIOLET CTX 36 (SUTURE) ×2 IMPLANT
SUT MONOCRYL 0 CTX 36 (SUTURE) ×4
SUT PLAIN 2 0 (SUTURE)
SUT PLAIN 2 0 XLH (SUTURE) IMPLANT
SUT PLAIN ABS 2-0 CT1 27XMFL (SUTURE) IMPLANT
SUT VIC AB 0 CTX 36 (SUTURE) ×3
SUT VIC AB 0 CTX36XBRD ANBCTRL (SUTURE) ×1 IMPLANT
SUT VIC AB 4-0 KS 27 (SUTURE) IMPLANT
SYR 20CC LL (SYRINGE) ×6 IMPLANT
TOWEL OR 17X24 6PK STRL BLUE (TOWEL DISPOSABLE) ×3 IMPLANT
TRAY FOLEY W/BAG SLVR 14FR LF (SET/KITS/TRAYS/PACK) IMPLANT
WATER STERILE IRR 1000ML POUR (IV SOLUTION) ×3 IMPLANT

## 2019-04-27 NOTE — Discharge Summary (Signed)
Postpartum Discharge Summary     Patient Name: Sierra Heath DOB: 08/13/1990 MRN: [id number]  Date of admission: 04/27/2019 Delivering Provider: Florian Buff   Date of discharge: 04/28/2019  Admitting diagnosis: previous c section Intrauterine pregnancy: [redacted]w[redacted]d    Secondary diagnosis:  Active Problems:   History of cesarean section   Morbid [place] ([place]   Anxiety and depression   Iron deficiency anemia   Labor and delivery, indication for care  Additional problems: None     Discharge diagnosis: Term Pregnancy Delivered, CHTN and Anemia                                                                                                Post partum procedures:None  Augmentation: None  Complications: None  Hospital course:  Sceduled C/S   28y.o. yo G3P3003 at 345w4das admitted to the hospital 04/27/2019 for scheduled cesarean section with the following indication:Elective Repeat.  Membrane Rupture Time/Date: 10:53 AM ,04/27/2019   Patient delivered a Viable infant.04/27/2019  Details of operation can be found in separate operative note.  Pateint had an uncomplicated postpartum course. She received Feraheme for anemia. BP's stable. Staples placed and will be removed along with Prevena at 1 week post-op visit in clinic. IUD placed intraoperatively. She is ambulating, tolerating a regular diet, passing flatus, and urinating well. Patient is discharged home in stable condition on  04/28/19        Delivery time: 10:56 AM    Magnesium Sulfate received: No BMZ received: No Rhophylac:No MMR:No Transfusion:No  Physical exam  Vitals:   04/27/19 2055 04/28/19 0111 04/28/19 0516 04/28/19 1326  BP: (!) 121/54 (!) 94/55 (!) 100/46 (!) 114/57  Pulse: 70 79 72 79  Resp: 16 17 16 18   Temp: 98 F (36.7 C) 98 F (36.7 C) 98 F (36.7 C) 97.8 F (36.6 C)  TempSrc: Oral Oral Oral Axillary  SpO2: 100% 100% 100% 99%  Weight:      Height:       General: alert, cooperative and no  distress Lochia: appropriate Uterine Fundus: firm Incision: Healing well with no significant drainage, No significant erythema, Dressing is clean, dry, and intact DVT Evaluation: No evidence of DVT seen on physical exam. Labs: Lab Results  Component Value Date   WBC 7.3 04/28/2019   HGB 7.6 (L) 04/28/2019   HCT 25.8 (L) 04/28/2019   MCV 77.9 (L) 04/28/2019   PLT 209 04/28/2019   CMP Latest Ref Rng & Units 04/27/2019  Glucose 65 - 99 mg/dL -  BUN 6 - 20 mg/dL -  Creatinine 0.44 - 1.00 mg/dL 0.53  Sodium 134 - 144 mmol/L -  Potassium 3.5 - 5.2 mmol/L -  Chloride 96 - 106 mmol/L -  CO2 20 - 29 mmol/L -  Calcium 8.7 - 10.2 mg/dL -  Total Protein 6.0 - 8.5 g/dL -  Total Bilirubin 0.0 - 1.2 mg/dL -  Alkaline Phos 39 - 117 IU/L -  AST 0 - 40 IU/L -  ALT 0 - 32 IU/L -    Discharge instruction: per After Visit Summary  and "Baby and Me Booklet".  After visit meds:  Allergies as of 04/28/2019      Reactions   Hydrocodone Hives      Medication List    STOP taking these medications   Blood Pressure Monitor Misc   cephALEXin 500 MG capsule Commonly known as: KEFLEX   omeprazole 20 MG capsule Commonly known as: PRILOSEC     TAKE these medications   acetaminophen 500 MG tablet Commonly known as: TYLENOL Take 2 tablets (1,000 mg total) by mouth every 8 (eight) hours as needed.   ibuprofen 600 MG tablet Commonly known as: ADVIL Take 1 tablet (600 mg total) by mouth every 6 (six) hours as needed for fever or headache.   oxycodone 5 MG capsule Commonly known as: OXY-IR Take 1 capsule (5 mg total) by mouth every 4 (four) hours as needed.       Diet: routine diet  Activity: Advance as tolerated. Pelvic rest for 6 weeks.   Outpatient follow up:4 weeks Follow up Appt: Future Appointments  Date Time Provider [place]  05/05/2019  1:50 PM Florian Buff, MD [facility]   Follow up Visit:   Please schedule this patient for Postpartum visit in: 4 weeks  with the following provider: Any provider For C/S patients schedule nurse incision check in weeks 2 weeks: yes Low risk pregnancy complicated by: HTN Delivery mode:  CS Anticipated Birth Control:  IUD PP Procedures needed: Incision and BP check  Schedule Integrated BH visit: yes   Newborn Data: Live born female  Birth Weight: 10 lb 7.8 oz (4757 g) APGAR: 67, 9  Newborn Delivery   Birth date/time: 04/27/2019 10:56:00 Delivery type: C-Section, Low Transverse Trial of labor: No C-section categorization: Repeat      Baby Feeding: Breast Disposition:home with mother   04/28/2019 Chauncey Mann, MD

## 2019-04-27 NOTE — Anesthesia Preprocedure Evaluation (Addendum)
Anesthesia Evaluation  Patient identified by MRN, date of birth, ID band Patient awake    Reviewed: Allergy & Precautions, H&P , NPO status , Patient's Chart, lab work & pertinent test results, reviewed documented beta blocker date and time   Airway Mallampati: III  TM Distance: >3 FB Neck ROM: full    Dental no notable dental hx. (+) Teeth Intact   Pulmonary sleep apnea ,    Pulmonary exam normal breath sounds clear to auscultation       Cardiovascular Exercise Tolerance: Good Normal cardiovascular exam Rhythm:regular Rate:Normal     Neuro/Psych    GI/Hepatic Neg liver ROS, GERD  Medicated,  Endo/Other  Morbid obesity  Renal/GU negative Renal ROS     Musculoskeletal   Abdominal (+) + obese,   Peds  Hematology  (+) anemia ,   Anesthesia Other Findings   Reproductive/Obstetrics                             Anesthesia Physical  Anesthesia Plan  ASA: III  Anesthesia Plan: Combined Spinal and Epidural   Post-op Pain Management:    Induction:   PONV Risk Score and Plan: 3 and Ondansetron, Dexamethasone and Scopolamine patch - Pre-op  Airway Management Planned: Nasal Cannula, Natural Airway and Simple Face Mask  Additional Equipment: None  Intra-op Plan:   Post-operative Plan:   Informed Consent: I have reviewed the patients History and Physical, chart, labs and discussed the procedure including the risks, benefits and alternatives for the proposed anesthesia with the patient or authorized representative who has indicated his/her understanding and acceptance.       Plan Discussed with: CRNA  Anesthesia Plan Comments: (  )       Anesthesia Quick Evaluation

## 2019-04-27 NOTE — H&P (Signed)
Sierra Heath is a 28 y.o. female presenting for 6533w4d Estimated Date of Delivery: 04/30/19 previous C section x 2 declines TOL Borderline CHTN, no meds Feal macrosomia suspected. OB History    Gravida  3   Para  2   Term  2   Preterm      AB      Living  2     SAB      TAB      Ectopic      Multiple  0   Live Births  2          Past Medical History:  Diagnosis Date  . Acid reflux   . ADHD   . Anxiety   . Back pain, chronic   . BV (bacterial vaginosis) 09/11/2014  . Contraceptive management 09/11/2014  . Depression   . Diarrhea 10/11/2014  . Hemorrhoids 10/03/2014  . History of cesarean section 12/25/2015  . History of chlamydia   . Migraine   . Nausea 10/11/2014  . Obesity   . Pregnant 12/25/2015  . Rectal fissure 10/03/2014  . Sleep apnea   . Sleep apnea   . Vaginal discharge 09/11/2014   Past Surgical History:  Procedure Laterality Date  . CESAREAN SECTION    . CESAREAN SECTION N/A 08/17/2016   Procedure: CESAREAN SECTION;  Surgeon: Catalina AntiguaPeggy Constant, MD;  [facility];  Service: Obstetrics;  Laterality: N/A;  repeat  . HERNIA REPAIR     age 89  . TONSILLECTOMY    . WISDOM TOOTH EXTRACTION     Family History: family history includes COPD in her maternal grandmother and paternal grandmother; Cancer in her paternal grandmother; Diabetes in her paternal grandmother; Down syndrome in her maternal aunt; Heart attack in her father; Hyperlipidemia in her mother; Hypertension in her mother. Social History:  reports that she has never smoked. She has never used smokeless tobacco. She reports current drug use. Frequency: 1.00 time per week. Drug: Marijuana. She reports that she does not drink alcohol.     Maternal Diabetes: No Genetic Screening: Normal Maternal Ultrasounds/Referrals: Other:suspected macrosomia Fetal Ultrasounds or other Referrals:  None Maternal Substance Abuse:  No Significant Maternal Medications:  None Significant Maternal Lab  Results:  Other: anemia Other Comments:  None  ROS   Review of Systems  Constitutional: Negative for fever, chills, weight loss, malaise/fatigue and diaphoresis.  HENT: Negative for hearing loss, ear pain, nosebleeds, congestion, sore throat, neck pain, tinnitus and ear discharge.   Eyes: Negative for blurred vision, double vision, photophobia, pain, discharge and redness.  Respiratory: Negative for cough, hemoptysis, sputum production, shortness of breath, wheezing and stridor.   Cardiovascular: Negative for chest pain, palpitations, orthopnea, claudication, leg swelling and PND.  Gastrointestinal: negative for abdominal pain. Negative for heartburn, nausea, vomiting, diarrhea, constipation, blood in stool and melena.  Genitourinary: Negative for dysuria, urgency, frequency, hematuria and flank pain.  Musculoskeletal: Negative for myalgias, back pain, joint pain and falls.  Skin: Negative for itching and rash.  Neurological: Negative for dizziness, tingling, tremors, sensory change, speech change, focal weakness, seizures, loss of consciousness, weakness and headaches.  Endo/Heme/Allergies: Negative for environmental allergies and polydipsia. Does not bruise/bleed easily.  Psychiatric/Behavioral: Negative for depression, suicidal ideas, hallucinations, memory loss and substance abuse. The patient is not nervous/anxious and does not have insomnia.      History  Past Medical History:  Diagnosis Date  . Acid reflux   . ADHD   . Anxiety   . Back pain,  chronic   . BV (bacterial vaginosis) 09/11/2014  . Contraceptive management 09/11/2014  . Depression   . Diarrhea 10/11/2014  . Hemorrhoids 10/03/2014  . History of cesarean section 12/25/2015  . History of chlamydia   . Migraine   . Nausea 10/11/2014  . Obesity   . Pregnant 12/25/2015  . Rectal fissure 10/03/2014  . Sleep apnea   . Sleep apnea   . Vaginal discharge 09/11/2014    Past Surgical History:  Procedure Laterality Date  .  CESAREAN SECTION    . CESAREAN SECTION N/A 08/17/2016   Procedure: CESAREAN SECTION;  Surgeon: Catalina Antigua, MD;  Location: [facility];  Service: Obstetrics;  Laterality: N/A;  repeat  . HERNIA REPAIR     age 11  . TONSILLECTOMY    . WISDOM TOOTH EXTRACTION      OB History    Gravida  3   Para  2   Term  2   Preterm      AB      Living  2     SAB      TAB      Ectopic      Multiple  0   Live Births  2           Allergies  Allergen Reactions  . Hydrocodone Hives    Social History   Socioeconomic History  . Marital status: Single    Spouse name: Not on file  . Number of children: 2  . Years of education: Not on file  . Highest education level: Not on file  Occupational History  . Not on file  Social Needs  . Financial resource strain: Not hard at all  . Food insecurity    Worry: Never true    Inability: Never true  . Transportation needs    Medical: No    Non-medical: No  Tobacco Use  . Smoking status: Never Smoker  . Smokeless tobacco: Never Used  Substance and Sexual Activity  . Alcohol use: No  . Drug use: Yes    Frequency: 1.0 times per week    Types: Marijuana  . Sexual activity: Yes    Birth control/protection: None  Lifestyle  . Physical activity    Days per week: Not on file    Minutes per session: Not on file  . Stress: Not on file  Relationships  . Social Musician on phone: Not on file    Gets together: Not on file    Attends religious service: Not on file    Active member of club or organization: Not on file    Attends meetings of clubs or organizations: Not on file    Relationship status: Not on file  Other Topics Concern  . Not on file  Social History Narrative  . Not on file    Family History  Problem Relation Age of Onset  . Heart attack Father   . Hypertension Mother   . Hyperlipidemia Mother   . COPD Maternal Grandmother   . Cancer Paternal Grandmother   . Diabetes Paternal Grandmother    . COPD Paternal Grandmother   . Down syndrome Maternal Aunt   . Colon cancer Neg Hx       Last menstrual period 07/24/2018. Exam Physical Exam  Physical Exam  Vitals reviewed. Constitutional: She is oriented to person, place, and time. She appears well-developed and well-nourished.  HENT:  Head: Normocephalic and atraumatic.  Right Ear: External ear normal.  Left Ear: External ear normal.  Nose: Nose normal.  Mouth/Throat: Oropharynx is clear and moist.  Eyes: Conjunctivae and EOM are normal. Pupils are equal, round, and reactive to light. Right eye exhibits no discharge. Left eye exhibits no discharge. No scleral icterus.  Neck: Normal range of motion. Neck supple. No tracheal deviation present. No thyromegaly present.  Cardiovascular: Normal rate, regular rhythm, normal heart sounds and intact distal pulses.  Exam reveals no gallop and no friction rub.   No murmur heard. Respiratory: Effort normal and breath sounds normal. No respiratory distress. She has no wheezes. She has no rales. She exhibits no tenderness.  GI: Soft. Bowel sounds are normal. She exhibits no distension and no mass. There is tenderness. There is no rebound and no guarding.  Genitourinary:       Vulva is normal without lesions Vagina is pink moist without discharge Cervix normal in appearance and pap is normal Uterus is size consistent with term weeks Adnexa is negative with normal sized ovaries by sonogram  Musculoskeletal: Normal range of motion. She exhibits no edema and no tenderness.  Neurological: She is alert and oriented to person, place, and time. She has normal reflexes. She displays normal reflexes. No cranial nerve deficit. She exhibits normal muscle tone. Coordination normal.  Skin: Skin is warm and dry. No rash noted. No erythema. No pallor.  Psychiatric: She has a normal mood and affect. Her behavior is normal. Judgment and thought content normal.     Results for orders placed or performed  during the hospital encounter of 04/25/19 (from the past 48 hour(s))  SARS Coronavirus 2 [facility] order, Performed in [facility] hospital lab) Nasopharyngeal Nasopharyngeal Swab     Status: None   Collection Time: 04/25/19 10:16 AM   Specimen: Nasopharyngeal Swab  Result Value Ref Range   SARS Coronavirus 2 NEGATIVE NEGATIVE    Comment: (NOTE) If result is NEGATIVE SARS-CoV-2 target nucleic acids are NOT DETECTED. The SARS-CoV-2 RNA is generally detectable in upper and lower  respiratory specimens during the acute phase of infection. The lowest  concentration of SARS-CoV-2 viral copies this assay can detect is 250  copies / mL. A negative result does not preclude SARS-CoV-2 infection  and should not be used as the sole basis for treatment or other  patient management decisions.  A negative result may occur with  improper specimen collection / handling, submission of specimen other  than nasopharyngeal swab, presence of viral mutation(s) within the  areas targeted by this assay, and inadequate number of viral copies  (<250 copies / mL). A negative result must be combined with clinical  observations, patient history, and epidemiological information. If result is POSITIVE SARS-CoV-2 target nucleic acids are DETECTED. The SARS-CoV-2 RNA is generally detectable in upper and lower  respiratory specimens dur ing the acute phase of infection.  Positive  results are indicative of active infection with SARS-CoV-2.  Clinical  correlation with patient history and other diagnostic information is  necessary to determine patient infection status.  Positive results do  not rule out bacterial infection or co-infection with other viruses. If result is PRESUMPTIVE POSTIVE SARS-CoV-2 nucleic acids MAY BE PRESENT.   A presumptive positive result was obtained on the submitted specimen  and confirmed on repeat testing.  While 2019 novel coronavirus  (SARS-CoV-2) nucleic acids may be present in the  submitted sample  additional confirmatory testing may be necessary for epidemiological  and / or clinical management purposes  to differentiate between  SARS-CoV-2 and  other Sarbecovirus currently known to infect humans.  If clinically indicated additional testing with an alternate test  methodology [phone]) is advised. The SARS-CoV-2 RNA is generally  detectable in upper and lower respiratory sp ecimens during the acute  phase of infection. The expected result is Negative. Fact Sheet for Patients:  StrictlyIdeas.no Fact Sheet for Healthcare Providers: BankingDealers.co.za This test is not yet approved or cleared by the [place] FDA and has been authorized for detection and/or diagnosis of SARS-CoV-2 by FDA under an Emergency Use Authorization (EUA).  This EUA will remain in effect (meaning this test can be used) for the duration of the COVID-19 declaration under Section 564(b)(1) of the Act, 21 U.S.C. section 360bbb-3(b)(1), unless the authorization is terminated or revoked sooner. Performed at [facility], [place] [address], [place], [place] [address]   CBC     Status: Abnormal   Collection Time: 04/25/19 10:32 AM  Result Value Ref Range   WBC 6.8 4.0 - 10.5 K/uL   RBC 3.90 3.87 - 5.11 MIL/uL   Hemoglobin 9.2 (L) 12.0 - 15.0 g/dL   HCT 30.0 (L) 36.0 - 46.0 %   MCV 76.9 (L) 80.0 - 100.0 fL   MCH 23.6 (L) 26.0 - 34.0 pg   MCHC 30.7 30.0 - 36.0 g/dL   RDW 16.6 (H) 11.5 - 15.5 %   Platelets 223 150 - 400 K/uL   nRBC 0.0 0.0 - 0.2 %    Comment: Performed at [facility], [place] [address], [place], [place] [address]  Type and screen [place]     Status: None   Collection Time: 04/25/19 10:32 AM  Result Value Ref Range   ABO/RH(D) O POS    Antibody Screen NEG    Sample Expiration      04/28/2019,2359 Performed at [facility], [place] [address] [place], [place] [address]   RPR     Status: None    Collection Time: 04/25/19 10:32 AM  Result Value Ref Range   RPR Ser Ql NON REACTIVE NON REACTIVE    Comment: Performed at [facility], [place] [address], [place], [place] [address]  ABO/Rh     Status: None   Collection Time: 04/25/19 10:32 AM  Result Value Ref Range   ABO/RH(D)      O POS Performed at [place] [address] [place], [place] [address]     Prenatal labs: ABO, Rh: --/--/O POS, O POS Performed at [facility], [place] [address] [place],  [address]  [phone]/05 1032) Antibody: NEG (10/05 1032) Rubella: <0.90 (04/08 1024) RPR: NON REACTIVE (10/05 1032)  HBsAg: Negative (04/08 1024)  HIV: Non Reactive (07/22 3762)  GBS: Negative/-- (09/17 1350)   Assessment/Plan: [redacted]w[redacted]d Estimated Date of Delivery: 04/30/19  Previous Caesarean section x 2, declines TOL CHTN, borderline, no meds Suspected fetal macrosomia Desires post placental IUD placement Anemia s/p feraheme  Repeat C section with post placental IUD     Florian Buff 04/27/2019, 9:18 AM

## 2019-04-27 NOTE — Anesthesia Procedure Notes (Signed)
Spinal  Patient location during procedure: OR Start time: 04/27/2019 10:20 AM End time: 04/27/2019 10:27 AM Staffing Anesthesiologist: Lyn Hollingshead, MD Performed: anesthesiologist  Preanesthetic Checklist Completed: patient identified, site marked, surgical consent, pre-op evaluation, timeout performed, IV checked, risks and benefits discussed and monitors and equipment checked Spinal Block Patient position: sitting Prep: site prepped and draped and DuraPrep Patient monitoring: cardiac monitor, continuous pulse ox, blood pressure and heart rate Approach: midline Location: L3-4 Injection technique: catheter Needle Needle type: Tuohy and Sprotte  Needle gauge: 24 G Needle length: 12.7 cm Needle insertion depth: 9 cm Catheter type: closed end flexible Catheter size: 19 g Catheter at skin depth: 15 cm Assessment Sensory level: T4

## 2019-04-27 NOTE — Progress Notes (Signed)
Epidural catheter removed at 1435 with catheter intact.

## 2019-04-27 NOTE — Op Note (Signed)
Cesarean Section Procedure Note   Sierra Heath  04/27/2019  Indications: Previous C-section x2   Pre-operative Diagnosis: previous c section and undesired fertility  Post-operative Diagnosis: Same   Surgeon: Surgeon(s) and Role:    * Florian Buff, MD - Primary   Assistants: Barrington Ellison, MD  Anesthesia: epidural, spinal    Estimated Blood Loss: 808 mL  Total IV Fluids: 2360ml   Urine Output: 25 mL  Specimens: None  Findings: Viable female infant in cephalic presentation. Clear amniotic fluid.  Intact placenta, three vessel cord.  Normal uterus, fallopian tubes and ovaries bilaterally. APGAR (1 MIN): 9   APGAR (5 MINS): 9   APGAR (10 MINS):    Baby condition / location:  Nursery   Complications: no complications  Indications: Sierra Heath is a 28 y.o. [id number] with an IUP [redacted]w[redacted]d who presented for scheduled repeat C-section due to history of two prior c-sections.   The risks, benefits, complications, treatment options, and expected outcomes were discussed with the patient . The patient concurred with the proposed plan, giving informed consent. identified as Sierra Heath and the procedure verified as C-Section Delivery. Discussed risks of irregular bleeding, cramping, infection, malpositioning or misplacement of the IUD outside the uterus which may require further procedure such as laparoscopy. Also discussed >99% contraception efficacy, increased risk of ectopic pregnancy with failure of method.   Procedure Details: A Time Out was held and the above information confirmed.  The patient was taken back to the operative suite where spinal-epidural anesthesia was placed. After induction of anesthesia, the patient was draped and prepped in the usual sterile manner and placed in a dorsal supine position with a leftward tilt. A transverse incision was made and carried down through the subcutaneous tissue to the fascia. Fascial incision was made and extended  transversely. The fascia was separated from the underlying rectus tissue superiorly and inferiorly. The peritoneum was identified and entered. Peritoneal incision was extended longitudinally. The utero-vesical peritoneal reflection was incised transversely and the bladder flap was sharply and then bluntly freed from the lower uterine segment. A low transverse uterine incision was made. Delivered from cephalic presentation was a 4757 gram Living newborn infant(s) or Female with Apgar scores of 9 at one minute and 9 at five minutes. Cord ph was not sent the umbilical cord was clamped and cut cord blood was obtained for evaluation. The placenta was removed Intact and appeared normal. The uterine outline, tubes and ovaries appeared normal. Liletta removed from manufacture applicator and manually inserted to uterine fundus; strings trimmed and directed into cervix. The uterine incision was closed with running locked sutures of 0 Monocryl. Hemostasis was observed. Rectus muscles loosely approximately with 3-0 Vicryl. The fascia was then reapproximated with running sutures of 0Vicryl. The skin was closed with staples. After the skin was closed, a Prevena disposable negative pressure wound therapy device was placed over the incision.  The suction was activated at a pressure of 80 mmHg.  The adhesive was affixed well and there were no leaks noted.  Instrument, sponge, and needle counts were correct prior the abdominal closure and were correct at the conclusion of the case.   Disposition: PACU - hemodynamically stable.   Maternal Condition: stable   Barrington Ellison, MD [facility] Fellow, [facility] for [employer], [place]

## 2019-04-27 NOTE — Transfer of Care (Signed)
Immediate Anesthesia Transfer of Care Note  Patient: Sierra Heath  Procedure(s) Performed: REPEAT CESAREAN SECTION (N/A Abdomen)  Patient Location: PACU  Anesthesia Type:Spinal  Level of Consciousness: awake  Airway & Oxygen Therapy: Patient Spontanous Breathing  Post-op Assessment: Report given to RN  Post vital signs: Reviewed and stable  Last Vitals:  Vitals Value Taken Time  BP    Temp    Pulse    Resp    SpO2      Last Pain: There were no vitals filed for this visit.    Patients Stated Pain Goal: 2 (21/11/73 5670)  Complications: No apparent anesthesia complications

## 2019-04-28 ENCOUNTER — Other Ambulatory Visit: Payer: Self-pay

## 2019-04-28 LAB — CBC
HCT: 25.8 % — ABNORMAL LOW (ref 36.0–46.0)
Hemoglobin: 7.6 g/dL — ABNORMAL LOW (ref 12.0–15.0)
MCH: 23 pg — ABNORMAL LOW (ref 26.0–34.0)
MCHC: 29.5 g/dL — ABNORMAL LOW (ref 30.0–36.0)
MCV: 77.9 fL — ABNORMAL LOW (ref 80.0–100.0)
Platelets: 209 10*3/uL (ref 150–400)
RBC: 3.31 MIL/uL — ABNORMAL LOW (ref 3.87–5.11)
RDW: 16.5 % — ABNORMAL HIGH (ref 11.5–15.5)
WBC: 7.3 10*3/uL (ref 4.0–10.5)
nRBC: 0 % (ref 0.0–0.2)

## 2019-04-28 LAB — BIRTH TISSUE RECOVERY COLLECTION (PLACENTA DONATION)

## 2019-04-28 MED ORDER — SODIUM BICARBONATE 8.4 % IV SOLN
INTRAVENOUS | Status: AC
  Filled 2019-04-28: qty 50

## 2019-04-28 MED ORDER — MEASLES, MUMPS & RUBELLA VAC IJ SOLR
0.5000 mL | Freq: Once | INTRAMUSCULAR | Status: AC
  Administered 2019-04-28: 0.5 mL via SUBCUTANEOUS
  Filled 2019-04-28: qty 0.5

## 2019-04-28 MED ORDER — FERROUS FUMARATE 324 (106 FE) MG PO TABS
1.0000 | ORAL_TABLET | Freq: Two times a day (BID) | ORAL | Status: DC
  Administered 2019-04-28: 106 mg via ORAL
  Filled 2019-04-28: qty 1

## 2019-04-28 MED ORDER — ACETAMINOPHEN 500 MG PO TABS: 1000.0000 mg | ORAL_TABLET | Freq: Three times a day (TID) | ORAL | 0 refills | Status: DC | PRN

## 2019-04-28 MED ORDER — IBUPROFEN 600 MG PO TABS: 600.0000 mg | ORAL_TABLET | Freq: Four times a day (QID) | ORAL | 0 refills | Status: DC | PRN

## 2019-04-28 MED ORDER — OXYCODONE HCL 5 MG PO CAPS: 5.0000 mg | ORAL_CAPSULE | ORAL | 0 refills | Status: DC | PRN

## 2019-04-28 NOTE — Progress Notes (Signed)
Subjective: Postpartum Day 1: Cesarean Delivery Patient reports feeling excellent. Foley out and she has urinated. Passing flatus. She would very much like to leave today. Bleeding is minimal. Ambulating without difficult and breastfeeding well.    Objective: Vital signs in last 24 hours: Temp:  [98 F (36.7 C)-98.9 F (37.2 C)] 98 F (36.7 C) (10/08 0516) Pulse Rate:  [65-93] 72 (10/08 0516) Resp:  [12-30] 16 (10/08 0516) BP: (79-129)/(46-80) 100/46 (10/08 0516) SpO2:  [95 %-100 %] 100 % (10/08 0516) Weight:  [146.5 kg] 146.5 kg (10/07 0930)  Physical Exam:  General: alert, cooperative, appears stated age and no distress Lochia: appropriate Uterine Fundus: firm Incision: healing well, no significant drainage, no dehiscence DVT Evaluation: No evidence of DVT seen on physical exam.  Recent Labs    04/27/19 1515 04/28/19 0450  HGB 8.3* 7.6*  HCT 26.7* 25.8*    Assessment/Plan: Status post Cesarean section. Doing well postoperatively.  Continue current care. IUD placed intraoperatively BP's normal to low; no medication needed at this time Staples and Prevena to be removed 1 week post-op by Dr. Elonda Husky; appt requested  Plan for [place] 04/28/2019, 8:40 AM

## 2019-04-28 NOTE — Clinical Social Work Maternal (Signed)
CLINICAL SOCIAL WORK MATERNAL/CHILD NOTE  Patient Details  Name: Sierra Heath MRN: [id number] Date of Birth: 04/18/1991  Date:  04/28/2019  Clinical Social Worker Initiating Note:  Sharelle Burditt Date/Time: Initiated:  04/28/19/0923     Child's Name:  Ivy Battle   Biological Parents:  Mother, Father(Cristian Hilario and Isaiah Battle DOB: 05/13/1989)   Need for Interpreter:  None   Reason for Referral:  Behavioral Health Concerns, Current Substance Use/Substance Use During Pregnancy    Address:  [address] [place] [address]    Phone number:  [phone] (home)     Additional phone number:   Household Members/Support Persons (HM/SP):   Household Member/Support Person 1, Household Member/Support Person 2, Household Member/Support Person 3   HM/SP Name Relationship DOB or Age  HM/SP -1 Zoey Battle Daughter 08/17/2016  HM/SP -2   Grandma    HM/SP -3 Aaliyah Acocella Daughter (lives with MOB's aunt in [place]) 09/24/2010  HM/SP -4        HM/SP -5        HM/SP -6        HM/SP -7        HM/SP -8          Natural Supports (not living in the home):  Parent, Immediate Family, Extended Family   Professional Supports:     Employment: Unemployed   Type of Work:     Education:  Other (comment)(MOB received her GED)   Homebound arranged:    Financial Resources:  Medicaid   Other Resources:  Food Stamps , WIC   Cultural/Religious Considerations Which May Impact Care:    Strengths:  Ability to meet basic needs , Home prepared for child , Pediatrician chosen   Psychotropic Medications:         Pediatrician:    [place]  Pediatrician List:   [place]    [place]    [place]    [place] [facility]  [place]    [place]      Pediatrician Fax Number:    Risk Factors/Current Problems:  Substance Use , Mental Health Concerns    Cognitive State:  Able to Concentrate , Alert ,  Linear Thinking    Mood/Affect:  Bright , Calm , Comfortable , Interested , Happy , Relaxed    CSW Assessment:  CSW received consult for history of anxiety and depression and for THC use during pregnancy.  CSW met with MOB to offer support and complete assessment.    MOB sitting up in bed holding infant with MGM laying on couch, when CSW entered the room. CSW introduced self and received verbal permission from MOB to complete assessment with MGM present. MOB and MGM both welcoming of CSW visit and were pleasant and engaged throughout assessment. CSW explained reason for consult to which MOB expressed understanding. MOB reported she currently lives with her grandmother and 2-year-old daughter. MOB shared she also has an 8-year-old that lives with MOB's aunt. Per MOB, when she had her first daughter she was only 18-years-old and was not ready to parent. MOB stated her father offered to take in infant. Per MOB, he had full custody of child but that he recently passed away and MOB is unaware of who technically has custody now but that she stays with MOB's aunt to stay at the same school she's been at. MOB reported she still sees child when she wants and that they are close. MOB denied any CPS involvement with   placement. MOB confirmed receiving both WIC and food stamps and is aware of process to get infant added to her plans.   CSW inquired about MOB's mental health history and MOB acknowledged a history of anxiety and depression. Per MOB, she has had anxiety since she was about 28-years-old and used to take xanax but does not anymore. MOB stated the depression didn't start until 2018 when her father passed away. MOB denied currently taking any medications. Per MOB, she was prescribed medications during her pregnancy but did not take them. MOB reported she feels her symptoms have gotten better with her pregnancy. MOB denied any previous counseling but did express interest in seeing someone. CSW to provide  list of providers in [place] area. MOB denied any previous PPD with prior pregnancy but was receptive to education. CSW provided education regarding the baby blues period vs. perinatal mood disorders. CSW recommended self-evaluation during the postpartum time period using the New Mom Checklist from Postpartum Progress and encouraged MOB to contact a medical professional if symptoms are noted at any time. MOB did not appear to be displaying any acute mental health symptoms and denied any current SI, HI or DV. MOB reported feeling well-supported by her mother, grandmother, aunt, sister and friend.   CSW inquired about MOB's substance use history and MOB stated she only "smoked weed". Per MOB, she used marijuana to help with her appetite. MOB stated last use was 3 months ago. CSW informed MOB of Hospital Drug Policy and explained UDS came back negative but that CDS would continue to monitored and a CPS report would be made if warranted. MOB stated she knows someone who had CPS involvement for this reason and denied any questions or concerns with policy. MOB reported previous CPS involvement with her oldest daughter "about 7 years ago" but stated case has been closed and denied anything since.   MOB confirmed having all essential items for infant except for a bassinet. Per MGM, they have someone who is looking for one prior to discharge. MGM reported MOB has a pack 'n' play but that it is too low for MOB to bend down and pick up from. CSW informed MOB of potential Baby Box if they were unable to find a bassinet before discharge. CSW provided review of Sudden Infant Death Syndrome (SIDS) precautions and emphasized dangers of infant sleeping in the same bed as MOB.   MOB denied any further questions, concerns or need for resources from CSW, at this time.   CSW Plan/Description:  No Further Intervention Required/No Barriers to Discharge, Sudden Infant Death Syndrome (SIDS) Education, Perinatal Mood and Anxiety  Disorder (PMADs) Education, Hospital Drug Screen Policy Information, CSW Will Continue to Monitor Umbilical Cord Tissue Drug Screen Results and Make Report if Warranted    Darin Arndt, LCSWA 04/28/2019, 9:57 AM 

## 2019-05-02 NOTE — Anesthesia Postprocedure Evaluation (Signed)
Anesthesia Post Note  Patient: Sierra Heath  Procedure(s) Performed: REPEAT CESAREAN SECTION (N/A Abdomen)     Patient location during evaluation: PACU Anesthesia Type: Spinal Level of consciousness: awake Pain management: pain level controlled Vital Signs Assessment: post-procedure vital signs reviewed and stable Respiratory status: spontaneous breathing Cardiovascular status: stable Postop Assessment: no backache, no headache, spinal receding, patient able to bend at knees and no apparent nausea or vomiting Anesthetic complications: no    Last Vitals:  Vitals:   04/28/19 0516 04/28/19 1326  BP: (!) 100/46 (!) 114/57  Pulse: 72 79  Resp: 16 18  Temp: 36.7 C 36.6 C  SpO2: 100% 99%    Last Pain:  Vitals:   04/28/19 1420  TempSrc:   PainSc: 0-No pain   Pain Goal: Patients Stated Pain Goal: 2 (04/27/19 0930)                 Huston Foley

## 2019-05-04 ENCOUNTER — Telehealth: Payer: Self-pay | Admitting: *Deleted

## 2019-05-04 ENCOUNTER — Encounter: Payer: Self-pay | Admitting: *Deleted

## 2019-05-04 ENCOUNTER — Other Ambulatory Visit: Payer: Self-pay | Admitting: Obstetrics & Gynecology

## 2019-05-04 MED ORDER — OXYCODONE HCL 5 MG PO CAPS: 5.0000 mg | ORAL_CAPSULE | ORAL | 0 refills | Status: DC | PRN

## 2019-05-04 NOTE — Telephone Encounter (Signed)
Pt requesting refill on pain meds

## 2019-05-04 NOTE — Telephone Encounter (Signed)
Eprescribed.

## 2019-05-04 NOTE — Telephone Encounter (Signed)
Pt had c section last Wednesday. Pt is requesting a refill on Oxycodone. She has 1 pill left. Has been taking it twice a day. Please advise. Thanks!! [place]

## 2019-05-05 ENCOUNTER — Other Ambulatory Visit: Payer: Self-pay

## 2019-05-05 ENCOUNTER — Encounter: Payer: Self-pay | Admitting: Obstetrics & Gynecology

## 2019-05-05 ENCOUNTER — Ambulatory Visit (INDEPENDENT_AMBULATORY_CARE_PROVIDER_SITE_OTHER): Payer: Medicaid Other | Admitting: Obstetrics & Gynecology

## 2019-05-05 VITALS — BP 130/86 | HR 83 | Ht 66.0 in | Wt 301.0 lb

## 2019-05-05 DIAGNOSIS — O9089 Other complications of the puerperium, not elsewhere classified: Secondary | ICD-10-CM

## 2019-05-05 DIAGNOSIS — Z98891 History of uterine scar from previous surgery: Secondary | ICD-10-CM

## 2019-05-05 NOTE — Progress Notes (Signed)
  HPI: Patient returns for routine postoperative follow-up having undergone repeat C section 04/27/2019  The patient's immediate postoperative recovery has been unremarkable. Since hospital discharge the patient reports some wound drainage is present.   Current Outpatient Medications: ibuprofen (ADVIL) 600 MG tablet, Take 1 tablet (600 mg total) by mouth every 6 (six) hours as needed for fever or headache., Disp: 30 tablet, Rfl: 0 oxycodone (OXY-IR) 5 MG capsule, Take 1 capsule (5 mg total) by mouth every 4 (four) hours as needed., Disp: 15 capsule, Rfl: 0 acetaminophen (TYLENOL) 500 MG tablet, Take 2 tablets (1,000 mg total) by mouth every 8 (eight) hours as needed. (Patient not taking: Reported on 05/05/2019), Disp: 30 tablet, Rfl: 0  No current facility-administered medications for this visit.     Blood pressure 130/86, pulse 83, height 5\' 6"  (1.676 m), weight (!) 301 lb (136.5 kg), last menstrual period 07/24/2018, unknown if currently breastfeeding.  Physical Exam: Significant moisture changes 1/2 staples removed, provena already came off Seroma fluid was escaping, so I used a Q tip to make slightly larger opening and drained a large amount of blood tinged straw fluid, required 3 towels to handle the amount of fluid, there is not associated wound infection but skin changes consistent with moisture and yeast Painted with Gentian violet  Diagnostic Tests:   Pathology:   Impression: S/p repeat C section Post op seroma  Plan: Remove reaminder of staples in 4 days Local wound care instructions given  Follow up: 05/09/2019 @ 1000 with Dr Elonda Husky    Florian Buff, MD

## 2019-05-09 ENCOUNTER — Ambulatory Visit (INDEPENDENT_AMBULATORY_CARE_PROVIDER_SITE_OTHER): Payer: Medicaid Other | Admitting: Obstetrics & Gynecology

## 2019-05-09 ENCOUNTER — Other Ambulatory Visit: Payer: Self-pay

## 2019-05-09 ENCOUNTER — Encounter: Payer: Self-pay | Admitting: Obstetrics & Gynecology

## 2019-05-09 DIAGNOSIS — O9089 Other complications of the puerperium, not elsewhere classified: Secondary | ICD-10-CM

## 2019-05-09 DIAGNOSIS — Z98891 History of uterine scar from previous surgery: Secondary | ICD-10-CM

## 2019-05-09 NOTE — Progress Notes (Signed)
  HPI: Patient returns for routine postoperative follow-up having undergone repeat C section 04/27/2019  The patient's immediate postoperative recovery has been unremarkable. Since hospital discharge the patient reports some wound drainage is present.   Current Outpatient Medications: ibuprofen (ADVIL) 600 MG tablet, Take 1 tablet (600 mg total) by mouth every 6 (six) hours as needed for fever or headache., Disp: 30 tablet, Rfl: 0 oxycodone (OXY-IR) 5 MG capsule, Take 1 capsule (5 mg total) by mouth every 4 (four) hours as needed., Disp: 15 capsule, Rfl: 0 acetaminophen (TYLENOL) 500 MG tablet, Take 2 tablets (1,000 mg total) by mouth every 8 (eight) hours as needed. (Patient not taking: Reported on 05/05/2019), Disp: 30 tablet, Rfl: 0  No current facility-administered medications for this visit.     Blood pressure 130/86, pulse 83, height 5\' 6"  (1.676 m), weight (!) 301 lb (136.5 kg), last menstrual period 07/24/2018, unknown if currently breastfeeding.  Physical Exam: Significant moisture changes 1/2 staples removed, provena already came off Seroma fluid was escaping, so I used a Q tip to make slightly larger opening and drained a large amount of blood tinged straw fluid, required 3 towels to handle the amount of fluid, there is not associated wound infection but skin changes consistent with moisture and yeast Painted with Gentian violet  Diagnostic Tests:   Pathology:   Impression: S/p repeat C section Post op seroma  Plan: Remove reaminder of staples in 4 days Local wound care instructions given  Follow up: 1 week with Dr Elonda Husky    Florian Buff, MD

## 2019-05-10 ENCOUNTER — Telehealth: Payer: Self-pay | Admitting: *Deleted

## 2019-05-10 NOTE — Telephone Encounter (Signed)
Pt says that Dr. Elonda Husky told her would send in a prescription for her later yesterday afternoon. Pt states that pharmacy hasn't received yet and asked if I would send message to Dr. Elonda Husky to remind him.

## 2019-05-11 ENCOUNTER — Other Ambulatory Visit: Payer: Self-pay | Admitting: Obstetrics and Gynecology

## 2019-05-11 ENCOUNTER — Telehealth: Payer: Self-pay | Admitting: *Deleted

## 2019-05-11 MED ORDER — OXYCODONE HCL 5 MG PO CAPS: 5.0000 mg | ORAL_CAPSULE | ORAL | 0 refills | Status: DC | PRN

## 2019-05-11 NOTE — Telephone Encounter (Signed)
Pt is requesting a refill on her pain med. Please advise. Thanks!! [place]

## 2019-05-11 NOTE — Telephone Encounter (Signed)
Oxycodone refilled x 15 tabs.

## 2019-05-11 NOTE — Progress Notes (Signed)
refil of oxycodone x 15 tabs sent in.

## 2019-05-11 NOTE — Telephone Encounter (Signed)
Pt states that Dr. Elonda Husky was supposed to refill her pain meds but it hasn't been sent in yet.

## 2019-05-12 NOTE — Telephone Encounter (Signed)
Pt aware Oxycodone was refilled. [place]

## 2019-05-16 ENCOUNTER — Encounter: Payer: Medicaid Other | Admitting: Obstetrics & Gynecology

## 2019-05-20 ENCOUNTER — Encounter: Payer: Self-pay | Admitting: Obstetrics & Gynecology

## 2019-05-20 ENCOUNTER — Ambulatory Visit (INDEPENDENT_AMBULATORY_CARE_PROVIDER_SITE_OTHER): Payer: Medicaid Other | Admitting: Obstetrics & Gynecology

## 2019-05-20 ENCOUNTER — Other Ambulatory Visit: Payer: Self-pay

## 2019-05-20 VITALS — BP 109/58 | HR 80 | Ht 66.0 in | Wt 300.0 lb

## 2019-05-20 DIAGNOSIS — Z98891 History of uterine scar from previous surgery: Secondary | ICD-10-CM

## 2019-05-20 NOTE — Progress Notes (Signed)
  HPI: Patient returns for routine postoperative follow-up having undergone repeat C section  on 04/27/2019.  The patient's immediate postoperative recovery has been unremarkable. Since hospital discharge the patient reports wound care follow up.   Current Outpatient Medications: ibuprofen (ADVIL) 600 MG tablet, Take 1 tablet (600 mg total) by mouth every 6 (six) hours as needed for fever or headache., Disp: 30 tablet, Rfl: 0 oxycodone (OXY-IR) 5 MG capsule, Take 1 capsule (5 mg total) by mouth every 4 (four) hours as needed., Disp: 15 capsule, Rfl: 0 acetaminophen (TYLENOL) 500 MG tablet, Take 2 tablets (1,000 mg total) by mouth every 8 (eight) hours as needed. (Patient not taking: Reported on 05/05/2019), Disp: 30 tablet, Rfl: 0  No current facility-administered medications for this visit.     Blood pressure (!) 109/58, pulse 80, height 5\' 6"  (1.676 m), weight 300 lb (136.1 kg), not currently breastfeeding.  Physical Exam: Incision looks good some moisture of course Rest of staples removed Gentian violet placed  Diagnostic Tests:   Pathology:   Impression: S/p repeat C section pp visit  Plan:   Follow up: 3  weeks  Florian Buff, MD

## 2019-05-31 ENCOUNTER — Other Ambulatory Visit: Payer: Self-pay | Admitting: Family Medicine

## 2019-06-09 ENCOUNTER — Ambulatory Visit: Payer: Medicaid Other | Admitting: Advanced Practice Midwife

## 2019-06-09 ENCOUNTER — Telehealth: Payer: Self-pay | Admitting: *Deleted

## 2019-06-09 NOTE — Telephone Encounter (Signed)
Patient has questions regarding her IUD

## 2019-06-09 NOTE — Telephone Encounter (Signed)
Sierra Heath states she is having spotting occasionally since getting the IUD placed and she "does not like not knowing when she has a period".   Informed Sierra Heath that some women do not have a monthly period, some only spot and some have a period every month.  Advised to discuss with Manus Gunning at her visit on Monday and that she will also trim the strings.  Pt asked if pregnancy tests were done at postpartum visits as she had sex 2 weeks ago and wants to make sure she is not pregnant.  Advised we can do that if needed but for her to also check her IUD strings to verify it is in place.  Pt verbalized understanding with no further questions.

## 2019-06-13 ENCOUNTER — Ambulatory Visit: Payer: Medicaid Other | Admitting: Advanced Practice Midwife

## 2019-06-20 ENCOUNTER — Ambulatory Visit (INDEPENDENT_AMBULATORY_CARE_PROVIDER_SITE_OTHER): Payer: Medicaid Other | Admitting: Advanced Practice Midwife

## 2019-06-20 ENCOUNTER — Encounter: Payer: Self-pay | Admitting: Advanced Practice Midwife

## 2019-06-20 ENCOUNTER — Other Ambulatory Visit: Payer: Self-pay

## 2019-06-20 DIAGNOSIS — Z1389 Encounter for screening for other disorder: Secondary | ICD-10-CM

## 2019-06-20 DIAGNOSIS — Z975 Presence of (intrauterine) contraceptive device: Secondary | ICD-10-CM | POA: Insufficient documentation

## 2019-06-20 DIAGNOSIS — Z3202 Encounter for pregnancy test, result negative: Secondary | ICD-10-CM

## 2019-06-20 LAB — POCT URINE PREGNANCY: Preg Test, Ur: NEGATIVE

## 2019-06-20 NOTE — Progress Notes (Signed)
Sierra Heath is a 28 y.o. who presents for a postpartum visit. She is 7 weeks postpartum following a low cervical transverse Cesarean section. I have fully reviewed the prenatal and intrapartum course. The delivery was at 9 gestational weeks.  Anesthesia: spinal. Postpartum course has been uneventful. Baby's course has been uneventful. Baby is feeding by . Bleeding: no bleeding. Bowel function is normal. Bladder function is normal. Patient is not sexually active. Contraception method is postplacental Liletta.  . Hasn't had any bleeding, can't feel strings.   Postpartum depression screening: negative.   Current Outpatient Medications:  .  omeprazole (PRILOSEC) 20 MG capsule, Take 1 capsule (20 mg total) by mouth 2 (two) times daily before a meal. Needs to be seen for further refills., Disp: 60 capsule, Rfl: 0 .  acetaminophen (TYLENOL) 500 MG tablet, Take 2 tablets (1,000 mg total) by mouth every 8 (eight) hours as needed. (Patient not taking: Reported on 05/05/2019), Disp: 30 tablet, Rfl: 0 .  ibuprofen (ADVIL) 600 MG tablet, Take 1 tablet (600 mg total) by mouth every 6 (six) hours as needed for fever or headache. (Patient not taking: Reported on 06/20/2019), Disp: 30 tablet, Rfl: 0 .  oxycodone (OXY-IR) 5 MG capsule, Take 1 capsule (5 mg total) by mouth every 4 (four) hours as needed. (Patient not taking: Reported on 06/20/2019), Disp: 15 capsule, Rfl: 0  Review of Systems   Constitutional: Negative for fever and chills Eyes: Negative for visual disturbances Respiratory: Negative for shortness of breath, dyspnea Cardiovascular: Negative for chest pain or palpitations  Gastrointestinal: Negative for vomiting, diarrhea and constipation Genitourinary: Negative for dysuria and urgency Musculoskeletal: Negative for back pain, joint pain, myalgias  Neurological: Negative for dizziness and headaches    Objective:     Vitals:   06/20/19 1549  BP: 113/64  Pulse: 68   General:  alert,  cooperative and no distress   Breasts:  negative  Lungs: Normal respiratory effort  Heart:  regular rate and rhythm  Abdomen: Soft, nontender, well healed    Vulva:  normal  Vagina: normal vagina  Cervix:  closed  Corpus: Well involuted     Rectal Exam: no hemorrhoids        Assessment:    normal postpartum exam. ?IUD expulsion vs strings missing Plan:   1. Contraception: Liletta--unsure if still in. Will get formal US to check 2. Follow up in: as needed__ pap 02/01/19, normal.  Liletta good for 6 years

## 2019-06-21 ENCOUNTER — Other Ambulatory Visit: Payer: Medicaid Other

## 2019-06-24 ENCOUNTER — Other Ambulatory Visit: Payer: Self-pay | Admitting: Advanced Practice Midwife

## 2019-06-24 DIAGNOSIS — T8332XA Displacement of intrauterine contraceptive device, initial encounter: Secondary | ICD-10-CM

## 2019-06-27 ENCOUNTER — Ambulatory Visit (INDEPENDENT_AMBULATORY_CARE_PROVIDER_SITE_OTHER): Payer: Medicaid Other

## 2019-06-27 ENCOUNTER — Other Ambulatory Visit: Payer: Self-pay

## 2019-06-27 DIAGNOSIS — T8332XA Displacement of intrauterine contraceptive device, initial encounter: Secondary | ICD-10-CM

## 2019-06-27 NOTE — Progress Notes (Signed)
PELVIC US TA/TV: homogeneous anteverted uterus,wnl,EEC 11.7 mm,IUD is centrally located within the endometrium,simple right dominate follicle 2.3 x 2 x 2.2 cm,normal left ovary,no free fluid,no pain during ultrasound,ovaries appear mobile

## 2019-08-01 ENCOUNTER — Other Ambulatory Visit: Payer: Self-pay | Admitting: Family Medicine

## 2019-08-02 ENCOUNTER — Telehealth: Payer: Self-pay | Admitting: Family Medicine

## 2019-08-02 MED ORDER — OMEPRAZOLE 20 MG PO CPDR: 20.0000 mg | DELAYED_RELEASE_CAPSULE | Freq: Two times a day (BID) | ORAL | 0 refills | Status: DC

## 2019-08-02 MED ORDER — OMEPRAZOLE 20 MG PO CPDR: 20.0000 mg | DELAYED_RELEASE_CAPSULE | Freq: Two times a day (BID) | ORAL | 2 refills | Status: DC

## 2019-08-02 NOTE — Telephone Encounter (Signed)
Patient aware and verbalized understanding. °

## 2019-08-02 NOTE — Telephone Encounter (Signed)
Sent refill for omeprazole

## 2019-08-02 NOTE — Telephone Encounter (Signed)
Detinger. NTBS 30 days given 05/31/19

## 2019-08-02 NOTE — Telephone Encounter (Signed)
What is the name of the medication?  Omeprazole  Have you contacted your pharmacy to request a refill?  Yes  Which pharmacy would you like this sent to? [facility], [place]   Pt scheduled appt for 09/16/2019. Wants to be called back to confirm if Rx was sent over or not.  Patient notified that their request is being sent to the clinical staff for review and that they should receive a call once it is complete. If they do not receive a call within 24 hours they can check with their pharmacy or our office.

## 2019-08-02 NOTE — Addendum Note (Signed)
Addended by: Arville Care on: 08/02/2019 08:39 PM   Modules accepted: Orders

## 2019-09-16 ENCOUNTER — Ambulatory Visit: Payer: Medicaid Other | Admitting: Family Medicine

## 2019-09-16 ENCOUNTER — Encounter: Payer: Self-pay | Admitting: Family Medicine

## 2019-11-22 ENCOUNTER — Telehealth: Payer: Self-pay | Admitting: Family Medicine

## 2019-11-22 NOTE — Telephone Encounter (Signed)
Patient states she needs a new CPAP machine sent to [facility] in [place].  Patient aware that Dr. Louanne Skye is OFF today and he will get this message when he returns.  Patient verbalizes understanding.  Please advise

## 2019-11-23 NOTE — Telephone Encounter (Signed)
Please review and advise.

## 2019-11-24 ENCOUNTER — Telehealth: Payer: Self-pay | Admitting: Family Medicine

## 2019-11-24 NOTE — Telephone Encounter (Signed)
Was already made aware by another phone call she will need to be seen.

## 2019-11-24 NOTE — Telephone Encounter (Signed)
FYI for provider:  Patient has not been seen in our office quite a while. Patient bringing a copy of her old sleep study,(may be 29 years old), for provider review.  She is requesting new equipment and supplies.  She was advised would need an office visit and possibly a new sleep study.  She scheduled for next week,(first available appointment).  She is hoping to get scripts re-done and sent to [facility] as soon as possible.

## 2019-11-24 NOTE — Telephone Encounter (Signed)
Family will tell her Needs to be seen.

## 2019-11-24 NOTE — Telephone Encounter (Signed)
I agree that patient needs to be seen and then we can evaluate it from there.

## 2019-12-02 ENCOUNTER — Encounter: Payer: Self-pay | Admitting: Family Medicine

## 2019-12-02 ENCOUNTER — Ambulatory Visit (INDEPENDENT_AMBULATORY_CARE_PROVIDER_SITE_OTHER): Payer: Medicaid Other | Admitting: Family Medicine

## 2019-12-02 ENCOUNTER — Other Ambulatory Visit: Payer: Self-pay

## 2019-12-02 VITALS — BP 109/72 | HR 86 | Temp 97.9°F | Ht 66.0 in | Wt 302.2 lb

## 2019-12-02 DIAGNOSIS — F419 Anxiety disorder, unspecified: Secondary | ICD-10-CM | POA: Diagnosis not present

## 2019-12-02 DIAGNOSIS — F32A Depression, unspecified: Secondary | ICD-10-CM

## 2019-12-02 DIAGNOSIS — G4733 Obstructive sleep apnea (adult) (pediatric): Secondary | ICD-10-CM | POA: Diagnosis not present

## 2019-12-02 DIAGNOSIS — F329 Major depressive disorder, single episode, unspecified: Secondary | ICD-10-CM

## 2019-12-02 MED ORDER — CITALOPRAM HYDROBROMIDE 20 MG PO TABS: 20.0000 mg | ORAL_TABLET | Freq: Every day | ORAL | 1 refills | Status: DC

## 2019-12-02 NOTE — Progress Notes (Signed)
BP 109/72   Pulse 86   Temp 97.9 F (36.6 C)   Ht 5\' 6"  (1.676 m)   Wt (!) 302 lb 4 oz (137.1 kg)   SpO2 97%   BMI 48.78 kg/m    Subjective:   Patient ID: Cira Servant, female    DOB: [birth year], 28 y.o.   MRN: [id number]  HPI: ANJANAE WOEHRLE is a 29 y.o. female presenting on 12/02/2019 for Sleep Apnea (needs new CPAP mask)   HPI Patient is coming in to discuss sleep apnea and get a new machine. Patient is currently on a CPAP machine although hers broke just recently a week ago and before that she was using it every night and she feels significantly better when she uses it and she feels like she gets a lot better sleep when she uses it.  Patient has been using it for quite a few years and her last sleep study was in 2016 which showed that she had significant sleep apnea along with central and peripheral sleep apnea and was put on 15 cm H2O settings.  She has used it ever since.  She does not know if she is using on the correct settings currently but she has been using it and she uses it every night and she feels like it makes a significant difference.  Is also coming in to discuss anxiety and depression.  She says she has been feel a lot more anxious since she has had a baby and she is having postpartum depression and she is been feeling okay but a lot more down.  She denies any suicidal ideations or thoughts of hurting herself.  She is not sleeping as well either.  Relevant past medical, surgical, family and social history reviewed and updated as indicated. Interim medical history since our last visit reviewed. Allergies and medications reviewed and updated.  Review of Systems  Constitutional: Negative for chills and fever.  Eyes: Negative for visual disturbance.  Respiratory: Negative for chest tightness and shortness of breath.   Cardiovascular: Negative for chest pain and leg swelling.  Musculoskeletal: Negative for back pain and gait problem.  Skin: Negative for rash.   Neurological: Negative for light-headedness and headaches.  Psychiatric/Behavioral: Negative for agitation and behavioral problems.  All other systems reviewed and are negative.   Per HPI unless specifically indicated above   Allergies as of 12/02/2019      Reactions   Hydrocodone Hives      Medication List       Accurate as of Dec 02, 2019  4:41 PM. If you have any questions, ask your nurse or doctor.        STOP taking these medications   acetaminophen 500 MG tablet Commonly known as: TYLENOL Stopped by: Worthy Rancher, MD   ibuprofen 600 MG tablet Commonly known as: ADVIL Stopped by: Fransisca Kaufmann Dettinger, MD   oxycodone 5 MG capsule Commonly known as: OXY-IR Stopped by: Worthy Rancher, MD     TAKE these medications   citalopram 20 MG tablet Commonly known as: CeleXA Take 1 tablet (20 mg total) by mouth daily. Started by: Worthy Rancher, MD   omeprazole 20 MG capsule Commonly known as: PRILOSEC Take 1 capsule (20 mg total) by mouth 2 (two) times daily before a meal. Needs to be seen for further refills.            Durable Medical Equipment  (From admission, onward)  Start     Ordered   12/02/19 0000  For home use only DME Other see comment    Comments: Diagnosis sleep apnea 15 cm of H2O pressure, nasal CPAP mask with nasal pillows and tubing and supplies and the CPAP machine as well.  Question:  Length of Need  Answer:  Lifetime   12/02/19 1629           Objective:   BP 109/72   Pulse 86   Temp 97.9 F (36.6 C)   Ht 5\' 6"  (1.676 m)   Wt (!) 302 lb 4 oz (137.1 kg)   SpO2 97%   BMI 48.78 kg/m   Wt Readings from Last 3 Encounters:  12/02/19 (!) 302 lb 4 oz (137.1 kg)  06/20/19 297 lb 12.8 oz (135.1 kg)  05/20/19 300 lb (136.1 kg)    Physical Exam Vitals and nursing note reviewed.  Constitutional:      General: She is not in acute distress.    Appearance: She is well-developed. She is not diaphoretic.  Eyes:      Conjunctiva/sclera: Conjunctivae normal.  Cardiovascular:     Rate and Rhythm: Normal rate and regular rhythm.     Heart sounds: Normal heart sounds. No murmur.  Pulmonary:     Effort: Pulmonary effort is normal. No respiratory distress.     Breath sounds: Normal breath sounds. No wheezing.  Musculoskeletal:        General: No tenderness. Normal range of motion.  Skin:    General: Skin is warm and dry.     Findings: No rash.  Neurological:     Mental Status: She is alert and oriented to person, place, and time.     Coordination: Coordination normal.  Psychiatric:        Mood and Affect: Mood is anxious and depressed.        Behavior: Behavior normal.       Assessment & Plan:   Problem List Items Addressed This Visit      Respiratory   Sleep apnea - Primary   Relevant Orders   For home use only DME Other see comment     Other   Anxiety and depression   Relevant Medications   citalopram (CELEXA) 20 MG tablet      Gave prescription for new machine for CPAP, also starting citalopram for anxiety Follow up plan: Return in about 4 weeks (around 12/30/2019), or if symptoms worsen or fail to improve, for anxiety and depression.  Counseling provided for all of the vaccine components Orders Placed This Encounter  Procedures  . For home use only DME Other see comment    02/29/2020, MD [facility] 12/02/2019, 4:41 PM

## 2019-12-05 ENCOUNTER — Telehealth: Payer: Self-pay | Admitting: Family Medicine

## 2019-12-05 NOTE — Telephone Encounter (Signed)
Faxed Release: [phone]

## 2019-12-08 DIAGNOSIS — G4733 Obstructive sleep apnea (adult) (pediatric): Secondary | ICD-10-CM | POA: Diagnosis not present

## 2019-12-30 ENCOUNTER — Encounter: Payer: Self-pay | Admitting: Family Medicine

## 2019-12-30 ENCOUNTER — Ambulatory Visit: Payer: Medicaid Other | Admitting: Family Medicine

## 2020-01-06 DIAGNOSIS — W19XXXA Unspecified fall, initial encounter: Secondary | ICD-10-CM | POA: Diagnosis not present

## 2020-01-06 DIAGNOSIS — R0902 Hypoxemia: Secondary | ICD-10-CM | POA: Diagnosis not present

## 2020-01-06 DIAGNOSIS — R000 Tachycardia, unspecified: Secondary | ICD-10-CM | POA: Diagnosis not present

## 2020-01-06 DIAGNOSIS — R52 Pain, unspecified: Secondary | ICD-10-CM | POA: Diagnosis not present

## 2020-01-06 DIAGNOSIS — M7989 Other specified soft tissue disorders: Secondary | ICD-10-CM | POA: Diagnosis not present

## 2020-01-06 DIAGNOSIS — S63111A Subluxation of metacarpophalangeal joint of right thumb, initial encounter: Secondary | ICD-10-CM | POA: Diagnosis not present

## 2020-02-17 DIAGNOSIS — R0989 Other specified symptoms and signs involving the circulatory and respiratory systems: Secondary | ICD-10-CM | POA: Diagnosis not present

## 2020-02-17 DIAGNOSIS — R0981 Nasal congestion: Secondary | ICD-10-CM | POA: Diagnosis not present

## 2020-02-17 DIAGNOSIS — R05 Cough: Secondary | ICD-10-CM | POA: Diagnosis not present

## 2020-02-17 DIAGNOSIS — R062 Wheezing: Secondary | ICD-10-CM | POA: Diagnosis not present

## 2020-02-24 ENCOUNTER — Other Ambulatory Visit: Payer: Self-pay | Admitting: *Deleted

## 2020-02-24 MED ORDER — OMEPRAZOLE 20 MG PO CPDR: 20.0000 mg | DELAYED_RELEASE_CAPSULE | Freq: Two times a day (BID) | ORAL | 0 refills | Status: DC

## 2020-04-11 ENCOUNTER — Other Ambulatory Visit: Payer: Self-pay | Admitting: Family Medicine

## 2020-04-11 NOTE — Telephone Encounter (Signed)
Dettinger. NTBS 30 days given 02/24/20

## 2020-04-13 NOTE — Telephone Encounter (Signed)
Attempted to call pt = wrong number in chart

## 2020-06-20 DIAGNOSIS — G4733 Obstructive sleep apnea (adult) (pediatric): Secondary | ICD-10-CM | POA: Diagnosis not present

## 2020-09-20 ENCOUNTER — Ambulatory Visit: Payer: Medicaid Other

## 2020-09-26 ENCOUNTER — Ambulatory Visit (INDEPENDENT_AMBULATORY_CARE_PROVIDER_SITE_OTHER): Payer: Medicaid Other

## 2020-09-26 ENCOUNTER — Other Ambulatory Visit: Payer: Self-pay

## 2020-09-26 DIAGNOSIS — Z111 Encounter for screening for respiratory tuberculosis: Secondary | ICD-10-CM

## 2020-09-28 ENCOUNTER — Ambulatory Visit: Payer: Medicaid Other

## 2020-10-03 ENCOUNTER — Ambulatory Visit: Payer: Medicaid Other | Admitting: Family Medicine

## 2020-10-03 ENCOUNTER — Other Ambulatory Visit: Payer: Self-pay

## 2020-10-03 NOTE — Progress Notes (Signed)
Called patient and she did not answer, left voicemail Arville Care, MD [facility] 10/03/2020, 11:04 AM

## 2020-11-15 ENCOUNTER — Ambulatory Visit: Payer: Medicaid Other | Admitting: Family Medicine

## 2020-11-23 ENCOUNTER — Encounter: Payer: Self-pay | Admitting: Family Medicine

## 2021-04-09 ENCOUNTER — Ambulatory Visit: Payer: Medicaid Other | Admitting: Adult Health

## 2021-04-27 DIAGNOSIS — J206 Acute bronchitis due to rhinovirus: Secondary | ICD-10-CM | POA: Diagnosis not present

## 2021-04-27 DIAGNOSIS — R059 Cough, unspecified: Secondary | ICD-10-CM | POA: Diagnosis not present

## 2021-04-27 DIAGNOSIS — R0789 Other chest pain: Secondary | ICD-10-CM | POA: Diagnosis not present

## 2021-04-27 DIAGNOSIS — R52 Pain, unspecified: Secondary | ICD-10-CM | POA: Diagnosis not present

## 2021-04-27 DIAGNOSIS — B341 Enterovirus infection, unspecified: Secondary | ICD-10-CM | POA: Diagnosis not present

## 2021-04-27 DIAGNOSIS — Z20822 Contact with and (suspected) exposure to covid-19: Secondary | ICD-10-CM | POA: Diagnosis not present

## 2021-04-27 DIAGNOSIS — R079 Chest pain, unspecified: Secondary | ICD-10-CM | POA: Diagnosis not present

## 2021-04-29 ENCOUNTER — Telehealth: Payer: Self-pay

## 2021-04-29 NOTE — Telephone Encounter (Signed)
Transition Care Management Unsuccessful Follow-up Telephone Call  Date of discharge and from where:  04/27/2021-[facility]  Attempts:  1st Attempt  Reason for unsuccessful TCM follow-up call:  Left voice message

## 2021-04-30 NOTE — Telephone Encounter (Signed)
Transition Care Management Unsuccessful Follow-up Telephone Call  Date of discharge and from where:  04/27/2021 from [facility]  Attempts:  2nd Attempt  Reason for unsuccessful TCM follow-up call:  Left voice message

## 2021-05-01 NOTE — Telephone Encounter (Signed)
Transition Care Management Unsuccessful Follow-up Telephone Call  Date of discharge and from where:  04/27/2021 from [facility]  Attempts:  3rd Attempt  Reason for unsuccessful TCM follow-up call:  Unable to reach patient    

## 2022-01-06 ENCOUNTER — Ambulatory Visit: Payer: Medicaid Other | Admitting: Family Medicine

## 2022-01-06 ENCOUNTER — Encounter: Payer: Self-pay | Admitting: Family Medicine

## 2022-02-25 ENCOUNTER — Telehealth: Payer: Self-pay | Admitting: Family Medicine

## 2022-02-25 ENCOUNTER — Encounter: Payer: Self-pay | Admitting: Nurse Practitioner

## 2022-02-25 ENCOUNTER — Ambulatory Visit (INDEPENDENT_AMBULATORY_CARE_PROVIDER_SITE_OTHER): Payer: Medicaid Other | Admitting: Nurse Practitioner

## 2022-02-25 VITALS — BP 122/65 | HR 84 | Temp 98.6°F | Ht 66.0 in | Wt 337.0 lb

## 2022-02-25 DIAGNOSIS — K643 Fourth degree hemorrhoids: Secondary | ICD-10-CM

## 2022-02-25 MED ORDER — KETOROLAC TROMETHAMINE 60 MG/2ML IM SOLN
60.0000 mg | Freq: Once | INTRAMUSCULAR | Status: AC
  Administered 2022-02-25: 60 mg via INTRAMUSCULAR

## 2022-02-25 MED ORDER — DESITIN RAPID RELIEF 13 % EX CREA: 1.0000 | TOPICAL_CREAM | CUTANEOUS | Status: DC | PRN

## 2022-02-25 MED ORDER — IBUPROFEN 600 MG PO TABS: 600.0000 mg | ORAL_TABLET | Freq: Three times a day (TID) | ORAL | 0 refills | Status: DC

## 2022-02-25 MED ORDER — HYDROCORTISONE ACETATE 25 MG RE SUPP: 25.0000 mg | Freq: Two times a day (BID) | RECTAL | 0 refills | Status: DC

## 2022-02-25 NOTE — Telephone Encounter (Signed)
Pt requesting Rx for CPAP machine.  Please advise. Says [facility] in [place] is telling pt that they need PCP to send over new Rx because hers expired.

## 2022-02-25 NOTE — Telephone Encounter (Signed)
Pt has not seen Dettinger since March of 2022. Patient will need to be seen for an evaluation and appropriate documentation.  Please schedule next available OV with Dr. Louanne Skye.

## 2022-02-25 NOTE — Addendum Note (Signed)
Addended by: Waynette Buttery on: 02/25/2022 04:44 PM   Modules accepted: Orders

## 2022-02-25 NOTE — Patient Instructions (Signed)
Hemorrhoids Hemorrhoids are swollen veins that may develop: In the butt (rectum). These are called internal hemorrhoids. Around the opening of the butt (anus). These are called external hemorrhoids. Hemorrhoids can cause pain, itching, or bleeding. Most of the time, they do not cause serious problems. They usually get better with diet changes, lifestyle changes, and other home treatments. What are the causes? This condition may be caused by: Having trouble pooping (constipation). Pushing hard (straining) to poop. Watery poop (diarrhea). Pregnancy. Being very overweight (obese). Sitting for long periods of time. Heavy lifting or other activity that causes you to strain. Anal sex. Riding a bike for a long period of time. What are the signs or symptoms? Symptoms of this condition include: Pain. Itching or soreness in the butt. Bleeding from the butt. Leaking poop. Swelling in the area. One or more lumps around the opening of your butt. How is this diagnosed? A doctor can often diagnose this condition by looking at the affected area. The doctor may also: Do an exam that involves feeling the area with a gloved hand (digital rectal exam). Examine the area inside your butt using a small tube (anoscope). Order blood tests. This may be done if you have lost a lot of blood. Have you get a test that involves looking inside the colon using a flexible tube with a camera on the end (sigmoidoscopy or colonoscopy). How is this treated? This condition can usually be treated at home. Your doctor may tell you to change what you eat, make lifestyle changes, or try home treatments. If these do not help, procedures can be done to remove the hemorrhoids or make them smaller. These may involve: Placing rubber bands at the base of the hemorrhoids to cut off their blood supply. Injecting medicine into the hemorrhoids to shrink them. Shining a type of light energy onto the hemorrhoids to cause them to fall  off. Doing surgery to remove the hemorrhoids or cut off their blood supply. Follow these instructions at home: Eating and drinking  Eat foods that have a lot of fiber in them. These include whole grains, beans, nuts, fruits, and vegetables. Ask your doctor about taking products that have added fiber (fibersupplements). Reduce the amount of fat in your diet. You can do this by: Eating low-fat dairy products. Eating less red meat. Avoiding processed foods. Drink enough fluid to keep your pee (urine) pale yellow. Managing pain and swelling  Take a warm-water bath (sitz bath) for 20 minutes to ease pain. Do this 3-4 times a day. You may do this in a bathtub or using a portable sitz bath that fits over the toilet. If told, put ice on the painful area. It may be helpful to use ice between your warm baths. Put ice in a plastic bag. Place a towel between your skin and the bag. Leave the ice on for 20 minutes, 2-3 times a day. General instructions Take over-the-counter and prescription medicines only as told by your doctor. Medicated creams and medicines may be used as told. Exercise often. Ask your doctor how much and what kind of exercise is best for you. Go to the bathroom when you have the urge to poop. Do not wait. Avoid pushing too hard when you poop. Keep your butt dry and clean. Use wet toilet paper or moist towelettes after pooping. Do not sit on the toilet for a long time. Keep all follow-up visits as told by your doctor. This is important. Contact a doctor if you: Have pain and   swelling that do not get better with treatment or medicine. Have trouble pooping. Cannot poop. Have pain or swelling outside the area of the hemorrhoids. Get help right away if you have: Bleeding that will not stop. Summary Hemorrhoids are swollen veins in the butt or around the opening of the butt. They can cause pain, itching, or bleeding. Eat foods that have a lot of fiber in them. These include  whole grains, beans, nuts, fruits, and vegetables. Take a warm-water bath (sitz bath) for 20 minutes to ease pain. Do this 3-4 times a day. This information is not intended to replace advice given to you by your health care provider. Make sure you discuss any questions you have with your health care provider. Document Revised: 01/16/2021 Document Reviewed: 01/16/2021 Elsevier Patient Education  2023 Elsevier Inc.  

## 2022-02-25 NOTE — Telephone Encounter (Signed)
Called and left message for patient to call the office and schedule an appt with Dr Dettinger.

## 2022-02-25 NOTE — Progress Notes (Signed)
Presenting has tried  Acute Office Visit  Subjective:     Patient ID: Sierra Heath, female    DOB: Feb 03, 1991, 31 y.o.   MRN: [id number]  Chief Complaint  Patient presents with   Hemorrhoids    This has been going on for about 10 days now , very painful     HPI Patient is in today for Hemorrhoids: Patient complains of follow up of hemorrhoids. Onset of symptoms was abrupt starting a few days ago ago with unchanged course since that time.  She describes symptoms as anorectal itching, bleeding which only occurs with bowel movements, pain with sitting, and painful defecation. Treatment to date has been OTC creams: not very effective. Patient denies maroon colored stools.   Review of Systems  Constitutional: Negative.  Negative for chills and fever.  HENT: Negative.    Eyes: Negative.   Respiratory: Negative.    Cardiovascular: Negative.   Gastrointestinal:  Positive for abdominal pain. Negative for constipation.       Patient reports right  Genitourinary: Negative.   Skin: Negative.  Negative for rash.  All other systems reviewed and are negative.       Objective:    BP 122/65   Pulse 84   Temp 98.6 F (37 C)   Ht 5\' 6"  (1.676 m)   Wt (!) 337 lb (152.9 kg)   SpO2 97%   BMI 54.39 kg/m  BP Readings from Last 3 Encounters:  02/25/22 122/65  12/02/19 109/72  06/20/19 113/64   Wt Readings from Last 3 Encounters:  02/25/22 (!) 337 lb (152.9 kg)  12/02/19 (!) 302 lb 4 oz (137.1 kg)  06/20/19 297 lb 12.8 oz (135.1 kg)      Physical Exam Vitals and nursing note reviewed.  HENT:     Head: Normocephalic.     Right Ear: External ear normal.     Left Ear: External ear normal.     Nose: Nose normal.     Mouth/Throat:     Mouth: Mucous membranes are moist.  Eyes:     Conjunctiva/sclera: Conjunctivae normal.  Cardiovascular:     Rate and Rhythm: Normal rate and regular rhythm.     Pulses: Normal pulses.     Heart sounds: Normal heart sounds.  Pulmonary:      Effort: Pulmonary effort is normal.     Breath sounds: Normal breath sounds.  Abdominal:     General: Bowel sounds are normal.     Tenderness: There is abdominal tenderness.  Genitourinary:    Rectum: Tenderness and external hemorrhoid present.       Comments: Painful hemorrhoid Neurological:     General: No focal deficit present.     Mental Status: She is alert and oriented to person, place, and time.  Psychiatric:        Mood and Affect: Mood normal.        Behavior: Behavior normal.     No results found for any visits on 02/25/22.      Assessment & Plan:  Hemorrhoid pain not well controlled in the past few days.  Patient is crying and reporting the pain of 10 out of 10.  Completed assessment.  Provided education to patient to use witch hazel pads to help reduce itching, Anusol suppository, ibuprofen anti-inflammatory, Desitin/zinc oxide cream 13%.  Increase hydration, fruits and vegetables in diet.  Patient will also benefit from weight loss and exercise to help with hemorrhoids.  Follow-up for worsening or unresolved symptoms.  Problem  List Items Addressed This Visit   None Visit Diagnoses     Grade IV hemorrhoids    -  Primary   Relevant Medications   ibuprofen (ADVIL) 600 MG tablet   hydrocortisone (ANUSOL-HC) 25 MG suppository   Zinc Oxide (DESITIN RAPID RELIEF) 13 % CREA       Meds ordered this encounter  Medications   ibuprofen (ADVIL) 600 MG tablet    Sig: Take 1 tablet (600 mg total) by mouth 3 (three) times daily.    Dispense:  30 tablet    Refill:  0    Order Specific Question:   Supervising Provider    Answer:   Standley Brooking   hydrocortisone (ANUSOL-HC) 25 MG suppository    Sig: Place 1 suppository (25 mg total) rectally 2 (two) times daily.    Dispense:  12 suppository    Refill:  0    Order Specific Question:   Supervising Provider    Answer:   Mechele Claude [[id number]]   Zinc Oxide (DESITIN RAPID RELIEF) 13 % CREA    Sig: Apply 1  Application topically as needed.    Dispense:  458 g    Order Specific Question:   Supervising Provider    Answer:   Mechele Claude [phone]    Return if symptoms worsen or fail to improve.  Daryll Drown, NP

## 2022-02-27 NOTE — Telephone Encounter (Signed)
Encounter closed, appointment scheduled for 03/07/22

## 2022-02-28 ENCOUNTER — Telehealth: Payer: Self-pay | Admitting: Nurse Practitioner

## 2022-02-28 ENCOUNTER — Other Ambulatory Visit: Payer: Self-pay | Admitting: Nurse Practitioner

## 2022-02-28 DIAGNOSIS — K643 Fourth degree hemorrhoids: Secondary | ICD-10-CM

## 2022-02-28 MED ORDER — HYDROCORTISONE (PERIANAL) 2.5 % EX CREA: 1.0000 | TOPICAL_CREAM | Freq: Two times a day (BID) | CUTANEOUS | 0 refills | Status: DC

## 2022-02-28 NOTE — Telephone Encounter (Signed)
Pt called stating that the Hemorid cream that was sent in for her is not covered by her insurance.  Wants to know if something else can be sent in that is covered by her insurance?  Please advise and call patient.

## 2022-02-28 NOTE — Telephone Encounter (Signed)
Witch hazel, Desitin cream, ill send a steroid cream

## 2022-03-07 ENCOUNTER — Encounter: Payer: Self-pay | Admitting: Family Medicine

## 2022-03-07 ENCOUNTER — Ambulatory Visit (INDEPENDENT_AMBULATORY_CARE_PROVIDER_SITE_OTHER): Payer: Medicaid Other | Admitting: Family Medicine

## 2022-03-07 VITALS — BP 113/70 | HR 83 | Temp 98.0°F | Ht 66.0 in | Wt 334.0 lb

## 2022-03-07 DIAGNOSIS — G4733 Obstructive sleep apnea (adult) (pediatric): Secondary | ICD-10-CM | POA: Diagnosis not present

## 2022-03-07 DIAGNOSIS — F32A Depression, unspecified: Secondary | ICD-10-CM | POA: Diagnosis not present

## 2022-03-07 DIAGNOSIS — K649 Unspecified hemorrhoids: Secondary | ICD-10-CM | POA: Diagnosis not present

## 2022-03-07 DIAGNOSIS — F419 Anxiety disorder, unspecified: Secondary | ICD-10-CM | POA: Diagnosis not present

## 2022-03-07 MED ORDER — FLUOXETINE HCL 20 MG PO CAPS: 20.0000 mg | ORAL_CAPSULE | Freq: Every day | ORAL | 3 refills | Status: DC

## 2022-03-07 NOTE — Progress Notes (Signed)
Ht 5\' 6"  (1.676 m)   BMI 54.39 kg/m    Subjective:   Patient ID: , female    DOB: Apr 13, 1991, 31 y.o.   MRN: [id number]  HPI: Sierra Heath is a 31 y.o. female presenting on 03/07/2022 for Medical Management of Chronic Issues, Sleep Apnea, Anxiety, Depression, and Hemorrhoids   HPI Depression and anxiety Patient is coming in to discuss depression and anxiety.  She says she been having a lot more depression feelings and anxiety feelings recently and just feels like its been overwhelming for her and this is going to school but she has been dealing with this for more than 6 years since her father passed away and she is finally ready to just try something to help.  She does have a friend take fluoxetine would like to try that.  She denies any suicidal ideations or thoughts of hurting yourself    02/25/2022    9:28 AM 12/02/2019    3:59 PM 10/27/2018    9:19 AM 07/12/2018    8:54 AM 03/15/2018    1:57 PM  Depression screen PHQ 2/9  Decreased Interest 3 1 0 3 2  Down, Depressed, Hopeless 3 1 0 3 2  PHQ - 2 Score 6 2 0 6 4  Altered sleeping 1  3 3 2   Tired, decreased energy 3  0 3 2  Change in appetite 1  0 1 0  Feeling bad or failure about yourself  3  0 0 0  Trouble concentrating 3  0 3 0  Moving slowly or fidgety/restless 1  0 1 0  Suicidal thoughts 0  0 0 0  PHQ-9 Score 18  3 17 8   Difficult doing work/chores Very difficult        Patient has had hemorrhoids off-and-on for a long time and she wants to see gastroenterologist for this because they are not improving with creams and everything.  Sleep apnea Patient's last sleep study was in 2016.  Patient does use her CPAP machine every night and says that she is got to where she cannot sleep without it, she had it more than 10 years now.  Her last sleep study was in 2016.  Her settings are she is currently on 15 cm of pressure.  Relevant past medical, surgical, family and social history reviewed and updated as  indicated. Interim medical history since our last visit reviewed. Allergies and medications reviewed and updated.  Review of Systems  Constitutional:  Negative for chills and fever.  Eyes:  Negative for visual disturbance.  Respiratory:  Negative for chest tightness and shortness of breath.   Cardiovascular:  Negative for chest pain and leg swelling.  Genitourinary:  Negative for difficulty urinating and dysuria.  Musculoskeletal:  Negative for back pain and gait problem.  Skin:  Negative for rash.  Neurological:  Negative for light-headedness and headaches.  Psychiatric/Behavioral:  Positive for dysphoric mood and sleep disturbance. Negative for agitation and behavioral problems. The patient is nervous/anxious.   All other systems reviewed and are negative.   Per HPI unless specifically indicated above   Allergies as of 03/07/2022       Reactions   Hydrocodone Hives        Medication List        Accurate as of March 07, 2022  4:12 PM. If you have any questions, ask your nurse or doctor.          Desitin Rapid Relief 13 % Crea  Generic drug: Zinc Oxide Apply 1 Application topically as needed.   FLUoxetine 20 MG capsule Commonly known as: PROZAC Take 1 capsule (20 mg total) by mouth daily. Started by: Elige Radon Ileen Kahre, MD   hydrocortisone 2.5 % rectal cream Commonly known as: Anusol-HC Place 1 Application rectally 2 (two) times daily.   ibuprofen 600 MG tablet Commonly known as: ADVIL Take 1 tablet (600 mg total) by mouth 3 (three) times daily.   omeprazole 20 MG capsule Commonly known as: PRILOSEC Take 1 capsule (20 mg total) by mouth 2 (two) times daily before a meal. Needs to be seen for further refills.               Durable Medical Equipment  (From admission, onward)           Start     Ordered   03/07/22 0000  For home use only DME continuous positive airway pressure (CPAP)       Question Answer Comment  Length of Need Lifetime    Patient has OSA or probable OSA Yes   Settings 11-15   CPAP supplies needed Mask, headgear, cushions, filters, heated tubing and water chamber      03/07/22 1610             Objective:   Ht 5\' 6"  (1.676 m)   BMI 54.39 kg/m   Wt Readings from Last 3 Encounters:  02/25/22 (!) 337 lb (152.9 kg)  12/02/19 (!) 302 lb 4 oz (137.1 kg)  06/20/19 297 lb 12.8 oz (135.1 kg)    Physical Exam Vitals and nursing note reviewed.  Constitutional:      General: She is not in acute distress.    Appearance: She is well-developed. She is not diaphoretic.  Eyes:     Conjunctiva/sclera: Conjunctivae normal.  Skin:    General: Skin is warm and dry.     Findings: No rash.  Neurological:     Mental Status: She is alert and oriented to person, place, and time.     Coordination: Coordination normal.  Psychiatric:        Mood and Affect: Mood is anxious and depressed.        Behavior: Behavior normal.        Thought Content: Thought content does not include suicidal ideation. Thought content does not include suicidal plan.       Assessment & Plan:   Problem List Items Addressed This Visit       Respiratory   Sleep apnea - Primary   Relevant Orders   For home use only DME continuous positive airway pressure (CPAP)     Other   Anxiety and depression   Relevant Medications   FLUoxetine (PROZAC) 20 MG capsule   Other Visit Diagnoses     Hemorrhoids, unspecified hemorrhoid type       Relevant Orders   Ambulatory referral to Gastroenterology       We will start Prozac to help with anxiety depression and follow-up in 1 to 2 months.  Did not a refill for her CPAP machine supplies and will refer to gastroenterology for her stomach and hemorrhoids. Follow up plan: Return if symptoms worsen or fail to improve, for 1-2 months depression.  Counseling provided for all of the vaccine components Orders Placed This Encounter  Procedures   For home use only DME continuous positive  airway pressure (CPAP)   Ambulatory referral to Gastroenterology    06/22/19, MD [facility] 03/07/2022, 4:12  PM

## 2022-03-10 ENCOUNTER — Telehealth: Payer: Self-pay | Admitting: Family Medicine

## 2022-03-10 NOTE — Telephone Encounter (Signed)
[facility] called stating that they received CPAP MACHINE order and needs [place] to send last 6 months of face to face visits and last sleep study.

## 2022-03-10 NOTE — Telephone Encounter (Signed)
Faxed through [employer]

## 2022-03-11 ENCOUNTER — Encounter (INDEPENDENT_AMBULATORY_CARE_PROVIDER_SITE_OTHER): Payer: Self-pay | Admitting: *Deleted

## 2022-03-14 DIAGNOSIS — G4733 Obstructive sleep apnea (adult) (pediatric): Secondary | ICD-10-CM | POA: Diagnosis not present

## 2022-03-21 ENCOUNTER — Other Ambulatory Visit: Payer: Self-pay | Admitting: Family Medicine

## 2022-04-18 ENCOUNTER — Ambulatory Visit: Payer: Medicaid Other | Admitting: Obstetrics & Gynecology

## 2022-04-28 ENCOUNTER — Ambulatory Visit (INDEPENDENT_AMBULATORY_CARE_PROVIDER_SITE_OTHER): Payer: Medicaid Other | Admitting: Gastroenterology

## 2022-04-28 ENCOUNTER — Encounter (INDEPENDENT_AMBULATORY_CARE_PROVIDER_SITE_OTHER): Payer: Self-pay | Admitting: Gastroenterology

## 2022-05-05 ENCOUNTER — Ambulatory Visit: Payer: Medicaid Other | Admitting: Family Medicine

## 2022-05-05 ENCOUNTER — Encounter: Payer: Self-pay | Admitting: Family Medicine

## 2022-05-20 DIAGNOSIS — G4733 Obstructive sleep apnea (adult) (pediatric): Secondary | ICD-10-CM | POA: Diagnosis not present

## 2022-06-19 DIAGNOSIS — G4733 Obstructive sleep apnea (adult) (pediatric): Secondary | ICD-10-CM | POA: Diagnosis not present

## 2022-07-20 DIAGNOSIS — G4733 Obstructive sleep apnea (adult) (pediatric): Secondary | ICD-10-CM | POA: Diagnosis not present

## 2022-07-23 ENCOUNTER — Ambulatory Visit: Payer: Medicaid Other | Admitting: Family Medicine

## 2022-07-24 ENCOUNTER — Ambulatory Visit: Payer: Medicaid Other | Admitting: Family Medicine

## 2022-07-25 ENCOUNTER — Encounter: Payer: Self-pay | Admitting: Family Medicine

## 2022-08-20 DIAGNOSIS — G4733 Obstructive sleep apnea (adult) (pediatric): Secondary | ICD-10-CM | POA: Diagnosis not present

## 2022-09-18 DIAGNOSIS — R1031 Right lower quadrant pain: Secondary | ICD-10-CM | POA: Diagnosis not present

## 2022-09-18 DIAGNOSIS — K429 Umbilical hernia without obstruction or gangrene: Secondary | ICD-10-CM | POA: Diagnosis not present

## 2022-09-18 DIAGNOSIS — G4733 Obstructive sleep apnea (adult) (pediatric): Secondary | ICD-10-CM | POA: Diagnosis not present

## 2022-09-18 DIAGNOSIS — K760 Fatty (change of) liver, not elsewhere classified: Secondary | ICD-10-CM | POA: Diagnosis not present

## 2022-09-18 DIAGNOSIS — N83201 Unspecified ovarian cyst, right side: Secondary | ICD-10-CM | POA: Diagnosis not present

## 2022-09-18 DIAGNOSIS — R197 Diarrhea, unspecified: Secondary | ICD-10-CM | POA: Diagnosis not present

## 2022-09-18 DIAGNOSIS — R161 Splenomegaly, not elsewhere classified: Secondary | ICD-10-CM | POA: Diagnosis not present

## 2022-09-18 DIAGNOSIS — N390 Urinary tract infection, site not specified: Secondary | ICD-10-CM | POA: Diagnosis not present

## 2022-09-18 DIAGNOSIS — R1084 Generalized abdominal pain: Secondary | ICD-10-CM | POA: Diagnosis not present

## 2022-10-17 DIAGNOSIS — G4733 Obstructive sleep apnea (adult) (pediatric): Secondary | ICD-10-CM | POA: Diagnosis not present

## 2022-11-21 DIAGNOSIS — G4733 Obstructive sleep apnea (adult) (pediatric): Secondary | ICD-10-CM | POA: Diagnosis not present

## 2022-12-22 DIAGNOSIS — G4733 Obstructive sleep apnea (adult) (pediatric): Secondary | ICD-10-CM | POA: Diagnosis not present

## 2023-01-12 ENCOUNTER — Ambulatory Visit: Payer: Medicaid Other | Admitting: Obstetrics & Gynecology

## 2023-01-13 ENCOUNTER — Ambulatory Visit: Payer: Medicaid Other | Admitting: Obstetrics & Gynecology

## 2023-01-21 DIAGNOSIS — G4733 Obstructive sleep apnea (adult) (pediatric): Secondary | ICD-10-CM | POA: Diagnosis not present

## 2023-02-21 DIAGNOSIS — G4733 Obstructive sleep apnea (adult) (pediatric): Secondary | ICD-10-CM | POA: Diagnosis not present

## 2023-03-20 DIAGNOSIS — H5213 Myopia, bilateral: Secondary | ICD-10-CM | POA: Diagnosis not present

## 2023-03-24 DIAGNOSIS — G4733 Obstructive sleep apnea (adult) (pediatric): Secondary | ICD-10-CM | POA: Diagnosis not present

## 2023-04-06 ENCOUNTER — Telehealth: Payer: Self-pay | Admitting: Obstetrics & Gynecology

## 2023-04-06 NOTE — Telephone Encounter (Signed)
Patient stated that they had to do ultrasound last time her strings were checked because they couldn't see them. She is wanting to know what the procedure is going to be like if they can't see the strings to get it out. I've scheduled her an appointment with Dr Despina Hidden for next week.

## 2023-04-06 NOTE — Telephone Encounter (Signed)
Called and left voicemail that I am returning her call.

## 2023-04-14 ENCOUNTER — Encounter: Payer: Medicaid Other | Admitting: Obstetrics & Gynecology

## 2023-04-23 DIAGNOSIS — G4733 Obstructive sleep apnea (adult) (pediatric): Secondary | ICD-10-CM | POA: Diagnosis not present

## 2023-06-15 ENCOUNTER — Ambulatory Visit: Payer: Self-pay | Admitting: Family Medicine

## 2023-06-15 NOTE — Telephone Encounter (Signed)
Copied from CRM [id number]. Topic: Clinical - Red Word Triage >> Jun 15, 2023  1:17 PM Sierra Heath wrote: Red Word that prompted transfer to Nurse Triage: PT Called Crying wanting to get her depression medication.

## 2023-06-15 NOTE — Telephone Encounter (Signed)
Pt has not been seen since Aug of 2023. She needs to be evaluated. Pt has an appt 11/26 wit Gennette Pac since Dettinger is booked.

## 2023-06-16 ENCOUNTER — Ambulatory Visit: Payer: Medicaid Other | Admitting: Nurse Practitioner

## 2023-06-17 ENCOUNTER — Encounter: Payer: Self-pay | Admitting: Family Medicine

## 2023-06-17 ENCOUNTER — Ambulatory Visit (INDEPENDENT_AMBULATORY_CARE_PROVIDER_SITE_OTHER): Payer: Medicaid Other | Admitting: Family Medicine

## 2023-06-17 DIAGNOSIS — F32A Depression, unspecified: Secondary | ICD-10-CM | POA: Diagnosis not present

## 2023-06-17 DIAGNOSIS — F419 Anxiety disorder, unspecified: Secondary | ICD-10-CM | POA: Diagnosis not present

## 2023-06-17 MED ORDER — OMEPRAZOLE 20 MG PO CPDR: 20.0000 mg | DELAYED_RELEASE_CAPSULE | Freq: Two times a day (BID) | ORAL | 1 refills | Status: DC

## 2023-06-17 MED ORDER — FLUOXETINE HCL 20 MG PO CAPS: 20.0000 mg | ORAL_CAPSULE | Freq: Every day | ORAL | 3 refills | Status: DC

## 2023-06-17 NOTE — Progress Notes (Signed)
Subjective:  Patient ID: Sierra Heath, female    DOB: 11-Jul-1991  Age: 32 y.o. MRN: [id number]  CC: Depression and Anxiety   HPI Sierra Heath presents for real bad depression for several months. Struggles with motivation. Will sit and stare all day. Doesn't want to do anything. Wants to be happy but doesn't know why she is unhappy. Sad all the time.Cries a lot.  Stressed over money. Having to file for child support.      06/17/2023   11:58 AM 06/17/2023   11:46 AM 06/17/2023   11:42 AM  Depression screen PHQ 2/9  Decreased Interest 3 0 0  Down, Depressed, Hopeless 3 0 0  PHQ - 2 Score 6 0 0  Altered sleeping 1    Tired, decreased energy 3    Change in appetite 3    Feeling bad or failure about yourself  3    Trouble concentrating 3    Moving slowly or fidgety/restless 1    Suicidal thoughts 0    PHQ-9 Score 20    Difficult doing work/chores Extremely dIfficult      History Sierra Heath has a past medical history of Acid reflux, ADHD, Anxiety, Back pain, chronic, BV (bacterial vaginosis) (09/11/2014), Contraceptive management (09/11/2014), Depression, Diarrhea (10/11/2014), Hemorrhoids (10/03/2014), History of cesarean section (12/25/2015), History of chlamydia, Migraine, Nausea (10/11/2014), Obesity, Pregnant (12/25/2015), Rectal fissure (10/03/2014), Sleep apnea, Sleep apnea, and Vaginal discharge (09/11/2014).   She has a past surgical history that includes Tonsillectomy; Hernia repair; Cesarean section; Wisdom tooth extraction; Cesarean section (N/A, 08/17/2016); and Cesarean section (N/A, 04/27/2019).   Her family history includes COPD in her maternal grandmother and paternal grandmother; Cancer in her paternal grandmother; Diabetes in her paternal grandmother; Down syndrome in her maternal aunt; Heart attack in her father; Hyperlipidemia in her mother; Hypertension in her mother.She reports that she has never smoked. She has never used smokeless tobacco. She reports current drug use.  Frequency: 1.00 time per week. Drug: Marijuana. She reports that she does not drink alcohol.    ROS Review of Systems  Constitutional: Negative.   HENT: Negative.    Eyes:  Negative for visual disturbance.  Respiratory:  Negative for shortness of breath.   Cardiovascular:  Negative for chest pain.  Gastrointestinal:  Negative for abdominal pain.  Musculoskeletal:  Negative for arthralgias.    Objective:  BP 118/69   Pulse 80   Temp 97.8 F (36.6 C)   Ht 5\' 6"  (1.676 m)   Wt (!) 353 lb 12.8 oz (160.5 kg)   SpO2 98%   BMI 57.10 kg/m   BP Readings from Last 3 Encounters:  06/17/23 118/69  03/07/22 113/70  02/25/22 122/65    Wt Readings from Last 3 Encounters:  06/17/23 (!) 353 lb 12.8 oz (160.5 kg)  03/07/22 (!) 334 lb (151.5 kg)  02/25/22 (!) 337 lb (152.9 kg)     Physical Exam Constitutional:      General: She is not in acute distress.    Appearance: She is well-developed.  Cardiovascular:     Rate and Rhythm: Normal rate and regular rhythm.  Pulmonary:     Breath sounds: Normal breath sounds.  Musculoskeletal:        General: Normal range of motion.  Skin:    General: Skin is warm and dry.  Neurological:     Mental Status: She is alert and oriented to person, place, and time.  Psychiatric:        Attention and Perception:  Attention normal.        Mood and Affect: Mood is depressed. Affect is labile.        Speech: Speech normal.        Behavior: Behavior normal.        Cognition and Memory: Cognition normal.       Assessment & Plan:   Sierra Heath" was seen today for depression and anxiety.  Diagnoses and all orders for this visit:  Anxiety and depression -     FLUoxetine (PROZAC) 20 MG capsule; Take 1 capsule (20 mg total) by mouth daily.  Other orders -     omeprazole (PRILOSEC) 20 MG capsule; Take 1 capsule (20 mg total) by mouth 2 (two) times daily before a meal.       I have changed Sierra Heath. Sierra Heath "Sierra Heath"'s omeprazole. I am  also having her maintain her ibuprofen, Desitin Rapid Relief, hydrocortisone, and FLUoxetine.  Allergies as of 06/17/2023       Reactions   Hydrocodone Hives        Medication List        Accurate as of June 17, 2023 11:59 PM. If you have any questions, ask your nurse or doctor.          Desitin Rapid Relief 13 % Crea Generic drug: Zinc Oxide Apply 1 Application topically as needed.   FLUoxetine 20 MG capsule Commonly known as: PROZAC Take 1 capsule (20 mg total) by mouth daily.   hydrocortisone 2.5 % rectal cream Commonly known as: Anusol-HC Place 1 Application rectally 2 (two) times daily.   ibuprofen 600 MG tablet Commonly known as: ADVIL Take 1 tablet (600 mg total) by mouth 3 (three) times daily.   omeprazole 20 MG capsule Commonly known as: PRILOSEC Take 1 capsule (20 mg total) by mouth 2 (two) times daily before a meal. What changed: See the new instructions. Changed by: Sierra Heath         Follow-up: No follow-ups on file.  Sierra Heath, M.D.

## 2023-06-24 ENCOUNTER — Ambulatory Visit: Payer: Medicaid Other | Admitting: Family Medicine

## 2023-06-26 DIAGNOSIS — G4733 Obstructive sleep apnea (adult) (pediatric): Secondary | ICD-10-CM | POA: Diagnosis not present

## 2023-07-27 ENCOUNTER — Ambulatory Visit: Payer: Medicaid Other | Admitting: Family Medicine

## 2023-07-29 ENCOUNTER — Encounter: Payer: Self-pay | Admitting: Family Medicine

## 2023-07-29 ENCOUNTER — Ambulatory Visit: Payer: Medicaid Other | Admitting: Family Medicine

## 2024-04-08 ENCOUNTER — Encounter: Payer: Self-pay | Admitting: Family Medicine

## 2024-04-08 ENCOUNTER — Ambulatory Visit

## 2024-04-08 VITALS — BP 133/75 | HR 79 | Temp 98.3°F | Ht 66.0 in | Wt 383.3 lb

## 2024-04-08 DIAGNOSIS — R635 Abnormal weight gain: Secondary | ICD-10-CM

## 2024-04-08 DIAGNOSIS — F411 Generalized anxiety disorder: Secondary | ICD-10-CM

## 2024-04-08 DIAGNOSIS — F331 Major depressive disorder, recurrent, moderate: Secondary | ICD-10-CM | POA: Diagnosis not present

## 2024-04-08 DIAGNOSIS — F439 Reaction to severe stress, unspecified: Secondary | ICD-10-CM | POA: Diagnosis not present

## 2024-04-08 DIAGNOSIS — Z59819 Housing instability, housed unspecified: Secondary | ICD-10-CM | POA: Diagnosis not present

## 2024-04-08 MED ORDER — VILAZODONE HCL 10 MG PO TABS: ORAL_TABLET | ORAL | 0 refills | Status: DC

## 2024-04-08 NOTE — Patient Instructions (Signed)
Taking the medicine as directed and not missing any doses is one of the best things you can do to treat your depression.  Here are some things to keep in mind:  Side effects (stomach upset, some increased anxiety) may happen before you notice a benefit.  These side effects typically go away over time. Changes to your dose of medicine or a change in medication all together is sometimes necessary Most people need to be on medication at least 12 months Many people will notice an improvement within two weeks but the full effect of the medication can take up to 4-6 weeks Stopping the medication when you start feeling better often results in a return of symptoms Never discontinue your medication without contacting a health care professional first.  Some medications require gradual discontinuation/ taper and can make you sick if you stop them abruptly.  If your symptoms worsen or you have thoughts of suicide/homicide, PLEASE SEEK IMMEDIATE MEDICAL ATTENTION.  You may always call:  National Suicide Hotline: 7542058722 [place]: [phone] [facility] in [place]: 2548116572   These are available 24 hours a day, 7 days a week.

## 2024-04-08 NOTE — Progress Notes (Signed)
 Subjective: CC:MDD, GAD PCP: Dettinger, Fonda LABOR, MD YEP:Joopdnw Sierra Heath is a 33 y.o. female presenting to clinic today for:  History of Present Illness   Patient presents today due to uncontrolled depression and anxiety.  She apparently has a history of this and was previously prescribed Zoloft  but never took the medication.  She was later prescribed Prozac  and she found that while it did improve her mood some it caused increased irritability and she subsequently was not consistent with it and has not taken it in over a month now.  She has never been hospitalized for mental health.  She does report alcohol use of 4 canned cocktails per day.  She has abstained from alcohol for up to 1 month in the past but notes that she resumed use of it due to stressors and to help her go to sleep.  She also uses THC occasionally.  She is a single mother, who is taking care of 2 children.  She has an older daughter that does not reside with her.  She notes that she has had increasing financial issues and will be evicted from her home soon.  She reports family history of mental health issues and her daughter, who is currently getting care at a mental health facility.  She believes her father may have had depression, possibly bipolar.  She reports concerns due to weight gain but admits that she eats a lot of junk food.  She is not under the care of any counseling but she would absolutely be glad to see a counselor if this would be helpful.  She reports feeling guilty about her mental health issues and embarrassed about her weight gain and size such that she avoids coming to the doctor and avoids going to her children schools because she does not want them to be embarrassed.  Her only sources of support currently is her aunt who lives in [place] and sister who lives in [place].  She speaks to them on the phone but otherwise is not well supported in the home setting.   ROS: Per HPI  Allergies  Allergen  Reactions   Hydrocodone  Hives   Past Medical History:  Diagnosis Date   Acid reflux    ADHD    Anxiety    Back pain, chronic    BV (bacterial vaginosis) 09/11/2014   Contraceptive management 09/11/2014   Depression    Diarrhea 10/11/2014   Hemorrhoids 10/03/2014   History of cesarean section 12/25/2015   History of chlamydia    Migraine    Nausea 10/11/2014   Obesity    Pregnant 12/25/2015   Rectal fissure 10/03/2014   Sleep apnea    Sleep apnea    Vaginal discharge 09/11/2014    Current Outpatient Medications:    Vilazodone  HCl (VIIBRYD ) 10 MG TABS, Take 1 tablet (10 mg total) by mouth daily for 7 days, THEN 2 tablets (20 mg total) daily for 23 days., Disp: 53 tablet, Rfl: 0 Social History   Socioeconomic History   Marital status: Single    Spouse name: Not on file   Number of children: 2   Years of education: Not on file   Highest education level: Not on file  Occupational History   Not on file  Tobacco Use   Smoking status: Never   Smokeless tobacco: Never  Vaping Use   Vaping status: Never Used  Substance and Sexual Activity   Alcohol use: No   Drug use: Yes    Frequency: 1.0  times per week    Types: Marijuana   Sexual activity: Not Currently    Birth control/protection: None  Other Topics Concern   Not on file  Social History Narrative   Not on file   Social Drivers of Health   Financial Resource Strain: Low Risk  (04/14/2019)   Overall Financial Resource Strain (CARDIA)    Difficulty of Paying Living Expenses: Not hard at all  Food Insecurity: No Food Insecurity (04/14/2019)   Hunger Vital Sign    Worried About Running Out of Food in the Last Year: Never true    Ran Out of Food in the Last Year: Never true  Transportation Needs: No Transportation Needs (04/14/2019)   PRAPARE - Administrator, Civil Service (Medical): No    Lack of Transportation (Non-Medical): No  Physical Activity: Not on file  Stress: Not on file  Social Connections: Not on  file  Intimate Partner Violence: Not on file   Family History  Problem Relation Age of Onset   Heart attack Father    Hypertension Mother    Hyperlipidemia Mother    COPD Maternal Grandmother    Cancer Paternal Grandmother    Diabetes Paternal Grandmother    COPD Paternal Grandmother    Down syndrome Maternal Aunt    Colon cancer Neg Hx     Objective: Office vital signs reviewed. BP 133/75   Pulse 79   Temp 98.3 F (36.8 C)   Ht 5' 6 (1.676 m)   Wt (!) 383 lb 4.8 oz (173.9 kg)   SpO2 95%   BMI 61.87 kg/m   Physical Examination:  General: Awake, alert, morbidly obese, No acute distress HEENT: Tearful.  No exophthalmos.  No goiter Cardio: regular rate and rhythm, S1S2 heard, no murmurs appreciated Pulm: clear to auscultation bilaterally, no wheezes, rhonchi or rales; normal work of breathing on room air Psych: Tearful, anxious appearing     04/08/2024    3:35 PM 06/17/2023   11:58 AM 06/17/2023   11:46 AM  Depression screen PHQ 2/9  Decreased Interest 3 3 0  Down, Depressed, Hopeless 3 3 0  PHQ - 2 Score 6 6 0  Altered sleeping 3 1   Tired, decreased energy 3 3   Change in appetite 3 3   Feeling bad or failure about yourself  3 3   Trouble concentrating 0 3   Moving slowly or fidgety/restless 0 1   Suicidal thoughts 0 0   PHQ-9 Score 18 20   Difficult doing work/chores Very difficult Extremely dIfficult       04/08/2024    3:36 PM 06/17/2023   11:59 AM 03/07/2022    4:27 PM 02/25/2022    9:29 AM  GAD 7 : Generalized Anxiety Score  Nervous, Anxious, on Edge 3 3 3 3   Control/stop worrying 3 3 3 3   Worry too much - different things 3 3 3 3   Trouble relaxing 3 3 3 1   Restless 0 3 0 1  Easily annoyed or irritable 3 3 3 3   Afraid - awful might happen 0 1 0 1  Total GAD 7 Score 15 19 15 15   Anxiety Difficulty Very difficult Very difficult Somewhat difficult    Mood questionnaire:      Assessment/ Plan: 33 y.o. female   Assessment & Plan   Moderate  episode of recurrent major depressive disorder (HCC) - Plan: AMB Referral VBCI Care Management, Ambulatory referral to Integrated Behavioral Health, Vilazodone  HCl (VIIBRYD ) 10  MG TABS, CMP14+EGFR, TSH + free T4  Generalized anxiety disorder - Plan: AMB Referral [facility] Care Management, Ambulatory referral to Integrated Behavioral Health, Vilazodone  HCl (VIIBRYD ) 10 MG TABS, CMP14+EGFR, TSH + free T4  Stress - Plan: AMB Referral VBCI Care Management, Ambulatory referral to Integrated Behavioral Health, CMP14+EGFR, TSH + free T4  Housing insecurity - Plan: AMB Referral VBCI Care Management, Ambulatory referral to Integrated Behavioral Health  Morbid obesity (HCC) - Plan: Bayer DCA Hb A1c Waived, CMP14+EGFR, TSH + free T4  Weight gain finding - Plan: Bayer DCA Hb A1c Waived, CMP14+EGFR, TSH + free T4  I have referred her to the [facility] in efforts to help with housing insecurity and other social stressors.  Stat referral to integrated behavioral health and she should be seen on Tuesday.  She wished to defer her labs until Tuesday for that appointment I think that is fine  Given concerns of possible underlying bipolar depression, I am going to trial her on Viibryd  as she has been having refractory and/or intolerance symptoms in the SSRI class of medications.  I considered Pristiq and That could be revisited if she is not finding improvement with Viibryd .  Trintellix was not covered by insurance.  I will like her to have close follow-up with PCP in about 6 weeks for recheck.  She was given a handout with the National suicide hotline number, [facility] crisis hotline number and [place] crisis hotline number.  Though she demonstrated no SI or HI  Additionally, she admitted to some substance use with THC and excess alcohol use and I think that at some point this really needs to be addressed more thoroughly.  I wonder if she might benefit from something like gabapentin  to help remission from alcohol.   Will defer this to discussion with PCP in a couple of weeks  Patient and/or legal guardian verbally consented to [facility] services about presenting concerns and psychiatric consultation as appropriate.  The services will be billed as appropriate for the patient   Norene CHRISTELLA Fielding, DO [facility] [phone]

## 2024-04-12 ENCOUNTER — Ambulatory Visit: Payer: Self-pay | Admitting: Family Medicine

## 2024-04-12 ENCOUNTER — Ambulatory Visit: Admitting: Professional Counselor

## 2024-04-12 ENCOUNTER — Other Ambulatory Visit

## 2024-04-12 ENCOUNTER — Other Ambulatory Visit: Payer: Self-pay | Admitting: Licensed Clinical Social Worker

## 2024-04-12 DIAGNOSIS — F331 Major depressive disorder, recurrent, moderate: Secondary | ICD-10-CM

## 2024-04-12 DIAGNOSIS — F439 Reaction to severe stress, unspecified: Secondary | ICD-10-CM

## 2024-04-12 DIAGNOSIS — F411 Generalized anxiety disorder: Secondary | ICD-10-CM | POA: Diagnosis not present

## 2024-04-12 DIAGNOSIS — R635 Abnormal weight gain: Secondary | ICD-10-CM | POA: Diagnosis not present

## 2024-04-12 LAB — BAYER DCA HB A1C WAIVED: HB A1C (BAYER DCA - WAIVED): 5.4 % (ref 4.8–5.6)

## 2024-04-12 NOTE — Patient Outreach (Unsigned)
 Complex Care Management   Visit Note  04/12/2024  Name:  Sierra Heath MRN: [id number] DOB: 1990/09/13  Situation: Referral received for Complex Care Management related to {Criteria:32550} I obtained verbal consent from {CHL AMB Patient/Caregiver:28184}.  Visit completed with {CHL AMB Patient/Caregiver:28184}  {VISIT LOCATION:32553}  Background:   Past Medical History:  Diagnosis Date   Acid reflux    ADHD    Anxiety    Back pain, chronic    BV (bacterial vaginosis) 09/11/2014   Contraceptive management 09/11/2014   Depression    Diarrhea 10/11/2014   Hemorrhoids 10/03/2014   History of cesarean section 12/25/2015   History of chlamydia    Migraine    Nausea 10/11/2014   Obesity    Pregnant 12/25/2015   Rectal fissure 10/03/2014   Sleep apnea    Sleep apnea    Vaginal discharge 09/11/2014    Assessment: Patient Reported Symptoms:  Cognitive Cognitive Status: Able to follow simple commands, Alert and oriented to person, place, and time Cognitive/Intellectual Conditions Management [RPT]: None reported or documented in medical history or problem list   Health Maintenance Behaviors: Annual physical exam, Stress management Healing Pattern: Average Health Facilitated by: Rest, Stress management  Neurological      HEENT        Cardiovascular      Respiratory      Endocrine      Gastrointestinal        Genitourinary      Integumentary      Musculoskeletal          Psychosocial Psychosocial Symptoms Reported: Depression - if selected complete PHQ 2-9, Anxiety - if selected complete GAD Behavioral Management Strategies: Medication therapy Behavioral Health Self-Management Outcome: 3 (uncertain) Major Change/Loss/Stressor/Fears (CP): Resources Techniques to [employer] with Loss/Stress/Change: Counseling, Medication Do you feel physically threatened by others?: No    04/12/2024    PHQ2-9 Depression Screening   Little interest or pleasure in doing things    Feeling  down, depressed, or hopeless    PHQ-2 - Total Score    Trouble falling or staying asleep, or sleeping too much    Feeling tired or having little energy    Poor appetite or overeating     Feeling bad about yourself - or that you are a failure or have let yourself or your family down    Trouble concentrating on things, such as reading the newspaper or watching television    Moving or speaking so slowly that other people could have noticed.  Or the opposite - being so fidgety or restless that you have been moving around a lot more than usual    Thoughts that you would be better off dead, or hurting yourself in some way    PHQ2-9 Total Score    If you checked off any problems, how difficult have these problems made it for you to do your work, take care of things at home, or get along with other people    Depression Interventions/Treatment      There were no vitals filed for this visit.  Medications Reviewed Today   Medications were not reviewed in this encounter     Recommendation:   {RECOMMENDATONS:32554}  Follow Up Plan:   Referral to BSW  SIG ***
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
## 2024-04-13 ENCOUNTER — Telehealth: Payer: Self-pay

## 2024-04-13 ENCOUNTER — Telehealth

## 2024-04-13 LAB — CMP14+EGFR
ALT: 43 IU/L — ABNORMAL HIGH (ref 0–32)
AST: 53 IU/L — ABNORMAL HIGH (ref 0–40)
Albumin: 3.9 g/dL (ref 3.9–4.9)
Alkaline Phosphatase: 126 IU/L — ABNORMAL HIGH (ref 41–116)
BUN/Creatinine Ratio: 10 (ref 9–23)
BUN: 5 mg/dL — ABNORMAL LOW (ref 6–20)
Bilirubin Total: 0.8 mg/dL (ref 0.0–1.2)
CO2: 23 mmol/L (ref 20–29)
Calcium: 9.1 mg/dL (ref 8.7–10.2)
Chloride: 103 mmol/L (ref 96–106)
Creatinine, Ser: 0.51 mg/dL — ABNORMAL LOW (ref 0.57–1.00)
Globulin, Total: 2.1 g/dL (ref 1.5–4.5)
Glucose: 128 mg/dL — ABNORMAL HIGH (ref 70–99)
Potassium: 4.3 mmol/L (ref 3.5–5.2)
Sodium: 141 mmol/L (ref 134–144)
Total Protein: 6 g/dL (ref 6.0–8.5)
eGFR: 126 mL/min/1.73 (ref >59)

## 2024-04-13 LAB — TSH+FREE T4
Free T4: 1.25 ng/dL (ref 0.82–1.77)
TSH: 1.59 u[IU]/mL (ref 0.450–4.500)

## 2024-04-13 NOTE — Patient Instructions (Signed)
 Visit Information  Thank you for taking time to visit with me today. Please don't hesitate to contact me if I can be of assistance to you before our next scheduled appointment.  Our next appointment is by telephone on 04/14/24 at 2 PM Please call the care guide team at [phone] if you need to cancel or reschedule your appointment.   Please call the Suicide and Crisis Lifeline: 988 call the [place]  National Suicide Prevention Lifeline: 6193924058 or TTY: (604) 423-2624 TTY 725-470-3819) to talk to a trained counselor call 1-800-273-TALK (toll free, 24 hour hotline) go to [facility] [address], [place] [phone]) call the [facility] Crisis Line: [phone] call 911 if you are experiencing a Mental Health or Behavioral Health Crisis or need someone to talk to.  Patient verbalizes understanding of instructions and care plan provided today and agrees to view in MyChart. Active MyChart status and patient understanding of how to access instructions and care plan via MyChart confirmed with patient.     Lyle Rung, BSW, MSW, LCSW Licensed Clinical Social Worker [employer]   [email or web address] Direct Dial : [phone]

## 2024-04-13 NOTE — Telephone Encounter (Signed)
 noted

## 2024-04-13 NOTE — Telephone Encounter (Signed)
 Copied from CRM #[id number]. Topic: Clinical - Lab/Test Results >> Apr 13, 2024 10:53 AM Delon DASEN wrote: Reason for CRM: read results verbatim, no questions, will discuss with Dr Dettinger and next appt

## 2024-04-14 ENCOUNTER — Other Ambulatory Visit: Payer: Self-pay

## 2024-04-14 NOTE — Patient Instructions (Signed)
 Visit Information  Thank you for taking time to visit with me today. Please don't hesitate to contact me if I can be of assistance to you before our next scheduled appointment.   Following is a copy of your care plan:   Goals Addressed             This Visit's Progress    COMPLETED: BSW VBCI Social Work Care Plan       Problems:   Surveyor, quantity Strain   CSW Clinical Goal(s):   Over the next NA years the Patient will will follow up with [employer], Child Support/[employer] and [employer] as directed by Social Work.  Interventions:  Social Determinants of Health in Patient with Anxiety: SDOH assessments completed: Financial Strain  Evaluation of current treatment plan related to unmet needs Patient reports she has not had a source of income for years and is supported by others.  Patient has 2 children and has an appointment next week for child support.  Patient receives foodstamps.  Patient was left her previous address prior to eviction due to inability to pay bills once others moved out.  Patient is currently living with her grandmother and has no deadline to leave.  Patient has applied for income based housing and is on the waiting list.  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  SW also recommends patient apply with [employer].  Assessment is not completed and patient has to leave.  Patient Goals/Self-Care Activities:  Patient will work with Child Support, Psychologist, occupational and  Works.  Plan:   No further follow up required: Patient declines follow up.        Please call 911 if you are experiencing a Mental Health or Behavioral Health Crisis or need someone to talk to.  Patient verbalizes understanding of instructions and care plan provided today and agrees to view in MyChart. Active MyChart status and patient understanding of how to access instructions  and care plan via MyChart confirmed with patient.     Tillman Gardener, BSW   [facility], [facility] Social Worker Direct Dial : [phone]  Fax: [phone] Website: [email or web address]

## 2024-04-14 NOTE — Patient Outreach (Signed)
 Complex Care Management   Visit Note  04/14/2024  Name:  Sierra Heath MRN: [id number] DOB: 06-05-1991  Situation: Referral received for Complex Care Management related to SDOH Barriers:  Financial Resource Strain I obtained verbal consent from Patient.  Visit completed with Patient  on the phone  Background:   Past Medical History:  Diagnosis Date   Acid reflux    ADHD    Anxiety    Back pain, chronic    BV (bacterial vaginosis) 09/11/2014   Contraceptive management 09/11/2014   Depression    Diarrhea 10/11/2014   Hemorrhoids 10/03/2014   History of cesarean section 12/25/2015   History of chlamydia    Migraine    Nausea 10/11/2014   Obesity    Pregnant 12/25/2015   Rectal fissure 10/03/2014   Sleep apnea    Sleep apnea    Vaginal discharge 09/11/2014    Assessment:  Patient reports she has not had a source of income for years and is supported by others. Patient has 2 children and has an appointment next week for child support. Patient receives foodstamps. Patient was left her previous address prior to eviction due to inability to pay bills once others moved out. Patient is currently living with her grandmother and has no deadline to leave. Patient has applied for income based housing and is on the waiting list. 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 SW also recommends patient apply with [employer]. Assessment is not completed and patient has to leave.    Recommendation:   None  Follow Up Plan:   Patient has met all care management goals. Care Management case will be closed. Patient has been provided contact information should new needs arise.   Tillman Gardener, BSW [place]  [facility], [facility] Social Worker Direct Dial : [phone]  Fax: [phone] Website: [email or web address]

## 2024-04-15 ENCOUNTER — Telehealth: Payer: Self-pay | Admitting: Family Medicine

## 2024-04-15 NOTE — Telephone Encounter (Signed)
 Copied from CRM #[id number]. Topic: Clinical - Order For Equipment >> Apr 15, 2024 10:48 AM Harlene ORN wrote: Reason for CRM: Patient called requesting equipment for her CPAP machine. Please send prescription to the [facility] in [place].

## 2024-04-18 NOTE — Telephone Encounter (Signed)
 APT SCHEDULED 05/18/2024

## 2024-04-18 NOTE — Telephone Encounter (Signed)
 Please call and schedule a 20m appt for pt. She has not seen Dettinger since 02/2022. The supplies need to be discussed in an OV to have the appropriated documentation for insurance approval.   (2) same day appts can be used if needed.

## 2024-04-19 NOTE — Patient Instructions (Signed)
 If your symptoms worsen or you have thoughts of suicide/homicide, PLEASE SEEK IMMEDIATE MEDICAL ATTENTION.  You may always call:   National Suicide Hotline: 988 or 539 667 3577 [place] Crisis Line: 458 569 7915 Crisis Recovery in [place]: [phone]     These are available 24 hours a day, 7 days a week.

## 2024-04-21 ENCOUNTER — Telehealth (INDEPENDENT_AMBULATORY_CARE_PROVIDER_SITE_OTHER): Payer: Self-pay | Admitting: Professional Counselor

## 2024-04-21 DIAGNOSIS — F331 Major depressive disorder, recurrent, moderate: Secondary | ICD-10-CM
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
## 2024-04-25 ENCOUNTER — Encounter: Payer: Self-pay | Admitting: Licensed Clinical Social Worker

## 2024-04-25 ENCOUNTER — Telehealth: Payer: Self-pay | Admitting: Licensed Clinical Social Worker

## 2024-04-25 NOTE — Patient Instructions (Signed)
 [name] - I am sorry I was unable to reach you today. I work with Dettinger, Fonda LABOR, MD and am calling to support your healthcare needs. Please contact me at [phone] at your earliest convenience. I look forward to speaking with you soon.   Thank you,  Cena Ligas, LCSW Clinical Social Worker [facility]

## 2024-04-26 ENCOUNTER — Ambulatory Visit: Admitting: Professional Counselor

## 2024-05-02 ENCOUNTER — Ambulatory Visit: Admitting: Family Medicine

## 2024-05-03 ENCOUNTER — Other Ambulatory Visit: Payer: Self-pay | Admitting: Licensed Clinical Social Worker

## 2024-05-05 ENCOUNTER — Ambulatory Visit (INDEPENDENT_AMBULATORY_CARE_PROVIDER_SITE_OTHER): Admitting: Family Medicine

## 2024-05-05 ENCOUNTER — Encounter: Payer: Self-pay | Admitting: Family Medicine

## 2024-05-05 VITALS — BP 127/72 | HR 83 | Ht 66.0 in | Wt 384.0 lb

## 2024-05-05 DIAGNOSIS — F339 Major depressive disorder, recurrent, unspecified: Secondary | ICD-10-CM

## 2024-05-05 DIAGNOSIS — F411 Generalized anxiety disorder: Secondary | ICD-10-CM

## 2024-05-05 DIAGNOSIS — F331 Major depressive disorder, recurrent, moderate: Secondary | ICD-10-CM | POA: Diagnosis not present

## 2024-05-05 DIAGNOSIS — G4733 Obstructive sleep apnea (adult) (pediatric): Secondary | ICD-10-CM

## 2024-05-05 MED ORDER — VILAZODONE HCL 20 MG PO TABS
20.0000 mg | ORAL_TABLET | Freq: Every day | ORAL | 1 refills | Status: AC
Start: 2024-05-05 — End: ?

## 2024-05-05 NOTE — Progress Notes (Signed)
 BP 127/72   Pulse 83   Ht 5' 6 (1.676 m)   Wt (!) 384 lb (174.2 kg)   SpO2 97%   BMI 61.98 kg/m    Subjective:   Patient ID: Sierra Heath, female    DOB: 12/24/1990, 33 y.o.   MRN: [id number]  HPI: Sierra Heath is a 33 y.o. female presenting on 05/05/2024 for No chief complaint on file.   Discussed the use of AI scribe software for clinical note transcription with the patient, who gave verbal consent to proceed.  History of Present Illness   Denim Start is a 33 year old female with depression and sleep apnea who presents for weight loss consultation and CPAP equipment renewal. She is accompanied by her five-year-old daughter.  Depressive symptoms - Currently taking Viibryd , initially started at 10 mg and increased to 20 mg, taken at night due to gastrointestinal side effects in the morning - No thoughts of self-harm or suicide - Easily irritable, especially with her children, attributed in part to previous living situation  Obesity and weight management - Difficulty losing weight independently - Significant limitations in physical activity due to breathlessness - No weight gain or loss since last visit - Identifies sugary cocktails as a dietary issue and acknowledges need to reduce sugar intake - Seeking assistance with weight loss  Obstructive sleep apnea - Diagnosed as mild in 2016, with 17 apneic episodes per hour - Uses CPAP machine - Requires new CPAP supplies due to expired prescription  Functional limitations and barriers to care - Transportation challenges and lack of access to weight loss clinics - Currently living with grandmother after eviction, working on securing own housing - Needs driver's license and glasses, recently visited eye doctor           05/05/2024   11:11 AM 04/12/2024   10:42 AM 04/08/2024    3:35 PM 06/17/2023   11:58 AM 06/17/2023   11:46 AM  Depression screen PHQ 2/9  Decreased Interest 1 3 3 3  0  Down,  Depressed, Hopeless 0 3 3 3  0  PHQ - 2 Score 1 6 6 6  0  Altered sleeping 0 3 3 1    Tired, decreased energy 0 3 3 3    Change in appetite 0  3 3   Feeling bad or failure about yourself  0 3 3 3    Trouble concentrating 0 3 0 3   Moving slowly or fidgety/restless 0 0 0 1   Suicidal thoughts 0 0 0 0   PHQ-9 Score 1 18 18 20    Difficult doing work/chores Not difficult at all Extremely dIfficult Very difficult Extremely dIfficult       Relevant past medical, surgical, family and social history reviewed and updated as indicated. Interim medical history since our last visit reviewed. Allergies and medications reviewed and updated.  Review of Systems  Constitutional:  Negative for chills and fever.  Eyes:  Negative for visual disturbance.  Respiratory:  Negative for chest tightness and shortness of breath.   Cardiovascular:  Negative for chest pain and leg swelling.  Genitourinary:  Negative for difficulty urinating and dysuria.  Skin:  Negative for rash.  Neurological:  Negative for dizziness, light-headedness and headaches.  Psychiatric/Behavioral:  Positive for dysphoric mood and sleep disturbance. Negative for agitation, behavioral problems, self-injury and suicidal ideas. The patient is nervous/anxious.   All other systems reviewed and are negative.   Per HPI unless specifically indicated above   Allergies as of  05/05/2024       Reactions   Hydrocodone  Hives        Medication List        Accurate as of May 05, 2024 11:31 AM. If you have any questions, ask your nurse or doctor.          Vilazodone  HCl 20 MG Tabs Take 1 tablet (20 mg total) by mouth daily. What changed:  medication strength See the new instructions. Changed by: Fonda LABOR Oriya Kettering               Durable Medical Equipment  (From admission, onward)           Start     Ordered   05/05/24 0000  For home use only DME continuous positive airway pressure (CPAP)       Question Answer  Comment  Length of Need Lifetime   Patient has OSA or probable OSA Yes   Is the patient currently using CPAP in the home Yes   Settings 11-15   Signs and symptoms of probable OSA  (select all that apply) Witnessed apneas   CPAP supplies needed Mask, headgear, cushions, filters, heated tubing and water  chamber      05/05/24 1130             Objective:   BP 127/72   Pulse 83   Ht 5' 6 (1.676 m)   Wt (!) 384 lb (174.2 kg)   SpO2 97%   BMI 61.98 kg/m   Wt Readings from Last 3 Encounters:  05/05/24 (!) 384 lb (174.2 kg)  04/08/24 (!) 383 lb 4.8 oz (173.9 kg)  06/17/23 (!) 353 lb 12.8 oz (160.5 kg)    Physical Exam Physical Exam   CHEST: Lungs clear to auscultation bilaterally. CARDIOVASCULAR: Heart sounds normal.         Assessment & Plan:   Problem List Items Addressed This Visit       Respiratory   Sleep apnea - Primary   Relevant Orders   Ambulatory referral to Pulmonology   For home use only DME continuous positive airway pressure (CPAP)     Other   Morbid obesity (HCC)   Depression, recurrent   Relevant Medications   Vilazodone  HCl 20 MG TABS   Other Visit Diagnoses       Moderate episode of recurrent major depressive disorder (HCC)       Relevant Medications   Vilazodone  HCl 20 MG TABS     Generalized anxiety disorder       Relevant Medications   Vilazodone  HCl 20 MG TABS          Obesity and weight management Obesity with shortness of breath on minimal activity. Interested in weight loss medications pending sleep apnea reassessment. - Refer to sleep study to reassess sleep apnea severity. - Encourage reduction of sugary drinks and alcohol. - Discuss potential weight loss medications if sleep study shows moderate sleep apnea.  Obstructive sleep apnea Mild sleep apnea diagnosed in 2016, possible worsening. CPAP prescription expired. - Refer to sleep study to reassess severity of sleep apnea. - Renew prescription for CPAP supplies including  tubes and masks.  Alcohol use disorder Consumes high-sugar cocktails, acknowledges issue. Missed therapy due to not attending AA. - Encourage attendance at [employer] - Encourage continued therapy sessions.  Major depressive disorder On Viibryd  20 mg with symptom improvement. Irritability present, no suicidal ideation. - Continue Viibryd  20 mg at night. - Follow up in 1-2 months to reassess symptoms  and medication efficacy.          Follow up plan: Return if symptoms worsen or fail to improve, for 1 to 50-month follow-up, fasting labs and depression recheck.  Counseling provided for all of the vaccine components Orders Placed This Encounter  Procedures   For home use only DME continuous positive airway pressure (CPAP)   Ambulatory referral to Pulmonology    Fonda Levins, MD [facility] 05/05/2024, 11:31 AM

## 2024-05-12 ENCOUNTER — Telehealth: Payer: Self-pay | Admitting: *Deleted

## 2024-05-12 NOTE — Progress Notes (Signed)
 Complex Care Management Care Guide Note  05/12/2024 Name: [name] MRN: [id number] DOB: [birth year]  [name] is a 33 y.o. year old female who is a primary care patient of Dettinger, Fonda LABOR, MD and is actively engaged with the care management team. I reached out to [name] by phone today to assist with re-scheduling  with the Licensed Clinical Child psychotherapist. No further outreach attempts will be made at this time. We have been unable to contact the patient to reschedule for complex care management services.   Follow up plan: None. Patient declines further follow up with care management team.   Harlene Satterfield  [facility]  [facility], [facility] Guide  Direct Dial : [phone]  Fax [phone]

## 2024-05-18 ENCOUNTER — Ambulatory Visit: Admitting: Family Medicine

## 2024-06-29 ENCOUNTER — Ambulatory Visit (HOSPITAL_BASED_OUTPATIENT_CLINIC_OR_DEPARTMENT_OTHER)

## 2024-07-04 ENCOUNTER — Encounter (HOSPITAL_BASED_OUTPATIENT_CLINIC_OR_DEPARTMENT_OTHER): Payer: Self-pay

## 2024-07-04 ENCOUNTER — Telehealth (HOSPITAL_BASED_OUTPATIENT_CLINIC_OR_DEPARTMENT_OTHER): Payer: Self-pay

## 2024-07-04 ENCOUNTER — Ambulatory Visit (INDEPENDENT_AMBULATORY_CARE_PROVIDER_SITE_OTHER)

## 2024-07-04 VITALS — BP 154/96 | HR 102 | Ht 66.0 in | Wt >= 6400 oz

## 2024-07-04 DIAGNOSIS — G4733 Obstructive sleep apnea (adult) (pediatric): Secondary | ICD-10-CM | POA: Diagnosis not present

## 2024-07-04 DIAGNOSIS — Z6841 Body Mass Index (BMI) 40.0 and over, adult: Secondary | ICD-10-CM | POA: Diagnosis not present

## 2024-07-04 MED ORDER — ZEPBOUND 5 MG/0.5ML ~~LOC~~ SOAJ: 5.0000 mg | SUBCUTANEOUS | 1 refills | Status: AC

## 2024-07-04 MED ORDER — ZEPBOUND 2.5 MG/0.5ML ~~LOC~~ SOAJ: 2.5000 mg | SUBCUTANEOUS | 1 refills | Status: AC

## 2024-07-04 MED ORDER — ZEPBOUND 7.5 MG/0.5ML ~~LOC~~ SOAJ: 7.5000 mg | SUBCUTANEOUS | 1 refills | Status: AC

## 2024-07-04 NOTE — Telephone Encounter (Unsigned)
 Copied from CRM [id number]. Topic: Clinical - Prescription Issue >> Jul 04, 2024 10:38 AM Russell PARAS wrote: Reason for CRM:   Pt was advised by pharmacy that her insurance will not cover the prescribed Zepbound , will be $2500/month.  Requesting call back to discuss either submitting a PA or sending in order for alternative med.  CB#  [phone]   OR  [phone]

## 2024-07-04 NOTE — Assessment & Plan Note (Signed)
 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 Long-term CPAP use with good compliance.  - Consider in-lab sleep study due to complexity of mixed obstructive/central apnea. - Continue CPAP therapy; review compliance download at next appt.  Morbid obesity due to excess calories Significant weight gain of approximately 100 pounds since childbirth. Contributing factors include late-night eating, alcohol consumption, and depression. Shortness of breath with activity likely exacerbated by weight gain. - Prescribed Zepbound  (GLP-1 agonist) for weight loss. - Advised on dietary modifications: front-load meals earlier in the day, reduce evening intake. - Encouraged gradual lifestyle changes to improve weight management including limiting alcohol consumption  Elevated LFTs Elevated liver function tests possibly due to weight gain and alcohol consumption. - Advised on reducing alcohol intake, especially at night. - Encouraged dietary changes to support liver health.

## 2024-07-04 NOTE — Patient Instructions (Addendum)
 Complete screening pregnancy test.     Notify your provider if you are planning to have a procedure/surgery, as this medication will need to be stopped prior.

## 2024-07-04 NOTE — Progress Notes (Signed)
 @Patient  ID: Sierra Heath, female    DOB: [birth year], 33 y.o.   MRN: [id number]  Chief Complaint  Patient presents with   Establish Care    Referring provider: Dettinger, Fonda LABOR, MD  HPI: Discussed the use of AI scribe software for clinical note transcription with the patient, who gave verbal consent to proceed.  History of Present Illness Sierra Heath is a 33 year old female with sleep apnea who presents for evaluation of weight gain and sleep study results.  She has experienced significant weight gain, approximately 100 pounds, over several years, particularly after her last pregnancy five years ago. She attributes this to depression and poor eating habits, including late-night eating. Her thyroid  has been ruled out as a cause. She previously discontinued depression medication due to stomach discomfort.  Diagnosed with sleep apnea at age 7, she has been using a CPAP machine for 20 years. Her last sleep study in 2016 showed moderate obstructive and central sleep apnea with nearly 20 events per hour. She uses her CPAP machine nightly and receives new equipment every three to six months. She does not track compliance data but reports consistent use and daily benefit. No daytime sleepiness due to effective CPAP use.  She experiences shortness of breath with activity, which she attributes to her weight. She consumes 'cocktails in a can' at night, leading to extended sleep durations of up to 12 hours. She is aware that this may contribute to her weight gain.  She is currently living with her grandmother, and she faces significant physical limitations due to her weight, including difficulty carrying laundry and performing daily activities. She is concerned about her health in relation to her children and is motivated to improve her health for their sake.   TEST/EVENTS : Nocturnal PSG 12/27/14:  AHI 19.4/hr, O2 sat nadir 80% Moderate OSA; mixed obstructive and central with REM  AHI 51.4/hr  Allergies[1]  Immunization History  Administered Date(s) Administered   DTaP 03/22/1991, 06/07/1991, 08/15/1991, 04/17/1992, 11/10/1995   HIB (PRP-OMP) 03/22/1991, 06/07/1991, 08/15/1991, 04/17/1992   Hepatitis B 09/24/2006, 10/26/2006, 05/13/2007   Hpv-Unspecified 02/15/2007, 04/19/2007, 08/19/2007   IPV 03/22/1991, 06/07/1991, 04/17/1992, 11/10/1995   Influenza,inj,Quad PF,6+ Mos 04/09/2016, 10/27/2018   MMR 04/17/1992, 11/10/1995, 08/18/2016, 04/28/2019   PPD Test 12/01/2016, 12/06/2018, 09/26/2020   Td 08/20/2006, 06/27/2016   Tdap 08/20/2006, 04/21/2012, 06/27/2016, 02/22/2019    Past Medical History:  Diagnosis Date   Acid reflux    ADHD    Anxiety    Back pain, chronic    BV (bacterial vaginosis) 09/11/2014   Contraceptive management 09/11/2014   Depression    Diarrhea 10/11/2014   Hemorrhoids 10/03/2014   History of cesarean section 12/25/2015   History of chlamydia    Migraine    Nausea 10/11/2014   Obesity    Pregnant 12/25/2015   Rectal fissure 10/03/2014   Sleep apnea    Sleep apnea    Vaginal discharge 09/11/2014    Tobacco History: Tobacco Use History[2] Counseling given: Not Answered   Outpatient Medications Prior to Visit  Medication Sig Dispense Refill   Vilazodone  HCl 20 MG TABS Take 1 tablet (20 mg total) by mouth daily. 90 tablet 1   No facility-administered medications prior to visit.     Review of Systems: as per hpi  Constitutional:   No  weight loss, night sweats,  Fevers, chills, fatigue, or  lassitude.  HEENT:   No headaches,  Difficulty swallowing,  Tooth/dental problems, or  Sore throat,  No sneezing, itching, ear ache, nasal congestion, post nasal drip,   CV:  No chest pain,  Orthopnea, PND, swelling in lower extremities, anasarca, dizziness, palpitations, syncope.   GI  No heartburn, indigestion, abdominal pain, nausea, vomiting, diarrhea, change in bowel habits, loss of appetite, bloody stools.   Resp: No  shortness of breath with exertion or at rest.  No excess mucus, no productive cough,  No non-productive cough,  No coughing up of blood.  No change in color of mucus.  No wheezing.  No chest wall deformity  Skin: no rash or lesions.  GU: no dysuria, change in color of urine, no urgency or frequency.  No flank pain, no hematuria   MS:  No joint pain or swelling.  No decreased range of motion.  No back pain.    Physical Exam  BP (!) 154/96   Pulse (!) 102   Ht 5' 6 (1.676 m)   Wt (!) 401 lb (181.9 kg)   SpO2 97%   BMI 64.72 kg/m   GEN: A/Ox3; pleasant but tearful, NAD, well nourished    HEENT:  [place]/AT,  EACs-clear, TMs-wnl, NOSE-clear, THROAT-clear, no lesions, no postnasal drip or exudate noted.   NECK:  Supple w/ fair ROM; no JVD; normal carotid impulses w/o bruits; no thyromegaly or nodules palpated; no lymphadenopathy.    RESP  Clear  P & A; w/o, wheezes/ rales/ or rhonchi. no accessory muscle use, no dullness to percussion  CARD:  RRR, no m/r/g, no peripheral edema, pulses intact, no cyanosis or clubbing.  GI:   obese, soft & nt; nml bowel sounds; no organomegaly or masses detected.   Musco: Warm bil, no deformities or joint swelling noted.   Neuro: alert, no focal deficits noted.    Skin: Warm, no lesions or rashes    Lab Results:  CBC    Component Value Date/Time   WBC 7.3 04/28/2019 0450   RBC 3.31 (L) 04/28/2019 0450   HGB 7.6 (L) 04/28/2019 0450   HGB 9.5 (L) 03/29/2019 1518   HCT 25.8 (L) 04/28/2019 0450   HCT 29.6 (L) 03/29/2019 1518   PLT 209 04/28/2019 0450   PLT 203 03/29/2019 1518   MCV 77.9 (L) 04/28/2019 0450   MCV 77 (L) 03/29/2019 1518   MCH 23.0 (L) 04/28/2019 0450   MCHC 29.5 (L) 04/28/2019 0450   RDW 16.5 (H) 04/28/2019 0450   RDW 15.9 (H) 03/29/2019 1518   LYMPHSABS 1.5 10/27/2018 1024   MONOABS 0.5 08/18/2016 0619   EOSABS 0.0 10/27/2018 1024   BASOSABS 0.0 10/27/2018 1024    BMET    Component Value Date/Time   NA 141  04/12/2024 1115   K 4.3 04/12/2024 1115   CL 103 04/12/2024 1115   CO2 23 04/12/2024 1115   GLUCOSE 128 (H) 04/12/2024 1115   GLUCOSE 122 (H) 08/21/2010 0445   BUN 5 (L) 04/12/2024 1115   CREATININE 0.51 (L) 04/12/2024 1115   CALCIUM 9.1 04/12/2024 1115   GFRNONAA >60 04/27/2019 1515   GFRAA >60 04/27/2019 1515    BNP No results found for: BNP  ProBNP No results found for: PROBNP  Imaging: No results found.  Administration History     None           No data to display          No results found for: NITRICOXIDE   Assessment & Plan:   Assessment & Plan OSA (obstructive sleep apnea) Assessment and Plan Assessment & Plan Obstructive  and central sleep apnea Moderate obstructive and central sleep apnea with 20 events per hour on sleep study from 2016. Long-term CPAP use with good compliance.  - Consider in-lab sleep study due to complexity of mixed obstructive/central apnea. - Continue CPAP therapy; review compliance download at next appt.  Morbid obesity due to excess calories Significant weight gain of approximately 100 pounds since childbirth. Contributing factors include late-night eating, alcohol consumption, and depression. Shortness of breath with activity likely exacerbated by weight gain. - Prescribed Zepbound  (GLP-1 agonist) for weight loss. - Advised on dietary modifications: front-load meals earlier in the day, reduce evening intake. - Encouraged gradual lifestyle changes to improve weight management including limiting alcohol consumption  Elevated LFTs Elevated liver function tests possibly due to weight gain and alcohol consumption. - Advised on reducing alcohol intake, especially at night. - Encouraged dietary changes to support liver health.   Morbid obesity (HCC)   Patient has moderate to severe  sleep apnea related to obesity with BMI >/30 (BMI 64.72) or is overweight with BMI >/27 , posing significant cardiovascular risks. Patient  has failed traditional weight loss measures with caloric deficit and consistent exercise of 150 min/week for >/6 months. Patient will be initiated on Zepbound  (tirzepatide ) for weight management. Zepbound  is the only pharmaceutical treatment approved for moderate-to-severe OSA in adults who are overweight (BMI >/27) or obese (BMI >/30). The patient will continue lifestyle modifications, including structured nutrition and physical activity as directed. No other GLP1 therapy will be used simultaneously at this time. The patient does not have any FDA labeled contraindications to this agent, including pregnancy, lactation, hx or family history of medullary thyroid  cancer, or multiple endocrine neoplasia type II. Side effect profile has been reviewed with patient. Aware of red flag symptoms to notify of immediately or seek emergency care, including severe nausea/vomiting, inability to pass bowels or gas, severe abdominal pain/tenderness, jaundice.    Return in about 2 months (around 09/04/2024) for zepbound .  Candis Dandy, PA-C 07/04/2024      [1]  Allergies Allergen Reactions   Hydrocodone  Hives  [2]  Social History Tobacco Use  Smoking Status Never  Smokeless Tobacco Never

## 2024-07-06 ENCOUNTER — Ambulatory Visit: Payer: Self-pay | Admitting: Family Medicine

## 2024-07-06 ENCOUNTER — Encounter: Payer: Self-pay | Admitting: Family Medicine

## 2024-07-11 ENCOUNTER — Other Ambulatory Visit: Payer: Self-pay | Admitting: Family Medicine

## 2024-07-18 MED ORDER — WEGOVY 0.25 MG/0.5ML ~~LOC~~ SOAJ: 0.2500 mg | SUBCUTANEOUS | 0 refills | Status: AC

## 2024-07-18 NOTE — Addendum Note (Signed)
 Addended by: TRUDY WARREN CROME on: 07/18/2024 01:17 PM   Modules accepted: Orders

## 2024-07-18 NOTE — Telephone Encounter (Signed)
Okay to send [facility]?

## 2024-07-18 NOTE — Telephone Encounter (Signed)
 Please advise if change is appropriate since [employer] is covered

## 2024-09-05 ENCOUNTER — Ambulatory Visit (HOSPITAL_BASED_OUTPATIENT_CLINIC_OR_DEPARTMENT_OTHER)

## 2024-10-13 ENCOUNTER — Ambulatory Visit: Admitting: Family Medicine
